# Patient Record
Sex: Female | Born: 1970
Health system: Southern US, Community
[De-identification: ages and names within clinical notes are randomized; demographics above are authoritative.]

## PROBLEM LIST (undated history)

## (undated) DIAGNOSIS — C50919 Malignant neoplasm of unspecified site of unspecified female breast: Secondary | ICD-10-CM

## (undated) DIAGNOSIS — Z9889 Other specified postprocedural states: Secondary | ICD-10-CM

## (undated) DIAGNOSIS — R112 Nausea with vomiting, unspecified: Secondary | ICD-10-CM

## (undated) DIAGNOSIS — T8859XA Other complications of anesthesia, initial encounter: Secondary | ICD-10-CM

## (undated) DIAGNOSIS — I10 Essential (primary) hypertension: Secondary | ICD-10-CM

## (undated) HISTORY — PX: MASTECTOMY: SHX3

## (undated) HISTORY — PX: CHOLECYSTECTOMY: SHX55

## (undated) HISTORY — DX: Malignant neoplasm of unspecified site of unspecified female breast: C50.919

---

## 1998-04-17 ENCOUNTER — Ambulatory Visit (HOSPITAL_COMMUNITY): Admission: RE | Admit: 1998-04-17 | Discharge: 1998-04-17 | Payer: Self-pay | Admitting: Obstetrics

## 1998-05-10 ENCOUNTER — Ambulatory Visit (HOSPITAL_COMMUNITY): Admission: RE | Admit: 1998-05-10 | Discharge: 1998-05-10 | Payer: Self-pay | Admitting: Obstetrics

## 1998-05-29 ENCOUNTER — Inpatient Hospital Stay (HOSPITAL_COMMUNITY): Admission: AD | Admit: 1998-05-29 | Discharge: 1998-05-29 | Payer: Self-pay | Admitting: Obstetrics

## 1998-07-24 ENCOUNTER — Inpatient Hospital Stay (HOSPITAL_COMMUNITY): Admission: AD | Admit: 1998-07-24 | Discharge: 1998-07-24 | Payer: Self-pay | Admitting: Obstetrics

## 1998-07-27 ENCOUNTER — Inpatient Hospital Stay (HOSPITAL_COMMUNITY): Admission: AD | Admit: 1998-07-27 | Discharge: 1998-07-29 | Payer: Self-pay | Admitting: Obstetrics

## 1998-08-01 ENCOUNTER — Inpatient Hospital Stay (HOSPITAL_COMMUNITY): Admission: AD | Admit: 1998-08-01 | Discharge: 1998-08-04 | Payer: Self-pay | Admitting: Obstetrics & Gynecology

## 1998-10-17 ENCOUNTER — Inpatient Hospital Stay (HOSPITAL_COMMUNITY): Admission: AD | Admit: 1998-10-17 | Discharge: 1998-10-17 | Payer: Self-pay | Admitting: Obstetrics & Gynecology

## 1998-11-05 ENCOUNTER — Encounter: Admission: RE | Admit: 1998-11-05 | Discharge: 1998-11-05 | Payer: Self-pay | Admitting: Obstetrics & Gynecology

## 1999-06-18 ENCOUNTER — Emergency Department (HOSPITAL_COMMUNITY): Admission: EM | Admit: 1999-06-18 | Discharge: 1999-06-18 | Payer: Self-pay | Admitting: Emergency Medicine

## 2001-09-24 ENCOUNTER — Emergency Department (HOSPITAL_COMMUNITY): Admission: EM | Admit: 2001-09-24 | Discharge: 2001-09-24 | Payer: Self-pay | Admitting: *Deleted

## 2001-10-03 ENCOUNTER — Emergency Department (HOSPITAL_COMMUNITY): Admission: EM | Admit: 2001-10-03 | Discharge: 2001-10-03 | Payer: Self-pay | Admitting: Emergency Medicine

## 2001-10-03 ENCOUNTER — Encounter: Payer: Self-pay | Admitting: Emergency Medicine

## 2002-03-09 ENCOUNTER — Inpatient Hospital Stay (HOSPITAL_COMMUNITY): Admission: AD | Admit: 2002-03-09 | Discharge: 2002-03-09 | Payer: Self-pay | Admitting: Obstetrics

## 2002-04-01 ENCOUNTER — Inpatient Hospital Stay (HOSPITAL_COMMUNITY): Admission: AD | Admit: 2002-04-01 | Discharge: 2002-04-03 | Payer: Self-pay | Admitting: *Deleted

## 2002-04-01 ENCOUNTER — Encounter: Payer: Self-pay | Admitting: *Deleted

## 2005-07-12 ENCOUNTER — Emergency Department (HOSPITAL_COMMUNITY): Admission: EM | Admit: 2005-07-12 | Discharge: 2005-07-12 | Payer: Self-pay | Admitting: Family Medicine

## 2005-10-21 ENCOUNTER — Emergency Department (HOSPITAL_COMMUNITY): Admission: EM | Admit: 2005-10-21 | Discharge: 2005-10-21 | Payer: Self-pay | Admitting: Emergency Medicine

## 2006-04-03 ENCOUNTER — Emergency Department (HOSPITAL_COMMUNITY): Admission: EM | Admit: 2006-04-03 | Discharge: 2006-04-03 | Payer: Self-pay | Admitting: Emergency Medicine

## 2006-10-11 ENCOUNTER — Inpatient Hospital Stay (HOSPITAL_COMMUNITY): Admission: AD | Admit: 2006-10-11 | Discharge: 2006-10-11 | Payer: Self-pay | Admitting: Obstetrics and Gynecology

## 2006-10-27 ENCOUNTER — Inpatient Hospital Stay (HOSPITAL_COMMUNITY): Admission: AD | Admit: 2006-10-27 | Discharge: 2006-10-27 | Payer: Self-pay | Admitting: Gynecology

## 2006-12-04 ENCOUNTER — Inpatient Hospital Stay (HOSPITAL_COMMUNITY): Admission: AD | Admit: 2006-12-04 | Discharge: 2006-12-04 | Payer: Self-pay | Admitting: Obstetrics

## 2007-03-07 ENCOUNTER — Inpatient Hospital Stay (HOSPITAL_COMMUNITY): Admission: AD | Admit: 2007-03-07 | Discharge: 2007-03-07 | Payer: Self-pay | Admitting: Obstetrics

## 2007-04-25 ENCOUNTER — Inpatient Hospital Stay (HOSPITAL_COMMUNITY): Admission: AD | Admit: 2007-04-25 | Discharge: 2007-04-26 | Payer: Self-pay | Admitting: Obstetrics

## 2007-05-21 ENCOUNTER — Inpatient Hospital Stay (HOSPITAL_COMMUNITY): Admission: AD | Admit: 2007-05-21 | Discharge: 2007-05-23 | Payer: Self-pay | Admitting: Obstetrics

## 2007-12-09 ENCOUNTER — Emergency Department (HOSPITAL_COMMUNITY): Admission: EM | Admit: 2007-12-09 | Discharge: 2007-12-09 | Payer: Self-pay | Admitting: Emergency Medicine

## 2008-01-05 ENCOUNTER — Ambulatory Visit: Payer: Self-pay | Admitting: Internal Medicine

## 2008-01-05 DIAGNOSIS — R079 Chest pain, unspecified: Secondary | ICD-10-CM | POA: Insufficient documentation

## 2008-01-05 LAB — CONVERTED CEMR LAB
Bilirubin Urine: NEGATIVE
Glucose, Urine, Semiquant: NEGATIVE
Ketones, urine, test strip: NEGATIVE
Nitrite: NEGATIVE
Protein, U semiquant: 30
Specific Gravity, Urine: >=1.03
Urobilinogen, UA: NEGATIVE
WBC Urine, dipstick: NEGATIVE
pH: 6

## 2008-01-08 LAB — CONVERTED CEMR LAB
BUN: 12 mg/dL (ref 6–23)
CO2: 23 meq/L (ref 19–32)
Calcium: 9.8 mg/dL (ref 8.4–10.5)
Chloride: 102 meq/L (ref 96–112)
Creatinine, Ser: 0.54 mg/dL (ref 0.40–1.20)
Glucose, Bld: 89 mg/dL (ref 70–99)
Potassium: 4.2 meq/L (ref 3.5–5.3)
Sodium: 139 meq/L (ref 135–145)
TSH: 1.147 microintl units/mL (ref 0.350–5.50)

## 2008-01-09 ENCOUNTER — Encounter (INDEPENDENT_AMBULATORY_CARE_PROVIDER_SITE_OTHER): Payer: Self-pay | Admitting: *Deleted

## 2008-01-19 ENCOUNTER — Ambulatory Visit: Payer: Self-pay | Admitting: Internal Medicine

## 2008-01-19 ENCOUNTER — Ambulatory Visit: Payer: Self-pay | Admitting: *Deleted

## 2008-02-02 ENCOUNTER — Ambulatory Visit: Payer: Self-pay | Admitting: Internal Medicine

## 2008-02-16 ENCOUNTER — Ambulatory Visit: Payer: Self-pay | Admitting: Internal Medicine

## 2008-03-19 ENCOUNTER — Ambulatory Visit: Payer: Self-pay | Admitting: Internal Medicine

## 2008-03-19 DIAGNOSIS — I10 Essential (primary) hypertension: Secondary | ICD-10-CM | POA: Insufficient documentation

## 2008-04-02 ENCOUNTER — Telehealth (INDEPENDENT_AMBULATORY_CARE_PROVIDER_SITE_OTHER): Payer: Self-pay | Admitting: Internal Medicine

## 2008-06-12 ENCOUNTER — Emergency Department (HOSPITAL_COMMUNITY): Admission: EM | Admit: 2008-06-12 | Discharge: 2008-06-12 | Payer: Self-pay | Admitting: Emergency Medicine

## 2008-06-13 ENCOUNTER — Telehealth (INDEPENDENT_AMBULATORY_CARE_PROVIDER_SITE_OTHER): Payer: Self-pay | Admitting: *Deleted

## 2008-06-14 ENCOUNTER — Ambulatory Visit: Payer: Self-pay | Admitting: Internal Medicine

## 2008-07-24 ENCOUNTER — Ambulatory Visit: Payer: Self-pay | Admitting: Internal Medicine

## 2008-07-24 DIAGNOSIS — R1013 Epigastric pain: Secondary | ICD-10-CM | POA: Insufficient documentation

## 2008-08-31 ENCOUNTER — Encounter (INDEPENDENT_AMBULATORY_CARE_PROVIDER_SITE_OTHER): Payer: Self-pay | Admitting: *Deleted

## 2008-12-11 ENCOUNTER — Telehealth (INDEPENDENT_AMBULATORY_CARE_PROVIDER_SITE_OTHER): Payer: Self-pay | Admitting: *Deleted

## 2008-12-24 ENCOUNTER — Ambulatory Visit: Payer: Self-pay | Admitting: Nurse Practitioner

## 2008-12-24 DIAGNOSIS — R519 Headache, unspecified: Secondary | ICD-10-CM | POA: Insufficient documentation

## 2008-12-24 DIAGNOSIS — R51 Headache: Secondary | ICD-10-CM

## 2009-01-17 ENCOUNTER — Ambulatory Visit: Payer: Self-pay | Admitting: Internal Medicine

## 2009-01-17 DIAGNOSIS — F341 Dysthymic disorder: Secondary | ICD-10-CM

## 2009-01-17 DIAGNOSIS — J019 Acute sinusitis, unspecified: Secondary | ICD-10-CM

## 2009-04-06 ENCOUNTER — Emergency Department (HOSPITAL_COMMUNITY): Admission: EM | Admit: 2009-04-06 | Discharge: 2009-04-06 | Payer: Self-pay | Admitting: Family Medicine

## 2009-04-15 ENCOUNTER — Emergency Department (HOSPITAL_COMMUNITY): Admission: EM | Admit: 2009-04-15 | Discharge: 2009-04-15 | Payer: Self-pay | Admitting: Emergency Medicine

## 2009-06-25 ENCOUNTER — Ambulatory Visit: Payer: Self-pay | Admitting: Nurse Practitioner

## 2009-06-25 DIAGNOSIS — L708 Other acne: Secondary | ICD-10-CM

## 2009-06-26 ENCOUNTER — Encounter (INDEPENDENT_AMBULATORY_CARE_PROVIDER_SITE_OTHER): Payer: Self-pay | Admitting: Nurse Practitioner

## 2009-06-26 DIAGNOSIS — R809 Proteinuria, unspecified: Secondary | ICD-10-CM

## 2009-06-26 LAB — CONVERTED CEMR LAB
ALT: 12 units/L (ref 0–35)
AST: 17 units/L (ref 0–37)
Albumin: 4.4 g/dL (ref 3.5–5.2)
Alkaline Phosphatase: 74 units/L (ref 39–117)
BUN: 10 mg/dL (ref 6–23)
Basophils Absolute: 0 10*3/uL (ref 0.0–0.1)
Basophils Relative: 1 % (ref 0–1)
CO2: 23 meq/L (ref 19–32)
Calcium: 9.5 mg/dL (ref 8.4–10.5)
Chloride: 105 meq/L (ref 96–112)
Cholesterol: 199 mg/dL (ref 0–200)
Creatinine, Ser: 0.66 mg/dL (ref 0.40–1.20)
Creatinine, Urine: 279.2 mg/dL
Eosinophils Absolute: 0 10*3/uL (ref 0.0–0.7)
Eosinophils Relative: 1 % (ref 0–5)
Glucose, Bld: 92 mg/dL (ref 70–99)
HCT: 42.9 % (ref 36.0–46.0)
HDL: 40 mg/dL (ref >39)
Hemoglobin: 13.8 g/dL (ref 12.0–15.0)
LDL Cholesterol: 128 mg/dL — ABNORMAL HIGH (ref 0–99)
Lymphocytes Relative: 40 % (ref 12–46)
Lymphs Abs: 2.5 10*3/uL (ref 0.7–4.0)
MCHC: 32.2 g/dL (ref 30.0–36.0)
MCV: 87.7 fL (ref 78.0–100.0)
Microalb Creat Ratio: 19.1 mg/g (ref 0.0–30.0)
Microalb, Ur: 5.34 mg/dL — ABNORMAL HIGH (ref 0.00–1.89)
Monocytes Absolute: 0.5 10*3/uL (ref 0.1–1.0)
Monocytes Relative: 7 % (ref 3–12)
Neutro Abs: 3.1 10*3/uL (ref 1.7–7.7)
Neutrophils Relative %: 51 % (ref 43–77)
Platelets: 331 10*3/uL (ref 150–400)
Potassium: 4.1 meq/L (ref 3.5–5.3)
RBC: 4.89 M/uL (ref 3.87–5.11)
RDW: 13.5 % (ref 11.5–15.5)
Sodium: 141 meq/L (ref 135–145)
TSH: 1.526 microintl units/mL (ref 0.350–4.500)
Total Bilirubin: 0.5 mg/dL (ref 0.3–1.2)
Total CHOL/HDL Ratio: 5
Total Protein: 7.4 g/dL (ref 6.0–8.3)
Triglycerides: 154 mg/dL — ABNORMAL HIGH (ref <150)
VLDL: 31 mg/dL (ref 0–40)
WBC: 6.1 10*3/uL (ref 4.0–10.5)

## 2009-07-10 ENCOUNTER — Ambulatory Visit: Payer: Self-pay | Admitting: Nurse Practitioner

## 2009-09-09 ENCOUNTER — Telehealth (INDEPENDENT_AMBULATORY_CARE_PROVIDER_SITE_OTHER): Payer: Self-pay | Admitting: Internal Medicine

## 2009-10-04 ENCOUNTER — Ambulatory Visit: Payer: Self-pay | Admitting: Internal Medicine

## 2009-10-04 DIAGNOSIS — L659 Nonscarring hair loss, unspecified: Secondary | ICD-10-CM | POA: Insufficient documentation

## 2009-10-04 DIAGNOSIS — R42 Dizziness and giddiness: Secondary | ICD-10-CM

## 2009-10-18 ENCOUNTER — Telehealth (INDEPENDENT_AMBULATORY_CARE_PROVIDER_SITE_OTHER): Payer: Self-pay | Admitting: Internal Medicine

## 2010-01-29 ENCOUNTER — Encounter (INDEPENDENT_AMBULATORY_CARE_PROVIDER_SITE_OTHER): Payer: Self-pay | Admitting: Internal Medicine

## 2010-11-30 ENCOUNTER — Inpatient Hospital Stay (HOSPITAL_COMMUNITY): Admission: EM | Admit: 2010-11-30 | Discharge: 2010-12-03 | Payer: Self-pay | Source: Home / Self Care

## 2010-12-01 LAB — COMPREHENSIVE METABOLIC PANEL
ALT: 822 U/L — ABNORMAL HIGH (ref 0–35)
AST: 1148 U/L — ABNORMAL HIGH (ref 0–37)
Albumin: 3.5 g/dL (ref 3.5–5.2)
Alkaline Phosphatase: 137 U/L — ABNORMAL HIGH (ref 39–117)
BUN: 10 mg/dL (ref 6–23)
CO2: 27 mEq/L (ref 19–32)
Calcium: 9.1 mg/dL (ref 8.4–10.5)
Chloride: 104 mEq/L (ref 96–112)
Creatinine, Ser: 0.73 mg/dL (ref 0.4–1.2)
GFR calc Af Amer: >60 mL/min (ref >60)
GFR calc non Af Amer: >60 mL/min (ref >60)
Glucose, Bld: 114 mg/dL — ABNORMAL HIGH (ref 70–99)
Potassium: 3.4 mEq/L — ABNORMAL LOW (ref 3.5–5.1)
Sodium: 139 mEq/L (ref 135–145)
Total Bilirubin: 2.4 mg/dL — ABNORMAL HIGH (ref 0.3–1.2)
Total Protein: 6.9 g/dL (ref 6.0–8.3)

## 2010-12-01 LAB — CBC
HCT: 37.4 % (ref 36.0–46.0)
Hemoglobin: 12.5 g/dL (ref 12.0–15.0)
MCH: 28.6 pg (ref 26.0–34.0)
MCHC: 33.4 g/dL (ref 30.0–36.0)
MCV: 85.6 fL (ref 78.0–100.0)
Platelets: 68 10*3/uL — ABNORMAL LOW (ref 150–400)
RBC: 4.37 MIL/uL (ref 3.87–5.11)
RDW: 12.6 % (ref 11.5–15.5)
WBC: 3.5 10*3/uL — ABNORMAL LOW (ref 4.0–10.5)

## 2010-12-01 LAB — DIFFERENTIAL
Basophils Absolute: 0 10*3/uL (ref 0.0–0.1)
Basophils Relative: 1 % (ref 0–1)
Eosinophils Absolute: 0.1 10*3/uL (ref 0.0–0.7)
Eosinophils Relative: 2 % (ref 0–5)
Lymphocytes Relative: 46 % (ref 12–46)
Lymphs Abs: 1.6 10*3/uL (ref 0.7–4.0)
Monocytes Absolute: 0.2 10*3/uL (ref 0.1–1.0)
Monocytes Relative: 5 % (ref 3–12)
Neutro Abs: 1.6 10*3/uL — ABNORMAL LOW (ref 1.7–7.7)
Neutrophils Relative %: 47 % (ref 43–77)

## 2010-12-01 LAB — LACTIC ACID, PLASMA: Lactic Acid, Venous: 1.1 mmol/L (ref 0.5–2.2)

## 2010-12-01 LAB — POCT PREGNANCY, URINE: Preg Test, Ur: NEGATIVE

## 2010-12-01 LAB — LIPASE, BLOOD: Lipase: 30 U/L (ref 11–59)

## 2010-12-02 ENCOUNTER — Encounter (INDEPENDENT_AMBULATORY_CARE_PROVIDER_SITE_OTHER): Payer: Self-pay

## 2010-12-03 LAB — URINALYSIS, ROUTINE W REFLEX MICROSCOPIC
Ketones, ur: NEGATIVE mg/dL
Leukocytes, UA: NEGATIVE
Nitrite: NEGATIVE
Protein, ur: NEGATIVE mg/dL
Specific Gravity, Urine: 1.01 (ref 1.005–1.030)
Urine Glucose, Fasting: NEGATIVE mg/dL
Urobilinogen, UA: 1 mg/dL (ref 0.0–1.0)
pH: 6.5 (ref 5.0–8.0)

## 2010-12-03 LAB — URINE MICROSCOPIC-ADD ON

## 2010-12-03 LAB — CBC
HCT: 36.5 % (ref 36.0–46.0)
Hemoglobin: 12.3 g/dL (ref 12.0–15.0)
MCH: 28.7 pg (ref 26.0–34.0)
MCHC: 33.7 g/dL (ref 30.0–36.0)
MCV: 85.1 fL (ref 78.0–100.0)
Platelets: 63 10*3/uL — ABNORMAL LOW (ref 150–400)
RBC: 4.29 MIL/uL (ref 3.87–5.11)
RDW: 12.8 % (ref 11.5–15.5)
WBC: 4 10*3/uL (ref 4.0–10.5)

## 2010-12-03 LAB — HEPATIC FUNCTION PANEL
ALT: 726 U/L — ABNORMAL HIGH (ref 0–35)
AST: 658 U/L — ABNORMAL HIGH (ref 0–37)
Albumin: 3.2 g/dL — ABNORMAL LOW (ref 3.5–5.2)
Alkaline Phosphatase: 145 U/L — ABNORMAL HIGH (ref 39–117)
Bilirubin, Direct: 0.6 mg/dL — ABNORMAL HIGH (ref 0.0–0.3)
Indirect Bilirubin: 0.9 mg/dL (ref 0.3–0.9)
Total Bilirubin: 1.5 mg/dL — ABNORMAL HIGH (ref 0.3–1.2)
Total Protein: 6.4 g/dL (ref 6.0–8.3)

## 2010-12-06 NOTE — H&P (Signed)
  NAMEHAVAH, AMMON NO.:  [id number]  MEDICAL RECORD NO.:  [id number]          PATIENT TYPE:  EMS  LOCATION:  MAJO                         FACILITY:  [facility]  PHYSICIAN:  Juanetta Gosling, MDDATE OF BIRTH:  November 22, 1970  DATE OF ADMISSION:  11/30/2010 DATE OF DISCHARGE:                             HISTORY & PHYSICAL   CHIEF COMPLAINT:  Epigastric pain.  HISTORY OF PRESENT ILLNESS:  This is a 39 year old female with about 24 hours of worsening epigastric pain associated with nausea, vomiting, no fevers.  This has not really relieved by anything.  She has not been eating so much over this time-frame.  It is aggravated by movement and palpation.  She has a history of occasional episodes over the past 3 years, but this one has been the most tense and the most long lasting. She has no history of any symptoms of jaundice.  PAST MEDICAL HISTORY:  Hypertension.  PAST SURGICAL HISTORY:  Negative. FAMILY HISTORY:  Noncontributory.  SOCIAL HISTORY:  She does not drink or smoke.  ALLERGIES:  She has no known drug allergies.  MEDICATIONS:  Losartan/hydrochlorothiazide.  REVIEW OF SYSTEMS:  Otherwise negative.  PHYSICAL EXAMINATION:  VITAL SIGNS:  Temperature 98.1, pulse initially was 113, now 77, respirations 16, and blood pressure 133/79. GENERAL:  She is a well-appearing female in no distress. HEENT:  No scleral icterus. NECK:  Supple without any adenopathy. HEART:  Regular rate and rhythm. LUNGS:  Clear bilaterally. ABDOMEN: Soft.  She has a very minimal epigastric tenderness.  No Murphy sign and no other abdominal tenderness and is nondistended.  LABORATORY DATA:  Laboratory evaluation shows an HCG that is negative. Lipase of 30.  CMP showing a BUN of 10, a creatinine of 0.73, a glucose of 114, and potassium 3.4.  Alkaline phosphatase 137, AST 1148, ALT of 822, and total bilirubin of 2.4.  Venous lactate is 1.1.  CBC shows a white blood cell count of  3.5, hematocrit 37.4, and platelets of 68.  RADIOLOGIC DATA:  Initially, she had a plain film that showed some mildly dilated loops of small bowel.  CT scan showed a small amount of pericholecystic fluid with no other real significant abnormalities except the left ovarian cyst.  Ultrasound of her right upper quadrant showed gallstones, gallbladder wall thickening of 5.4 mm, no sonographic Murphy sign, and a normal caliber common bile duct.  ASSESSMENT:  Choledocholithiasis.  PLAN:  Admission IV fluids n.p.o.  I am going to recheck her LFTs in the morning.  I have discussed with her if her LFTs are the same or it is increasing does make it appear that the stone has not passed and we will obtain a Gastroenterology consult for possible ERCP.  If her liver function tests are decreased, it appears that the stone is passed.  I discussed the laparoscopic cholecystectomy with her.     Juanetta Gosling, MD     MCW/MEDQ  D:  12/01/2010  T:  12/01/2010  Job:  [id number]  Electronically Signed by Emelia Loron MD on 12/06/2010 01:57:52 PM

## 2010-12-08 LAB — CBC
HCT: 33.1 % — ABNORMAL LOW (ref 36.0–46.0)
Hemoglobin: 11.1 g/dL — ABNORMAL LOW (ref 12.0–15.0)
MCH: 28.5 pg (ref 26.0–34.0)
MCHC: 33.5 g/dL (ref 30.0–36.0)
MCV: 85.1 fL (ref 78.0–100.0)
Platelets: 63 10*3/uL — ABNORMAL LOW (ref 150–400)
RBC: 3.89 MIL/uL (ref 3.87–5.11)
RDW: 12.7 % (ref 11.5–15.5)
WBC: 7.7 10*3/uL (ref 4.0–10.5)

## 2010-12-12 NOTE — Op Note (Signed)
Yvette Franklin, Yvette Franklin                 ACCOUNT NO.:  [id number]  MEDICAL RECORD NO.:  [id number]          PATIENT TYPE:  INP  LOCATION:  5118                         FACILITY:  [facility]  PHYSICIAN:  Cherylynn Ridges, M.D.    DATE OF BIRTH:  February 23, 1971  DATE OF PROCEDURE:  12/02/2010 DATE OF DISCHARGE:                              OPERATIVE REPORT   PREOPERATIVE DIAGNOSES:  Cholelithiasis, possible acute cholecystitis, and possible choledocholithiasis.  POSTOPERATIVE DIAGNOSES:  Acute cholecystitis and cholelithiasis.  PROCEDURE:  Laparoscopic cholecystectomy with cholangiogram.  SURGEON:  Cherylynn Ridges, MD  ASSISTANT:  Letha Cape, PA  ANESTHESIA:  General endotracheal.  ESTIMATED BLOOD LOSS:  50 mL.  COMPLICATIONS:  None.  CONDITION:  Stable.  FINDINGS:  Evidence of acute cholecystitis with dense omental adhesions to the gallbladder body and infundibulum.  INDICATIONS FOR OPERATION:  The patient is a 40 year old with abdominal pain, markedly elevated LFTs which had subsided over last 2 days who now comes in for a laparoscopic cholecystectomy.  OPERATION:  The patient was taken to the operating room, placed on table in supine position.  After an adequate general endotracheal anesthetic was administered, she was prepped and draped in usual sterile manner exposing the entire abdomen.  A supraumbilical midline incision was made using #15 blade and taken down to the midline fascia.  We grabbed the fascia with Kocher clamps and then made an incision between the clamps using a 15 blade into the preperitoneal space.  We then bluntly dissected through the preperitoneal space into the peritoneal cavity with a Kelly clamp, then passed a pursestring suture of 0 Vicryl around the fascial opening into the peritoneal cavity.  We had secured in a Hasson cannula which subsequently passed into the peritoneal cavity through which carbon dioxide gas was insufflated up to a maximal  intra-abdominal pressure of 15 mmHg.  Once this was done, 2 right costal margin 5-mm cannulas and the subxiphoid 5-mm cannula passed under direct vision.  The patient was placed in reverse Trendelenburg and left side was tilted down.  We grabbed the gallbladder initially on the dome, but there were some dense omental adhesions which had to be dissected away.  Most of the bleeding perioperatively was from these omental adhesions.  We used electrocautery with a [place] dissector to dissect away these adhesions.  However, there still was some bleeding at the end which subsided.  We were able to free up the infundibulum and the body of the gallbladder enough in order to dissect out the peritoneum overlying the triangle of Fallot and hepatic duodenal triangle dissecting out the cystic duct and the cystic artery.  Once we had the duct identified, we placed a clip along the gallbladder side, made a cholecystodochotomy using laparoscopic scissors, then performed cholangiogram using Cook catheter which had been passed through the anterior abdominal wall.  The cholangiogram showed good flow into the duodenum, no intraductal filling defects, no ductal dilatation, and good proximal flow.  Once the cholangiogram was completed, the clip securing the catheter in place was removed and we distally clipped the cystic duct x3 and transected it.  The  cystic artery was identified and clipped proximally and distally and also cauterized.  We dissected the gallbladder out of its bed with minimal difficulty removing it from the supraumbilical fascial site using an EndoCatch bag.  We reinserted the Hasson cannula and inspected the bed for bleeding. There was no bleeding from the bed but there was bleeding in the gallbladder fossa from the adhesions that had been taken down.  There was no pinpoint sign of bleeding.  We irrigated with about 3 liters of saline solution until the fluid was clear and again there  was no active bleeding noted.  Once this was completed and we had adequately irrigated and found there to be no bleeding, removed all cannulas, aspirated all fluid and gas from above the liver and a removed all cannulas.  The supraumbilical fascial site was closed using a pursestring suture which was in place.  A 0.25% Marcaine with epi was injected at all sites.  The three 5-mm cannula sites were closed using Dermabond, Steri- Strips, and Tegaderm.  The supraumbilical skin was closed using running subcuticular stitch of 4-0 Monocryl followed by Dermabond, Steri-Strips, and Tegaderm.  All needle count, sponge counts, and instrument counts were correct.     Cherylynn Ridges, M.D.     JOW/MEDQ  D:  12/02/2010  T:  12/03/2010  Job:  [id number]  Electronically Signed by Jimmye Norman M.D. on 12/10/2010 08:11:50 AM

## 2010-12-12 NOTE — Discharge Summary (Signed)
Yvette Franklin, Yvette Franklin                 ACCOUNT NO.:  [id number]  MEDICAL RECORD NO.:  [id number]           PATIENT TYPE:  LOCATION:                                 FACILITY:  PHYSICIAN:  Cherylynn Ridges, M.D.    DATE OF BIRTH:  [birth year]  DATE OF ADMISSION:  12/01/2010 DATE OF DISCHARGE:  12/03/2010                        DISCHARGE SUMMARY - REFERRING   ADMISSION DIAGNOSIS:  Choledocholithiasis with elevated LFTs.  DISCHARGE DIAGNOSES: 1. Acute cholecystitis with cholelithiasis. 2. Hypertension.  PROCEDURES: 1. CT of abdomen and pelvis December 01, 2010 showing pericholecystic     fluid and left ovarian cyst. 2. Gallbladder ultrasound showing gallstones and gallbladder wall     thickening up to 5.4 mm with positive Murphy sign. 3. Status post laparoscopic cholecystectomy and intraoperative     cholangiogram December 02, 2010.  BRIEF HISTORY:  The patient is a 40 year old lady from [place] with worsening epigastric pain and nausea and vomiting, fever.  Nothing has really relieved her symptoms.  She has not been eating much, is aggravated by movement and palpitation.  She has had episodes on and off over the last 3 years.  This one has been the most severe.  She has no history of jaundice.  PAST MEDICAL HISTORY:  Hypertension.  FAMILY HISTORY:  None.  SOCIAL HISTORY:  She does not drink or smoke.  ALLERGIES:  None known.  MEDICATIONS:  Losartan hydrochlorothiazide one daily.  For further history and physical, please see the admission note.  LABS ON ADMISSION:  Shows white count was normal 3.5, hemoglobin 12.5, hematocrit 37, platelets were 68,000.  Lactic acid was 1.1.  CMP showed electrolytes were normal except for potassium slightly diminished at 3.4, total bilirubin was 2.4, alk phos slightly elevated at 137.  SGOT was 1148, SGPT was 822, albumin and protein were normal.  Lipase was normal at 30.  Urinalysis was normal. Hospital Course:  The patient was placed  on antibiotics and LFTs the following day showed alk-phos at 145.  SGOT was down to 658, SGPT was 726.  She was taken to the operating room in the afternoon of December 02, 2010, underwent laparoscopic cholecystectomy without problems.  She had been maintained on antibiotics postop, was making good progress.  If she continues to improve, we anticipate discharge later today after lunch.  Her wounds were closed and healing nicely.  She is ambulating without difficulty. Discharge instructions:  She will go home on her preadmission medicines losartan/hydrochlorothiazide 100/12.5 one daily, plain Tylenol 650 mg q.6 hours p.r.n. for pain, Vicodin 5/325 1-2 tablets q.4 h p.r.n. for pain.  Patient instructed not to take more than 6 tablets per day. CBC was checked in PM at Dr. Lavella Lemons direction and was stable.  She was DC'ed in PM 12/03/10.  She will return to the clinic on December 16, 2010, at 2:45 p.m.  CONDITION ON DISCHARGE:  Improved.     Eber Hong, P.A.   ______________________________ Cherylynn Ridges, M.D.    WDJ/MEDQ  D:  12/03/2010  T:  12/03/2010  Job:  [id number]  cc:   Melvern Banker  Electronically Signed by  Sherrie George P.A. on 12/05/2010 03:06:17 PM Electronically Signed by Jimmye Norman M.D. on 12/10/2010 08:11:59 AM

## 2010-12-19 NOTE — Letter (Signed)
Summary: Internal Correspondence  Internal Correspondence   Imported By: Arta Bruce 03/05/2010 12:05:49  _____________________________________________________________________  External Attachment:    Type:   Image     Comment:   External Document

## 2011-01-12 ENCOUNTER — Encounter (INDEPENDENT_AMBULATORY_CARE_PROVIDER_SITE_OTHER): Payer: Self-pay | Admitting: Internal Medicine

## 2011-01-22 NOTE — Miscellaneous (Signed)
Summary: Cholecystectomy documentation.  Clinical Lists Changes  Observations: Added new observation of PAST SURG HX: 1.  12/02/2010:  laparoscopic cholecystectomy, Dr. Lindie Spruce. (01/12/2011 11:29)        Past History:  Past Surgical History: 1.  12/02/2010:  laparoscopic cholecystectomy, Dr. Lindie Spruce.

## 2011-06-21 ENCOUNTER — Emergency Department (HOSPITAL_COMMUNITY)
Admission: EM | Admit: 2011-06-21 | Discharge: 2011-06-21 | Disposition: A | Discharge status: Home / Self Care | Payer: Self-pay | Attending: Emergency Medicine | Admitting: Emergency Medicine

## 2011-06-21 DIAGNOSIS — K2970 Gastritis, unspecified, without bleeding: Secondary | ICD-10-CM | POA: Insufficient documentation

## 2011-06-21 DIAGNOSIS — R112 Nausea with vomiting, unspecified: Secondary | ICD-10-CM | POA: Insufficient documentation

## 2011-06-21 DIAGNOSIS — R10816 Epigastric abdominal tenderness: Secondary | ICD-10-CM | POA: Insufficient documentation

## 2011-06-21 DIAGNOSIS — R197 Diarrhea, unspecified: Secondary | ICD-10-CM | POA: Insufficient documentation

## 2011-06-21 DIAGNOSIS — R109 Unspecified abdominal pain: Secondary | ICD-10-CM | POA: Insufficient documentation

## 2011-06-21 DIAGNOSIS — I10 Essential (primary) hypertension: Secondary | ICD-10-CM | POA: Insufficient documentation

## 2011-06-21 DIAGNOSIS — Z79899 Other long term (current) drug therapy: Secondary | ICD-10-CM | POA: Insufficient documentation

## 2011-06-21 LAB — URINALYSIS, ROUTINE W REFLEX MICROSCOPIC
Bilirubin Urine: NEGATIVE
Glucose, UA: NEGATIVE mg/dL
Ketones, ur: NEGATIVE mg/dL
Nitrite: NEGATIVE
Protein, ur: NEGATIVE mg/dL
Specific Gravity, Urine: 1.034 — ABNORMAL HIGH (ref 1.005–1.030)
Urobilinogen, UA: 0.2 mg/dL (ref 0.0–1.0)
pH: 5.5 (ref 5.0–8.0)

## 2011-06-21 LAB — DIFFERENTIAL
Basophils Absolute: 0.1 10*3/uL (ref 0.0–0.1)
Basophils Relative: 1 % (ref 0–1)
Eosinophils Absolute: 0.5 10*3/uL (ref 0.0–0.7)
Eosinophils Relative: 7 % — ABNORMAL HIGH (ref 0–5)

## 2011-06-21 LAB — COMPREHENSIVE METABOLIC PANEL
AST: 68 U/L — ABNORMAL HIGH (ref 0–37)
Albumin: 3.9 g/dL (ref 3.5–5.2)
Chloride: 102 mEq/L (ref 96–112)
Creatinine, Ser: 0.7 mg/dL (ref 0.50–1.10)
Potassium: 4 mEq/L (ref 3.5–5.1)
Sodium: 139 mEq/L (ref 135–145)
Total Bilirubin: 0.2 mg/dL — ABNORMAL LOW (ref 0.3–1.2)

## 2011-06-21 LAB — CBC
MCV: 85.7 fL (ref 78.0–100.0)
Platelets: 163 10*3/uL (ref 150–400)
RDW: 12.5 % (ref 11.5–15.5)
WBC: 7.7 10*3/uL (ref 4.0–10.5)

## 2011-06-21 LAB — URINE MICROSCOPIC-ADD ON

## 2011-06-21 LAB — POCT PREGNANCY, URINE: Preg Test, Ur: NEGATIVE

## 2011-06-24 ENCOUNTER — Ambulatory Visit: Payer: Self-pay | Attending: Family Medicine | Admitting: Rehabilitation

## 2011-06-24 DIAGNOSIS — IMO0001 Reserved for inherently not codable concepts without codable children: Secondary | ICD-10-CM | POA: Insufficient documentation

## 2011-06-24 DIAGNOSIS — M542 Cervicalgia: Secondary | ICD-10-CM | POA: Insufficient documentation

## 2011-06-24 DIAGNOSIS — R293 Abnormal posture: Secondary | ICD-10-CM | POA: Insufficient documentation

## 2011-07-01 ENCOUNTER — Ambulatory Visit: Payer: Self-pay | Admitting: Physical Therapy

## 2011-07-07 ENCOUNTER — Ambulatory Visit: Payer: Self-pay | Admitting: Physical Therapy

## 2011-07-09 ENCOUNTER — Ambulatory Visit: Payer: Self-pay | Admitting: Physical Therapy

## 2011-07-14 ENCOUNTER — Encounter: Payer: Self-pay | Admitting: Physical Therapy

## 2011-07-14 ENCOUNTER — Ambulatory Visit: Payer: Self-pay | Admitting: Physical Therapy

## 2011-07-16 ENCOUNTER — Encounter: Payer: Self-pay | Admitting: Physical Therapy

## 2011-07-16 ENCOUNTER — Ambulatory Visit: Payer: Self-pay | Admitting: Physical Therapy

## 2011-07-21 ENCOUNTER — Ambulatory Visit: Payer: Self-pay | Attending: Family Medicine | Admitting: Physical Therapy

## 2011-07-21 ENCOUNTER — Encounter: Payer: Self-pay | Admitting: Physical Therapy

## 2011-07-21 DIAGNOSIS — R293 Abnormal posture: Secondary | ICD-10-CM | POA: Insufficient documentation

## 2011-07-21 DIAGNOSIS — IMO0001 Reserved for inherently not codable concepts without codable children: Secondary | ICD-10-CM | POA: Insufficient documentation

## 2011-07-21 DIAGNOSIS — M542 Cervicalgia: Secondary | ICD-10-CM | POA: Insufficient documentation

## 2011-07-23 ENCOUNTER — Encounter: Payer: Self-pay | Admitting: Physical Therapy

## 2011-07-28 ENCOUNTER — Encounter: Payer: Self-pay | Admitting: Physical Therapy

## 2011-07-30 ENCOUNTER — Encounter: Payer: Self-pay | Admitting: Physical Therapy

## 2011-08-05 ENCOUNTER — Emergency Department (HOSPITAL_COMMUNITY)
Admission: EM | Admit: 2011-08-05 | Discharge: 2011-08-05 | Disposition: A | Discharge status: Home / Self Care | Payer: Self-pay | Attending: Emergency Medicine | Admitting: Emergency Medicine

## 2011-08-05 ENCOUNTER — Emergency Department (HOSPITAL_COMMUNITY): Payer: Self-pay

## 2011-08-05 DIAGNOSIS — S91309A Unspecified open wound, unspecified foot, initial encounter: Secondary | ICD-10-CM | POA: Insufficient documentation

## 2011-08-05 DIAGNOSIS — W268XXA Contact with other sharp object(s), not elsewhere classified, initial encounter: Secondary | ICD-10-CM | POA: Insufficient documentation

## 2011-08-06 LAB — CBC
HCT: 41
MCV: 85.8
Platelets: 307
WBC: 8.3

## 2011-08-06 LAB — POCT PREGNANCY, URINE
Operator id: 239701
Preg Test, Ur: NEGATIVE

## 2011-08-06 LAB — DIFFERENTIAL
Eosinophils Absolute: 0.1
Eosinophils Relative: 1
Lymphs Abs: 3.1
Monocytes Relative: 6

## 2011-08-06 LAB — HEPATIC FUNCTION PANEL
Alkaline Phosphatase: 101
Bilirubin, Direct: <0.1
Total Bilirubin: 0.5
Total Protein: 7.6

## 2011-08-06 LAB — URINE CULTURE

## 2011-08-06 LAB — POCT URINALYSIS DIP (DEVICE)
Nitrite: NEGATIVE
Protein, ur: 30 — AB
pH: 6

## 2011-08-06 LAB — I-STAT 8, (EC8 V) (CONVERTED LAB)
Acid-Base Excess: 3 — ABNORMAL HIGH
TCO2: 31
pCO2, Ven: 52.6 — ABNORMAL HIGH
pH, Ven: 7.358 — ABNORMAL HIGH

## 2011-08-06 LAB — URINALYSIS, MICROSCOPIC ONLY
Bilirubin Urine: NEGATIVE
Glucose, UA: NEGATIVE
Ketones, ur: NEGATIVE
Protein, ur: NEGATIVE

## 2011-08-06 LAB — LIPASE, BLOOD: Lipase: 24

## 2011-08-06 LAB — POCT I-STAT CREATININE: Operator id: 239701

## 2011-09-01 LAB — CBC
HCT: 32.3 — ABNORMAL LOW
HCT: 32.4 — ABNORMAL LOW
Platelets: 223
Platelets: 227
RDW: 15.5 — ABNORMAL HIGH
WBC: 10.5
WBC: 7

## 2011-09-01 LAB — RPR TITER: RPR Titer: 1:1 {titer}

## 2011-09-01 LAB — RPR: RPR Ser Ql: REACTIVE — AB

## 2011-09-01 LAB — RH IMMUNE GLOB WKUP(>/=20WKS)(NOT WOMEN'S HOSP): Fetal Screen: NEGATIVE

## 2011-09-01 LAB — RAPID HIV SCREEN (WH-MAU): Rapid HIV Screen: NONREACTIVE

## 2013-05-21 ENCOUNTER — Emergency Department (HOSPITAL_BASED_OUTPATIENT_CLINIC_OR_DEPARTMENT_OTHER)
Admission: EM | Admit: 2013-05-21 | Discharge: 2013-05-21 | Disposition: A | Discharge status: Home / Self Care | Payer: Self-pay | Attending: Emergency Medicine | Admitting: Emergency Medicine

## 2013-05-21 ENCOUNTER — Emergency Department (HOSPITAL_BASED_OUTPATIENT_CLINIC_OR_DEPARTMENT_OTHER): Payer: Self-pay

## 2013-05-21 ENCOUNTER — Encounter (HOSPITAL_BASED_OUTPATIENT_CLINIC_OR_DEPARTMENT_OTHER): Payer: Self-pay | Admitting: *Deleted

## 2013-05-21 DIAGNOSIS — R0989 Other specified symptoms and signs involving the circulatory and respiratory systems: Secondary | ICD-10-CM | POA: Insufficient documentation

## 2013-05-21 DIAGNOSIS — J159 Unspecified bacterial pneumonia: Secondary | ICD-10-CM | POA: Insufficient documentation

## 2013-05-21 DIAGNOSIS — I10 Essential (primary) hypertension: Secondary | ICD-10-CM | POA: Insufficient documentation

## 2013-05-21 DIAGNOSIS — J189 Pneumonia, unspecified organism: Secondary | ICD-10-CM

## 2013-05-21 DIAGNOSIS — R0789 Other chest pain: Secondary | ICD-10-CM | POA: Insufficient documentation

## 2013-05-21 HISTORY — DX: Essential (primary) hypertension: I10

## 2013-05-21 LAB — BASIC METABOLIC PANEL
Calcium: 9.7 mg/dL (ref 8.4–10.5)
Creatinine, Ser: 0.7 mg/dL (ref 0.50–1.10)
GFR calc non Af Amer: >90 mL/min (ref >90)
Glucose, Bld: 115 mg/dL — ABNORMAL HIGH (ref 70–99)
Sodium: 139 mEq/L (ref 135–145)

## 2013-05-21 LAB — CBC
MCH: 30.2 pg (ref 26.0–34.0)
MCHC: 34.9 g/dL (ref 30.0–36.0)
Platelets: 109 10*3/uL — ABNORMAL LOW (ref 150–400)

## 2013-05-21 LAB — TROPONIN I: Troponin I: <0.3 ng/mL (ref <0.30)

## 2013-05-21 MED ORDER — HYDROCOD POLST-CHLORPHEN POLST 10-8 MG/5ML PO LQCR: 5.0000 mL | Freq: Two times a day (BID) | ORAL | Status: DC | PRN

## 2013-05-21 MED ORDER — AZITHROMYCIN 250 MG PO TABS: 250.0000 mg | ORAL_TABLET | Freq: Every day | ORAL | Status: DC

## 2013-05-21 NOTE — ED Notes (Signed)
Pt reports productive cough x3 days w/ light green sputum, today pt began having chest heaviness and dizziness as well. Denies any known fever however states she feels hot. Pt appears mildly diaphoretic on assessment, resp even and unlabored, lung sounds clear and equal.

## 2013-05-21 NOTE — ED Provider Notes (Signed)
History    This chart was scribed for Geoffery Lyons, MD by Quintella Reichert, ED scribe.  This patient was seen in room MH12/MH12 and the patient's care was started at 8:19 PM.  CSN: [id number]  Arrival date & time 05/21/13  1911    Chief Complaint  Patient presents with  . Chest Pain  . Cough    The history is provided by the patient. No language interpreter was used.     HPI Comments: Yvette Franklin is a 42 y.o. female who presents to the Emergency Department complaining of 3-4 days of moderate intermittent productive cough, with accompanying chest heaviness that began today.  Cough is productive of greenish sputum.  She denies fever, abdominal pain, ankle swelling, or any other associated symptoms.  Pt denies h/o similar symptoms.  She denies recent sick contact with similar symptoms.  She admits to h/o HTN but denies any other chronic medical conditions.  She does not smoke.  There are no aggravating or alleviating factors.     No past medical history on file.   No past surgical history on file.  No family history on file.  History  Substance Use Topics  . Smoking status: Not on file  . Smokeless tobacco: Not on file  . Alcohol Use: Not on file   OB History   No data available       Review of Systems  All other systems reviewed and are negative.      Allergies  Review of patient's allergies indicates not on file.  Home Medications  No current outpatient prescriptions on file.  BP 140/83  Pulse 114  Temp(Src) 99.1 F (37.3 C) (Oral)  Resp 19  SpO2 95%  LMP 05/06/2013  Physical Exam  Nursing note and vitals reviewed. Constitutional: She is oriented to person, place, and time. She appears well-developed and well-nourished. No distress.  HENT:  Head: Normocephalic and atraumatic.  Eyes: EOM are normal.  Neck: Neck supple. No tracheal deviation present.  Cardiovascular: Normal rate, regular rhythm and normal heart sounds.   No murmur  heard. Pulmonary/Chest: Effort normal. No respiratory distress. She has no wheezes. She has rales.  Rales in the bases bilaterally  Abdominal: Soft. Bowel sounds are normal. There is no tenderness.  Musculoskeletal: Normal range of motion.  Neurological: She is alert and oriented to person, place, and time.  Skin: Skin is warm and dry.  Psychiatric: She has a normal mood and affect. Her behavior is normal.    ED Course  Procedures (including critical care time)  DIAGNOSTIC STUDIES: Oxygen Saturation is 95% on room air, adequate by my interpretation.    COORDINATION OF CARE: 8:22 PM- Informed pt that imaging revealed pneumonia.  Discussed treatment plan which includes antibiotics with pt at bedside and pt agreed to plan.     Labs Reviewed  CBC - Abnormal; Notable for the following:    Platelets 109 (*)    All other components within normal limits  BASIC METABOLIC PANEL - Abnormal; Notable for the following:    Potassium 3.2 (*)    Glucose, Bld 115 (*)    All other components within normal limits  TROPONIN I    Dg Chest 2 View  05/21/2013   *RADIOLOGY REPORT*  Clinical Data: Chest pain, cough.  CHEST - 2 VIEW  Comparison: None.  Findings: Areas of consolidation in the lower lobes bilaterally, left greater than right concerning for pneumonia.  No pleural effusions.  Heart is normal size.  No  acute bony abnormality.  IMPRESSION: Bilateral lower lobe consolidation, left greater than right concerning for multifocal pneumonia.   Original Report Authenticated By: Charlett Nose, M.D.    No diagnosis found.   Date: 05/21/2013  Rate: 107  Rhythm: sinus tachycardia  QRS Axis: normal  Intervals: normal  ST/T Wave abnormalities: normal  Conduction Disutrbances:none  Narrative Interpretation:   Old EKG Reviewed: none available    MDM  The xrays show what appear to be mutilobar pneumonia.  She is not hypoxic and there is no fever or elevated wbc.  She is in no respiratory distress and  appears to be stable for discharge to home.  Will treat with zithromax, prn follow up.  Ekg unremarkable and troponin negative.  Doubt cardiac etiology.    I personally performed the services described in this documentation, which was scribed in my presence. The recorded information has been reviewed and is accurate.      Geoffery Lyons, MD 05/21/13 [phone]

## 2013-05-21 NOTE — ED Notes (Signed)
Pt ambulating independently w/ steady gait on d/c in no acute distress, A&Ox4. D/c instructions reviewed w/ pt and family - pt and family deny any further questions or concerns at present. Rx given x2, Pt instructed to not use alcohol, drive, or operate heavy machinery while take the prescription pain medications as they could make him drowsy - pt verbalized understanding.

## 2013-06-14 ENCOUNTER — Encounter (HOSPITAL_BASED_OUTPATIENT_CLINIC_OR_DEPARTMENT_OTHER): Payer: Self-pay | Admitting: *Deleted

## 2013-06-14 ENCOUNTER — Emergency Department (HOSPITAL_BASED_OUTPATIENT_CLINIC_OR_DEPARTMENT_OTHER)
Admission: EM | Admit: 2013-06-14 | Discharge: 2013-06-14 | Disposition: A | Discharge status: Home / Self Care | Payer: Self-pay | Attending: Emergency Medicine | Admitting: Emergency Medicine

## 2013-06-14 ENCOUNTER — Emergency Department (HOSPITAL_BASED_OUTPATIENT_CLINIC_OR_DEPARTMENT_OTHER): Payer: Self-pay

## 2013-06-14 DIAGNOSIS — J209 Acute bronchitis, unspecified: Secondary | ICD-10-CM | POA: Insufficient documentation

## 2013-06-14 DIAGNOSIS — G479 Sleep disorder, unspecified: Secondary | ICD-10-CM | POA: Insufficient documentation

## 2013-06-14 DIAGNOSIS — R638 Other symptoms and signs concerning food and fluid intake: Secondary | ICD-10-CM | POA: Insufficient documentation

## 2013-06-14 DIAGNOSIS — R110 Nausea: Secondary | ICD-10-CM | POA: Insufficient documentation

## 2013-06-14 DIAGNOSIS — Z79899 Other long term (current) drug therapy: Secondary | ICD-10-CM | POA: Insufficient documentation

## 2013-06-14 DIAGNOSIS — R61 Generalized hyperhidrosis: Secondary | ICD-10-CM | POA: Insufficient documentation

## 2013-06-14 DIAGNOSIS — J3489 Other specified disorders of nose and nasal sinuses: Secondary | ICD-10-CM | POA: Insufficient documentation

## 2013-06-14 DIAGNOSIS — I10 Essential (primary) hypertension: Secondary | ICD-10-CM | POA: Insufficient documentation

## 2013-06-14 LAB — BASIC METABOLIC PANEL
BUN: 12 mg/dL (ref 6–23)
Calcium: 10.5 mg/dL (ref 8.4–10.5)
Chloride: 103 mEq/L (ref 96–112)
Creatinine, Ser: 0.7 mg/dL (ref 0.50–1.10)
GFR calc Af Amer: >90 mL/min (ref >90)
GFR calc non Af Amer: >90 mL/min (ref >90)

## 2013-06-14 LAB — CBC WITH DIFFERENTIAL/PLATELET
Basophils Absolute: 0 10*3/uL (ref 0.0–0.1)
Eosinophils Absolute: 0 10*3/uL (ref 0.0–0.7)
Lymphs Abs: 1.8 10*3/uL (ref 0.7–4.0)
MCHC: 34.7 g/dL (ref 30.0–36.0)
MCV: 86.7 fL (ref 78.0–100.0)
Monocytes Relative: 10 % (ref 3–12)
RDW: 13.2 % (ref 11.5–15.5)
WBC: 5.1 10*3/uL (ref 4.0–10.5)

## 2013-06-14 MED ORDER — HYDROCOD POLST-CHLORPHEN POLST 10-8 MG/5ML PO LQCR: 5.0000 mL | Freq: Two times a day (BID) | ORAL | Status: DC | PRN

## 2013-06-14 MED ORDER — SODIUM CHLORIDE 0.9 % IV BOLUS (SEPSIS)
500.0000 mL | Freq: Once | INTRAVENOUS | Status: AC
  Administered 2013-06-14: 500 mL via INTRAVENOUS

## 2013-06-14 MED ORDER — ONDANSETRON HCL 4 MG/2ML IJ SOLN
4.0000 mg | Freq: Once | INTRAMUSCULAR | Status: AC
  Administered 2013-06-14: 4 mg via INTRAVENOUS
  Filled 2013-06-14: qty 2

## 2013-06-14 MED ORDER — ONDANSETRON HCL 4 MG PO TABS: 4.0000 mg | ORAL_TABLET | Freq: Three times a day (TID) | ORAL | Status: DC | PRN

## 2013-06-14 MED ORDER — MOXIFLOXACIN HCL 400 MG PO TABS: 400.0000 mg | ORAL_TABLET | Freq: Every day | ORAL | Status: DC

## 2013-06-14 NOTE — ED Notes (Signed)
Seen here 7/6 treated states still doesn't feel like got completely well and is having more cough yellow sputum nausea and coughing a lot at night

## 2013-06-14 NOTE — ED Provider Notes (Signed)
I saw and evaluated the patient, reviewed the resident's note and I agree with the findings and plan.   .Face to face Exam:  General:  Awake HEENT:  Atraumatic Resp:  Normal effort Abd:  Nondistended Neuro:No focal weakness  Nelia Shi, MD 06/14/13 1127

## 2013-06-14 NOTE — ED Provider Notes (Signed)
CSN: [id number]     Arrival date & time 06/14/13  0930 History     First MD Initiated Contact with Patient 06/14/13 0940     Chief Complaint  Patient presents with  . Cough   (Consider location/radiation/quality/duration/timing/severity/associated sxs/prior Treatment) HPI Comments: Pt is a 42yo female who presents with persistent cough productive of greenish sputum for about 2 and a half weeks, worse for 4 days, along with new nausea and sharp chest wall pain. Pt also endorses sinus and chest congestion but no fever/chills or frank shortness of breath. Pt was seen here at [facility] for similar complaints on 7/6 and treated with 5 days of azithromycin and was given a cough syrup. Pt states she felt better for a few days after that but slowly has gotten worse, especially over the last week. Pain is central/parasternal, sharp, and worse with cough and deep breathing. Cough is worse at night and has been interfering with her sleep. Pt has had poor appetite secondary to her nausea. Otherwise has few complaints.  Pt currently is seen at [facility] clinic (free volunteer clinic) in [place]. She takes medication for hypertension and has completed abx and finished cough syrup, as above. Denies recent travel in or out of the county. Denies sick contacts or known exposure to tuberculosis. Pt and husband state that they think pt had a TB skin test when she was pregnant about 6 years ago, and maybe another about 3 years ago when changing clinics.  The history is provided by the patient and the spouse. No language interpreter was used.    Past Medical History  Diagnosis Date  . Hypertension    Past Surgical History  Procedure Laterality Date  . Cholecystectomy     History reviewed. No pertinent family history. History  Substance Use Topics  . Smoking status: Never Smoker   . Smokeless tobacco: Not on file  . Alcohol Use: No   OB History   Grav Para Term Preterm Abortions TAB SAB Ect Mult  Living                 Review of Systems  Constitutional: Positive for diaphoresis and appetite change. Negative for fever, chills, activity change and fatigue.  HENT: Positive for congestion. Negative for ear pain, nosebleeds, sore throat, rhinorrhea, sneezing, neck pain, neck stiffness and sinus pressure.   Eyes: Negative for pain, redness and itching.  Respiratory: Positive for cough. Negative for chest tightness, shortness of breath and wheezing.   Cardiovascular: Positive for chest pain. Negative for palpitations and leg swelling.  Gastrointestinal: Positive for nausea. Negative for vomiting, abdominal pain, diarrhea and constipation.  Endocrine: Negative for polydipsia and polyuria.  Genitourinary: Negative for dysuria, urgency, frequency and hematuria.  Skin: Negative for rash and wound.  Allergic/Immunologic: Negative for environmental allergies.  Neurological: Negative for dizziness, seizures, weakness, numbness and headaches.  Psychiatric/Behavioral: Positive for sleep disturbance. Negative for confusion and agitation. The patient is not nervous/anxious.     Allergies  Review of patient's allergies indicates no known allergies.  Home Medications   Current Outpatient Rx  Name  Route  Sig  Dispense  Refill  . chlorpheniramine-HYDROcodone (TUSSIONEX PENNKINETIC ER) 10-8 MG/5ML LQCR   Oral   Take 5 mLs by mouth every 12 (twelve) hours as needed.   140 mL   1   . lisinopril (PRINIVIL,ZESTRIL) 10 MG tablet   Oral   Take 10 mg by mouth daily.         Marland Kitchen moxifloxacin (AVELOX)  400 MG tablet   Oral   Take 1 tablet (400 mg total) by mouth daily.   7 tablet   0   . ondansetron (ZOFRAN) 4 MG tablet   Oral   Take 1 tablet (4 mg total) by mouth every 8 (eight) hours as needed for nausea.   20 tablet   0    BP 146/88  Pulse 100  Temp(Src) 98.5 F (36.9 C) (Oral)  Resp 18  SpO2 98%  LMP 04/05/2013 Physical Exam  Nursing note and vitals reviewed. Constitutional: She  is oriented to person, place, and time. She appears well-developed and well-nourished. No distress.  HENT:  Head: Normocephalic and atraumatic.  Right Ear: External ear normal.  Left Ear: External ear normal.  Mouth/Throat: Oropharynx is clear and moist. No oropharyngeal exudate.  Eyes: Conjunctivae and EOM are normal. Pupils are equal, round, and reactive to light. No scleral icterus.  Neck: Normal range of motion. Neck supple.  Cardiovascular: Normal rate, regular rhythm, normal heart sounds and intact distal pulses.   No murmur heard. Pulmonary/Chest: Effort normal. Not tachypneic. No respiratory distress. She has no wheezes. She has no rales. Chest wall is not dull to percussion. She exhibits tenderness. She exhibits no laceration.  Audible congestion anteriorly to auscultation and slightly coarse breath sounds bilaterally posteriorly, left mid-lung fields slightly worse than right. No respiratory distress and normal WOB, speaking in full sentences, not hypoxic. Sharp parasternal pain with deep breathing and cough, exacerbated by exam. Mild parasternal tenderness to palpation.  Abdominal: Soft. Bowel sounds are normal. She exhibits no distension. There is no tenderness. There is no rebound.  Musculoskeletal: Normal range of motion. She exhibits no edema and no tenderness.  Neurological: She is alert and oriented to person, place, and time. She exhibits normal muscle tone.  Skin: Skin is warm. No rash noted. She is diaphoretic. No erythema.  Psychiatric: She has a normal mood and affect. Her behavior is normal.    ED Course   Procedures (including critical care time)  1020: Hx and exam as above. DDx includes bronchitis/CAP incompletely treated or recurrent, post-tussive chest pain/nausea. Very unlikely cardiac pain. Will recheck CBC, BMP, and CXR. Will bolus NS 500 mL and give Zofran IV. Will reassess.  1100: CXR reviewed by me, shows improved aeration of bilateral lung fields, with mild  residual bronchitic changes. No increased WBC, no anemia.   Labs Reviewed  BASIC METABOLIC PANEL - Abnormal; Notable for the following:    Potassium 3.2 (*)    Glucose, Bld 113 (*)    All other components within normal limits  CBC WITH DIFFERENTIAL   Dg Chest 2 View  06/14/2013   *RADIOLOGY REPORT*  Clinical Data: Productive cough, recent pneumonia  CHEST - 2 VIEW  Comparison: 05/21/2013  Findings: Cardiomediastinal silhouette is stable. edema. Improvement in aeration bilateral lung bases without residual infiltrate.  Mild bilateral infrahilar increased bronchial markings suspicious for residual mild bronchitic changes.  No new infiltrate or pulmonary edema.  IMPRESSION:  Improvement in aeration bilateral lung bases without residual infiltrate.  Mild bilateral infrahilar increased bronchial markings suspicious for residual mild bronchitic changes.  No new infiltrate or pulmonary edema.   Original Report Authenticated By: Natasha Mead, M.D.   1. Subacute bronchitis     MDM  41yo female with productive cough, chest wall pain, and post-tussive nausea. No obvious pneumonia, but will treat with repeat abx for possible persistent bronchitis with Avelox x 7 days. Labs and CXR as above, notable only  for mild hypokalemia. Advised PCP f/u. Could consider TB skin testing as an outpt. Return precautions discussed.  The above was discussed in its entirety with attending ED physician Dr. Radford Pax.   Bobbye Morton, MD  PGY-2, [facility] 06/14/2013, 11:17 AM  Bobbye Morton, MD 06/14/13 [phone]

## 2014-11-29 ENCOUNTER — Other Ambulatory Visit (HOSPITAL_BASED_OUTPATIENT_CLINIC_OR_DEPARTMENT_OTHER): Payer: Self-pay | Admitting: Internal Medicine

## 2014-11-29 DIAGNOSIS — Z1231 Encounter for screening mammogram for malignant neoplasm of breast: Secondary | ICD-10-CM

## 2014-12-10 ENCOUNTER — Ambulatory Visit (HOSPITAL_BASED_OUTPATIENT_CLINIC_OR_DEPARTMENT_OTHER): Payer: Self-pay

## 2015-11-22 MED FILL — LOSARTAN-HCTZ 100-12.5 MG T: 100-12.5 | 30 days supply | Qty: 30 | Fill #1

## 2015-12-24 MED FILL — LOSARTAN-HCTZ 100-12.5 MG T: 100-12.5 | 30 days supply | Qty: 30 | Fill #0

## 2016-01-24 MED FILL — LOSARTAN-HCTZ 100-12.5 MG T: 100-12.5 | 30 days supply | Qty: 30 | Fill #1

## 2016-02-28 MED FILL — LOSARTAN-HCTZ 100-12.5 MG T: 100-12.5 | 30 days supply | Qty: 30 | Fill #2

## 2016-04-06 MED FILL — LOSARTAN-HCTZ 100-12.5 MG T: 100-12.5 | 30 days supply | Qty: 30 | Fill #3

## 2016-05-13 MED FILL — LOSARTAN-HCTZ 100-12.5 MG T: 100-12.5 | 30 days supply | Qty: 30 | Fill #4

## 2016-06-09 MED FILL — LOSARTAN-HCTZ 100-12.5 MG T: 100-12.5 | 30 days supply | Qty: 30 | Fill #0

## 2016-07-16 MED FILL — LOSARTAN-HCTZ 100-12.5 MG T: 100-12.5 | 30 days supply | Qty: 30 | Fill #1

## 2016-08-19 MED FILL — LOSARTAN-HCTZ 100-12.5 MG T: 100-12.5 | 30 days supply | Qty: 30 | Fill #2

## 2016-09-28 MED FILL — LOSARTAN-HCTZ 100-12.5 MG T: 100-12.5 | 30 days supply | Qty: 30 | Fill #3

## 2016-11-06 MED FILL — LOSARTAN-HCTZ 100-12.5 MG T: 100-12.5 | 30 days supply | Qty: 30 | Fill #4

## 2016-12-08 MED FILL — LOSARTAN-HCTZ 100-12.5 MG T: 100-12.5 | 30 days supply | Qty: 30 | Fill #0

## 2017-01-14 MED FILL — LOSARTAN-HCTZ 100-12.5 MG T: 100-12.5 | 30 days supply | Qty: 30 | Fill #0

## 2017-02-24 MED FILL — LOSARTAN-HCTZ 100-12.5 MG T: 100-12.5 | 30 days supply | Qty: 30 | Fill #1

## 2017-03-03 MED FILL — BETAMETHASONE DP 0.05% LOT: 0.05 | 30 days supply | Qty: 60 | Fill #0

## 2017-03-03 MED FILL — CLOBETASOL 0.05% SHAMPOO: 0.05 | 30 days supply | Qty: 118 | Fill #0

## 2017-05-10 MED FILL — LOSARTAN-HCTZ 100-12.5 MG T: 100-12.5 | 30 days supply | Qty: 30 | Fill #2

## 2017-06-18 MED FILL — LOSARTAN-HCTZ 100-12.5 MG T: 100-12.5 | 30 days supply | Qty: 30 | Fill #3

## 2017-07-23 MED FILL — LOSARTAN-HCTZ 100-12.5 MG T: 100-12.5 | 30 days supply | Qty: 30 | Fill #4

## 2017-08-24 MED FILL — LOSARTAN-HCTZ 100-12.5 MG T: 100-12.5 | 30 days supply | Qty: 30 | Fill #5

## 2017-09-24 ENCOUNTER — Telehealth: Payer: Self-pay

## 2017-09-24 ENCOUNTER — Ambulatory Visit (INDEPENDENT_AMBULATORY_CARE_PROVIDER_SITE_OTHER): Payer: Self-pay | Admitting: Family Medicine

## 2017-09-24 ENCOUNTER — Encounter: Payer: Self-pay | Admitting: Family Medicine

## 2017-09-24 VITALS — BP 124/83 | HR 83 | Temp 98.6°F | Resp 14 | Ht 64.0 in | Wt 185.0 lb

## 2017-09-24 DIAGNOSIS — Z131 Encounter for screening for diabetes mellitus: Secondary | ICD-10-CM

## 2017-09-24 DIAGNOSIS — I10 Essential (primary) hypertension: Secondary | ICD-10-CM

## 2017-09-24 LAB — BASIC METABOLIC PANEL WITH GFR
BUN: 10 mg/dL (ref 7–25)
CO2: 31 mmol/L (ref 20–32)
CREATININE: 0.78 mg/dL (ref 0.50–1.10)
Calcium: 9.8 mg/dL (ref 8.6–10.2)
Chloride: 102 mmol/L (ref 98–110)
GFR, EST AFRICAN AMERICAN: 105 mL/min/{1.73_m2} (ref >=60)
GFR, EST NON AFRICAN AMERICAN: 91 mL/min/{1.73_m2} (ref >=60)
Glucose, Bld: 86 mg/dL (ref 65–99)
Potassium: 3.5 mmol/L (ref 3.5–5.3)
Sodium: 140 mmol/L (ref 135–146)

## 2017-09-24 LAB — POCT URINALYSIS DIP (DEVICE)
Bilirubin Urine: NEGATIVE
Glucose, UA: NEGATIVE mg/dL
Hgb urine dipstick: NEGATIVE
KETONES UR: NEGATIVE mg/dL
Leukocytes, UA: NEGATIVE
Nitrite: NEGATIVE
PH: 5.5 (ref 5.0–8.0)
PROTEIN: NEGATIVE mg/dL
SPECIFIC GRAVITY, URINE: 1.015 (ref 1.005–1.030)
Urobilinogen, UA: 0.2 mg/dL (ref 0.0–1.0)

## 2017-09-24 LAB — POCT GLYCOSYLATED HEMOGLOBIN (HGB A1C): Hemoglobin A1C: 5.5

## 2017-09-24 MED ORDER — LOSARTAN POTASSIUM-HCTZ 100-12.5 MG PO TABS: 1.0000 | ORAL_TABLET | Freq: Every day | ORAL | 11 refills | Status: DC

## 2017-09-24 MED FILL — LOSARTAN-HCTZ 100-12.5 MG T: 100-12.5 | 90 days supply | Qty: 90 | Fill #0

## 2017-09-24 NOTE — Telephone Encounter (Signed)
Called patient. Advised that she does not have diabetes. Thanks!

## 2017-09-24 NOTE — Patient Instructions (Signed)
Thank you for establishing care.  We will follow-up by phone with any abnormal laboratory results. Your blood pressure is at goal on today no medication changes are warranted.  I sent Hyzaar to your pharmacy. - Continue medication, monitor blood pressure at home. Continue DASH diet. Reminder to go to the ER if any CP, SOB, nausea, dizziness, severe HA, changes vision/speech, left arm numbness and tingling and jaw pain.   Recommend a lowfat, low carbohydrate diet divided over 5-6 small meals, increase water intake to 6-8 glasses, and 150 minutes per week of cardiovascular exercise.

## 2017-09-24 NOTE — Progress Notes (Signed)
Subjective:    Patient ID: Yvette Franklin, female    DOB: 1970-11-18, 46 y.o.   MRN: [id number]  HPI Yvette Franklin, a 46 year old female with a history of hypertension presents accompanied by her husband to establish care.  Patient recently relocated to the area from [place], [place].  She says that she was a patient that [facility].  She has been taking all prescribed antihypertensive medications consistently.  She follows a low-fat, low-sodium diet and exercises routinely.  She states that she recently lost a significant amount of weight by following a very restricted.  Body mass index is 31.76 kg/m.  She currently denies dizziness, chest pains, nausea, vomiting, or diarrhea. She is up-to-date with Pap smear and vaccinations.  She has never had a screening mammogram. Past Medical History:  Diagnosis Date  . Hypertension    Social History   Socioeconomic History  . Marital status: Married    Spouse name: Not on file  . Number of children: Not on file  . Years of education: Not on file  . Highest education level: Not on file  Social Needs  . Financial resource strain: Not on file  . Food insecurity - worry: Not on file  . Food insecurity - inability: Not on file  . Transportation needs - medical: Not on file  . Transportation needs - non-medical: Not on file  Occupational History  . Not on file  Tobacco Use  . Smoking status: Never Smoker  . Smokeless tobacco: Never Used  Substance and Sexual Activity  . Alcohol use: No  . Drug use: No  . Sexual activity: Not on file  Other Topics Concern  . Not on file  Social History Narrative  . Not on file   Immunization History  Administered Date(s) Administered  . Td 01/05/2008   Review of Systems  Constitutional: Negative.   HENT: Negative.   Eyes: Negative.   Respiratory: Negative.   Cardiovascular: Negative.  Negative for chest pain, palpitations and leg swelling.  Gastrointestinal: Negative.    Endocrine: Negative.   Genitourinary: Positive for menstrual problem (perimenopausal). Negative for dysuria, enuresis and flank pain.  Musculoskeletal: Negative.   Skin: Negative.   Allergic/Immunologic: Negative.   Neurological: Negative.   Hematological: Negative.   Psychiatric/Behavioral: Negative.        Objective:   Physical Exam  Constitutional: She appears well-developed and well-nourished.  HENT:  Head: Normocephalic and atraumatic.  Right Ear: External ear normal.  Left Ear: External ear normal.  Nose: Nose normal.  Mouth/Throat: Oropharynx is clear and moist.  Eyes: Pupils are equal, round, and reactive to light.  Neck: Normal range of motion. Neck supple.  Cardiovascular: Normal rate, regular rhythm, normal heart sounds and intact distal pulses.  Pulmonary/Chest: Effort normal and breath sounds normal.  Abdominal: Soft.  Neurological: She is alert. She has normal reflexes.  Skin: Skin is warm and dry.  Psychiatric: She has a normal mood and affect. Her behavior is normal. Judgment and thought content normal.      BP 124/83 (BP Location: Left Arm, Patient Position: Sitting, Cuff Size: Normal)   Pulse 83   Temp 98.6 F (37 C) (Oral)   Resp 14   Ht 5\' 4"  (1.626 m)   Wt 185 lb (83.9 kg)   SpO2 100%   BMI 31.76 kg/m  Assessment & Plan:  1. Essential hypertension Blood pressure is at goal no medication changes warranted.  Reviewed urinalysis, no proteinuria present.  Will review  renal functioning as results become available. Patient will return in 1 month for a fasting lipid panel. - losartan-hydrochlorothiazide (HYZAAR) 100-12.5 MG tablet; Take 1 tablet daily by mouth.  Dispense: 30 tablet; Refill: 11 - Lipid Panel; Future - BASIC METABOLIC PANEL WITH GFR - HgB A1c  2. Diabetes mellitus screening  - HgB A1c    Healthcare maintenance: Recommend yearly eye exam Dental evaluation twice yearly Pap smear every 3 years Annual mammogram Vaccinations as  warranted     RTC: 1 month for lipid panel.  3 months for hypertension    Donia Pounds  MSN, FNP-C [facility] [address] [place], [place] [address] [phone]

## 2017-09-26 ENCOUNTER — Other Ambulatory Visit: Payer: Self-pay | Admitting: Family Medicine

## 2017-09-26 DIAGNOSIS — E876 Hypokalemia: Secondary | ICD-10-CM

## 2017-09-27 MED ORDER — POTASSIUM CHLORIDE ER 20 MEQ PO TBCR: 20.0000 meq | EXTENDED_RELEASE_TABLET | Freq: Every day | ORAL | 0 refills | Status: DC

## 2017-09-27 MED FILL — POTASSIUM CL ER 20 MEQ TAB: 20 | 30 days supply | Qty: 30 | Fill #0

## 2017-09-27 NOTE — Progress Notes (Signed)
Meds ordered this encounter  Medications  . potassium chloride 20 MEQ TBCR    Sig: Take 20 mEq daily by mouth.    Dispense:  30 tablet    Refill:  0    Donia Pounds  MSN, FNP-C [facility] [address] [place], [place] [address] [phone]

## 2017-10-29 ENCOUNTER — Ambulatory Visit: Payer: Self-pay | Admitting: Family Medicine

## 2017-11-05 ENCOUNTER — Ambulatory Visit: Payer: Self-pay | Admitting: Family Medicine

## 2017-12-13 ENCOUNTER — Ambulatory Visit: Payer: Self-pay | Admitting: Family Medicine

## 2018-01-31 MED FILL — LOSARTAN-HCTZ 100-12.5 MG T: 100-12.5 | 30 days supply | Qty: 30 | Fill #1

## 2018-02-16 MED FILL — ACETAMINOPHEN/COD #3 TABLET: 300-30 | 4 days supply | Qty: 16 | Fill #0

## 2018-02-16 MED FILL — PENICILLIN VK 500 MG TABLET: 500 | 7 days supply | Qty: 28 | Fill #0

## 2018-03-08 ENCOUNTER — Other Ambulatory Visit: Payer: Self-pay | Admitting: Family Medicine

## 2018-03-08 DIAGNOSIS — I10 Essential (primary) hypertension: Secondary | ICD-10-CM

## 2018-03-08 MED FILL — LOSARTAN-HCTZ 100-12.5 MG T: 100-12.5 | 30 days supply | Qty: 30 | Fill #0

## 2018-04-20 ENCOUNTER — Other Ambulatory Visit: Payer: Self-pay

## 2018-04-20 ENCOUNTER — Other Ambulatory Visit: Payer: Self-pay | Admitting: Family Medicine

## 2018-04-20 DIAGNOSIS — I10 Essential (primary) hypertension: Secondary | ICD-10-CM

## 2018-04-20 MED ORDER — LOSARTAN POTASSIUM-HCTZ 100-12.5 MG PO TABS: 1.0000 | ORAL_TABLET | Freq: Every day | ORAL | 11 refills | Status: DC

## 2018-04-20 MED FILL — LOSARTAN-HCTZ 100-12.5 MG T: 100-12.5 | 30 days supply | Qty: 30 | Fill #1

## 2018-05-25 MED FILL — LOSARTAN-HCTZ 100-12.5 MG T: 100-12.5 | 30 days supply | Qty: 30 | Fill #2

## 2018-07-01 MED FILL — LOSARTAN-HCTZ 100-12.5 MG T: 100-12.5 | 30 days supply | Qty: 30 | Fill #3

## 2018-08-10 MED FILL — LOSARTAN-HCTZ 100-12.5 MG T: 100-12.5 | 30 days supply | Qty: 30 | Fill #4

## 2018-09-20 MED FILL — LOSARTAN-HCTZ 100-12.5 MG T: 100-12.5 | 30 days supply | Qty: 30 | Fill #5

## 2018-10-21 ENCOUNTER — Ambulatory Visit: Payer: Self-pay | Admitting: Family Medicine

## 2018-11-01 MED FILL — LOSARTAN-HCTZ 100-12.5 MG T: 100-12.5 | 30 days supply | Qty: 30 | Fill #6

## 2018-12-06 MED FILL — LOSARTAN POTASSIUM 100 MG T: 100 | 30 days supply | Qty: 30 | Fill #0

## 2018-12-06 MED FILL — HYDROCHLOROTHIAZIDE 12.5 MG: 12.5 | 30 days supply | Qty: 30 | Fill #0

## 2019-01-20 DIAGNOSIS — R030 Elevated blood-pressure reading, without diagnosis of hypertension: Secondary | ICD-10-CM | POA: Diagnosis not present

## 2019-01-20 DIAGNOSIS — L719 Rosacea, unspecified: Secondary | ICD-10-CM | POA: Diagnosis not present

## 2019-01-20 MED FILL — metroNIDAZOLE 0.75 % CREA: 0.75 | 20 days supply | Qty: 45 | Fill #0

## 2019-02-09 ENCOUNTER — Ambulatory Visit: Payer: Self-pay | Admitting: Family Medicine

## 2019-02-09 ENCOUNTER — Telehealth: Payer: Self-pay | Admitting: Family Medicine

## 2019-02-09 NOTE — Telephone Encounter (Signed)
Please reschedule this patient's appointment.

## 2019-02-16 MED FILL — HYDROCHLOROTHIAZIDE 12.5 MG: 12.5 | 30 days supply | Qty: 30 | Fill #1

## 2019-02-28 DIAGNOSIS — L719 Rosacea, unspecified: Secondary | ICD-10-CM | POA: Diagnosis not present

## 2019-02-28 DIAGNOSIS — L658 Other specified nonscarring hair loss: Secondary | ICD-10-CM | POA: Diagnosis not present

## 2019-02-28 MED FILL — metroNIDAZOLE 0.75 % CREA: 0.75 | 30 days supply | Qty: 45 | Fill #0

## 2019-02-28 MED FILL — FINASTERIDE 5 MG TABLET: 5 | 30 days supply | Qty: 15 | Fill #0

## 2019-03-23 MED FILL — LOSARTAN-HCTZ 100-12.5 MG T: 100-12.5 | 30 days supply | Qty: 30 | Fill #0

## 2019-04-27 ENCOUNTER — Other Ambulatory Visit: Payer: Self-pay | Admitting: Family Medicine

## 2019-04-27 DIAGNOSIS — I10 Essential (primary) hypertension: Secondary | ICD-10-CM

## 2019-04-28 ENCOUNTER — Other Ambulatory Visit: Payer: Self-pay

## 2019-04-28 ENCOUNTER — Ambulatory Visit (INDEPENDENT_AMBULATORY_CARE_PROVIDER_SITE_OTHER): Payer: Self-pay | Admitting: Family Medicine

## 2019-04-28 ENCOUNTER — Encounter: Payer: Self-pay | Admitting: Family Medicine

## 2019-04-28 VITALS — BP 118/75 | HR 87 | Temp 98.5°F | Resp 16 | Ht 64.0 in | Wt 190.0 lb

## 2019-04-28 DIAGNOSIS — I10 Essential (primary) hypertension: Secondary | ICD-10-CM

## 2019-04-28 LAB — POCT URINALYSIS DIPSTICK
Bilirubin, UA: NEGATIVE
Blood, UA: NEGATIVE
Glucose, UA: NEGATIVE
Ketones, UA: NEGATIVE
Leukocytes, UA: NEGATIVE
Nitrite, UA: NEGATIVE
Protein, UA: NEGATIVE
Spec Grav, UA: 1.02 (ref 1.010–1.025)
Urobilinogen, UA: 0.2 E.U./dL
pH, UA: 6.5 (ref 5.0–8.0)

## 2019-04-28 MED ORDER — LOSARTAN POTASSIUM-HCTZ 100-12.5 MG PO TABS: 1.0000 | ORAL_TABLET | Freq: Every day | ORAL | 3 refills | Status: DC

## 2019-04-28 MED FILL — LOSARTAN-HCTZ 100-12.5 MG T: 100-12.5 | 30 days supply | Qty: 30 | Fill #0

## 2019-04-28 NOTE — Progress Notes (Signed)
[facility]  New Patient Encounter Provider: Lanae Boast, [place]    FXT:[id number]  GDJ:[id number]  DOB - 1971/08/01  SUBJECTIVE:   Yvette Franklin, is a 48 y.o. female who presents to re-establish care with this clinic. Patient states that she is doing well. Has been compliant with her medications since her last visit in 09/2017. She states denies side effects from the medications. She is doing well and without complaint today. She declines health maintenance items: pap smear and TDAP.   No Known Allergies Past Medical History:  Diagnosis Date  . Hypertension    No current outpatient medications on file prior to visit.   No current facility-administered medications on file prior to visit.    History reviewed. No pertinent family history. Social History   Socioeconomic History  . Marital status: Married    Spouse name: Not on file  . Number of children: Not on file  . Years of education: Not on file  . Highest education level: Not on file  Occupational History  . Not on file  Social Needs  . Financial resource strain: Not on file  . Food insecurity    Worry: Not on file    Inability: Not on file  . Transportation needs    Medical: Not on file    Non-medical: Not on file  Tobacco Use  . Smoking status: Never Smoker  . Smokeless tobacco: Never Used  Substance and Sexual Activity  . Alcohol use: No  . Drug use: No  . Sexual activity: Not on file  Lifestyle  . Physical activity    Days per week: Not on file    Minutes per session: Not on file  . Stress: Not on file  Relationships  . Social Herbalist on phone: Not on file    Gets together: Not on file    Attends religious service: Not on file    Active member of club or organization: Not on file    Attends meetings of clubs or organizations: Not on file    Relationship status: Not on file  . Intimate partner violence    Fear of current or ex partner: Not  on file    Emotionally abused: Not on file    Physically abused: Not on file    Forced sexual activity: Not on file  Other Topics Concern  . Not on file  Social History Narrative  . Not on file    Review of Systems  Constitutional: Negative.   HENT: Negative.   Eyes: Negative.   Respiratory: Negative.   Cardiovascular: Negative.   Gastrointestinal: Negative.   Genitourinary: Negative.   Musculoskeletal: Negative.   Skin: Negative.   Neurological: Negative.   Psychiatric/Behavioral: Negative.      OBJECTIVE:    BP 118/75 (BP Location: Left Arm, Patient Position: Sitting, Cuff Size: Normal)   Pulse 87   Temp 98.5 F (36.9 C) (Oral)   Resp 16   Ht 5\' 4"  (1.626 m)   Wt 190 lb (86.2 kg)   SpO2 100%   BMI 32.61 kg/m   Physical Exam  Constitutional: She is oriented to person, place, and time and well-developed, well-nourished, and in no distress. No distress.  HENT:  Head: Normocephalic and atraumatic.  Eyes: Pupils are equal, round, and reactive to light. Conjunctivae and EOM are normal.  Neck: Normal range of motion. Neck supple.  Cardiovascular: Normal rate, regular rhythm and intact distal pulses. Exam reveals  no gallop and no friction rub.  No murmur heard. Pulmonary/Chest: Effort normal and breath sounds normal. No respiratory distress. She has no wheezes.  Abdominal: Soft. Bowel sounds are normal. There is no abdominal tenderness.  Musculoskeletal: Normal range of motion.        General: No tenderness or edema.  Lymphadenopathy:    She has no cervical adenopathy.  Neurological: She is alert and oriented to person, place, and time. Gait normal.  Skin: Skin is warm and dry.  Psychiatric: Mood, memory, affect and judgment normal.  Nursing note and vitals reviewed.    ASSESSMENT/PLAN:  1. Essential hypertension No medication changes warranted at the present time.  Discussed DASH diet and exercise.  - Urinalysis Dipstick - losartan-hydrochlorothiazide  (HYZAAR) 100-12.5 MG tablet; Take 1 tablet by mouth daily.  Dispense: 90 tablet; Refill: 3   Return in about 6 months (around 10/28/2019).  The patient was given clear instructions to go to ER or return to medical center if symptoms don't improve, worsen or new problems develop. The patient verbalized understanding. The patient was told to call to get lab results if they haven't heard anything in the next week.     This note has been created with Surveyor, quantity. Any transcriptional errors are unintentional.   Ms. Andr L. Nathaneil Canary, FNP-BC [facility] [address] [place], [place] [address] [phone]

## 2019-04-28 NOTE — Patient Instructions (Signed)
It was a pleasure meeting you. I sent your refills to the pharmacy. Please remember to keep your follow up appointment.   DASH Eating Plan DASH stands for "Dietary Approaches to Stop Hypertension." The DASH eating plan is a healthy eating plan that has been shown to reduce high blood pressure (hypertension). It may also reduce your risk for type 2 diabetes, heart disease, and stroke. The DASH eating plan may also help with weight loss. What are tips for following this plan?  General guidelines  Avoid eating more than 2,300 mg (milligrams) of salt (sodium) a day. If you have hypertension, you may need to reduce your sodium intake to 1,500 mg a day.  Limit alcohol intake to no more than 1 drink a day for nonpregnant women and 2 drinks a day for men. One drink equals 12 oz of beer, 5 oz of wine, or 1 oz of hard liquor.  Work with your health care provider to maintain a healthy body weight or to lose weight. Ask what an ideal weight is for you.  Get at least 30 minutes of exercise that causes your heart to beat faster (aerobic exercise) most days of the week. Activities may include walking, swimming, or biking.  Work with your health care provider or diet and nutrition specialist (dietitian) to adjust your eating plan to your individual calorie needs. Reading food labels   Check food labels for the amount of sodium per serving. Choose foods with less than 5 percent of the Daily Value of sodium. Generally, foods with less than 300 mg of sodium per serving fit into this eating plan.  To find whole grains, look for the word "whole" as the first word in the ingredient list. Shopping  Buy products labeled as "low-sodium" or "no salt added."  Buy fresh foods. Avoid canned foods and premade or frozen meals. Cooking  Avoid adding salt when cooking. Use salt-free seasonings or herbs instead of table salt or sea salt. Check with your health care provider or pharmacist before using salt substitutes.   Do not fry foods. Cook foods using healthy methods such as baking, boiling, grilling, and broiling instead.  Cook with heart-healthy oils, such as olive, canola, soybean, or sunflower oil. Meal planning  Eat a balanced diet that includes: ? 5 or more servings of fruits and vegetables each day. At each meal, try to fill half of your plate with fruits and vegetables. ? Up to 6-8 servings of whole grains each day. ? Less than 6 oz of lean meat, poultry, or fish each day. A 3-oz serving of meat is about the same size as a deck of cards. One egg equals 1 oz. ? 2 servings of low-fat dairy each day. ? A serving of nuts, seeds, or beans 5 times each week. ? Heart-healthy fats. Healthy fats called Omega-3 fatty acids are found in foods such as flaxseeds and coldwater fish, like sardines, salmon, and mackerel.  Limit how much you eat of the following: ? Canned or prepackaged foods. ? Food that is high in trans fat, such as fried foods. ? Food that is high in saturated fat, such as fatty meat. ? Sweets, desserts, sugary drinks, and other foods with added sugar. ? Full-fat dairy products.  Do not salt foods before eating.  Try to eat at least 2 vegetarian meals each week.  Eat more home-cooked food and less restaurant, buffet, and fast food.  When eating at a restaurant, ask that your food be prepared with less salt  or no salt, if possible. What foods are recommended? The items listed may not be a complete list. Talk with your dietitian about what dietary choices are best for you. Grains Whole-grain or whole-wheat bread. Whole-grain or whole-wheat pasta. Brown rice. [place]. Bulgur. Whole-grain and low-sodium cereals. Pita bread. Low-fat, low-sodium crackers. Whole-wheat flour tortillas. Vegetables Fresh or frozen vegetables (raw, steamed, roasted, or grilled). Low-sodium or reduced-sodium tomato and vegetable juice. Low-sodium or reduced-sodium tomato sauce and tomato paste.  Low-sodium or reduced-sodium canned vegetables. Fruits All fresh, dried, or frozen fruit. Canned fruit in natural juice (without added sugar). Meat and other protein foods Skinless chicken or [place]. Ground chicken or [place]. Pork with fat trimmed off. Fish and seafood. Egg whites. Dried beans, peas, or lentils. Unsalted nuts, nut butters, and seeds. Unsalted canned beans. Lean cuts of beef with fat trimmed off. Low-sodium, lean deli meat. Dairy Low-fat (1%) or fat-free (skim) milk. Fat-free, low-fat, or reduced-fat cheeses. Nonfat, low-sodium ricotta or cottage cheese. Low-fat or nonfat yogurt. Low-fat, low-sodium cheese. Fats and oils Soft margarine without trans fats. Vegetable oil. Low-fat, reduced-fat, or light mayonnaise and salad dressings (reduced-sodium). Canola, safflower, olive, soybean, and sunflower oils. Avocado. Seasoning and other foods Herbs. Spices. Seasoning mixes without salt. Unsalted popcorn and pretzels. Fat-free sweets. What foods are not recommended? The items listed may not be a complete list. Talk with your dietitian about what dietary choices are best for you. Grains Baked goods made with fat, such as croissants, muffins, or some breads. Dry pasta or rice meal packs. Vegetables Creamed or fried vegetables. Vegetables in a cheese sauce. Regular canned vegetables (not low-sodium or reduced-sodium). Regular canned tomato sauce and paste (not low-sodium or reduced-sodium). Regular tomato and vegetable juice (not low-sodium or reduced-sodium). Angie Fava. Olives. Fruits Canned fruit in a light or heavy syrup. Fried fruit. Fruit in cream or butter sauce. Meat and other protein foods Fatty cuts of meat. Ribs. Fried meat. Berniece Salines. Sausage. Bologna and other processed lunch meats. Salami. Fatback. Hotdogs. Bratwurst. Salted nuts and seeds. Canned beans with added salt. Canned or smoked fish. Whole eggs or egg yolks. Chicken or [place] with skin. Dairy Whole or 2% milk, cream, and  half-and-half. Whole or full-fat cream cheese. Whole-fat or sweetened yogurt. Full-fat cheese. Nondairy creamers. Whipped toppings. Processed cheese and cheese spreads. Fats and oils Butter. Stick margarine. Lard. Shortening. Ghee. Bacon fat. Tropical oils, such as coconut, palm kernel, or palm oil. Seasoning and other foods Salted popcorn and pretzels. Onion salt, garlic salt, seasoned salt, table salt, and sea salt. Worcestershire sauce. Tartar sauce. Barbecue sauce. Teriyaki sauce. Soy sauce, including reduced-sodium. Steak sauce. Canned and packaged gravies. Fish sauce. Oyster sauce. Cocktail sauce. Horseradish that you find on the shelf. Ketchup. Mustard. Meat flavorings and tenderizers. Bouillon cubes. Hot sauce and Tabasco sauce. Premade or packaged marinades. Premade or packaged taco seasonings. Relishes. Regular salad dressings. Where to find more information:  National Heart, Lung, and [place]: https://wilson-eaton.com/  American Heart Association: www.heart.org Summary  The DASH eating plan is a healthy eating plan that has been shown to reduce high blood pressure (hypertension). It may also reduce your risk for type 2 diabetes, heart disease, and stroke.  With the DASH eating plan, you should limit salt (sodium) intake to 2,300 mg a day. If you have hypertension, you may need to reduce your sodium intake to 1,500 mg a day.  When on the DASH eating plan, aim to eat more fresh fruits and vegetables, whole grains, lean proteins, low-fat dairy,  and heart-healthy fats.  Work with your health care provider or diet and nutrition specialist (dietitian) to adjust your eating plan to your individual calorie needs. This information is not intended to replace advice given to you by your health care provider. Make sure you discuss any questions you have with your health care provider. Document Released: 10/22/2011 Document Revised: 10/26/2016 Document Reviewed: 10/26/2016 Elsevier Interactive  Patient Education  2019 [employer]. Hypertension Hypertension is another name for high blood pressure. High blood pressure forces your heart to work harder to pump blood. This can cause problems over time. There are two numbers in a blood pressure reading. There is a top number (systolic) over a bottom number (diastolic). It is best to have a blood pressure below 120/80. Healthy choices can help lower your blood pressure. You may need medicine to help lower your blood pressure if:  Your blood pressure cannot be lowered with healthy choices.  Your blood pressure is higher than 130/80. Follow these instructions at home: Eating and drinking   If directed, follow the DASH eating plan. This diet includes: ? Filling half of your plate at each meal with fruits and vegetables. ? Filling one quarter of your plate at each meal with whole grains. Whole grains include whole wheat pasta, brown rice, and whole grain bread. ? Eating or drinking low-fat dairy products, such as skim milk or low-fat yogurt. ? Filling one quarter of your plate at each meal with low-fat (lean) proteins. Low-fat proteins include fish, skinless chicken, eggs, beans, and tofu. ? Avoiding fatty meat, cured and processed meat, or chicken with skin. ? Avoiding premade or processed food.  Eat less than 1,500 mg of salt (sodium) a day.  Limit alcohol use to no more than 1 drink a day for nonpregnant women and 2 drinks a day for men. One drink equals 12 oz of beer, 5 oz of wine, or 1 oz of hard liquor. Lifestyle  Work with your doctor to stay at a healthy weight or to lose weight. Ask your doctor what the best weight is for you.  Get at least 30 minutes of exercise that causes your heart to beat faster (aerobic exercise) most days of the week. This may include walking, swimming, or biking.  Get at least 30 minutes of exercise that strengthens your muscles (resistance exercise) at least 3 days a week. This may include lifting  weights or pilates.  Do not use any products that contain nicotine or tobacco. This includes cigarettes and e-cigarettes. If you need help quitting, ask your doctor.  Check your blood pressure at home as told by your doctor.  Keep all follow-up visits as told by your doctor. This is important. Medicines  Take over-the-counter and prescription medicines only as told by your doctor. Follow directions carefully.  Do not skip doses of blood pressure medicine. The medicine does not work as well if you skip doses. Skipping doses also puts you at risk for problems.  Ask your doctor about side effects or reactions to medicines that you should watch for. Contact a doctor if:  You think you are having a reaction to the medicine you are taking.  You have headaches that keep coming back (recurring).  You feel dizzy.  You have swelling in your ankles.  You have trouble with your vision. Get help right away if:  You get a very bad headache.  You start to feel confused.  You feel weak or numb.  You feel faint.  You get  very bad pain in your: ? Chest. ? Belly (abdomen).  You throw up (vomit) more than once.  You have trouble breathing. Summary  Hypertension is another name for high blood pressure.  Making healthy choices can help lower blood pressure. If your blood pressure cannot be controlled with healthy choices, you may need to take medicine. This information is not intended to replace advice given to you by your health care provider. Make sure you discuss any questions you have with your health care provider. Document Released: 04/20/2008 Document Revised: 09/30/2016 Document Reviewed: 09/30/2016 Elsevier Interactive Patient Education  2019 [place] Maintenance, Female Adopting a healthy lifestyle and getting preventive care can go a long way to promote health and wellness. Talk with your health care provider about what schedule of regular examinations is right  for you. This is a good chance for you to check in with your provider about disease prevention and staying healthy. In between checkups, there are plenty of things you can do on your own. Experts have done a lot of research about which lifestyle changes and preventive measures are most likely to keep you healthy. Ask your health care provider for more information. Weight and diet Eat a healthy diet  Be sure to include plenty of vegetables, fruits, low-fat dairy products, and lean protein.  Do not eat a lot of foods high in solid fats, added sugars, or salt.  Get regular exercise. This is one of the most important things you can do for your health. ? Most adults should exercise for at least 150 minutes each week. The exercise should increase your heart rate and make you sweat (moderate-intensity exercise). ? Most adults should also do strengthening exercises at least twice a week. This is in addition to the moderate-intensity exercise. Maintain a healthy weight  Body mass index (BMI) is a measurement that can be used to identify possible weight problems. It estimates body fat based on height and weight. Your health care provider can help determine your BMI and help you achieve or maintain a healthy weight.  For females 85 years of age and older: ? A BMI below 18.5 is considered underweight. ? A BMI of 18.5 to 24.9 is normal. ? A BMI of 25 to 29.9 is considered overweight. ? A BMI of 30 and above is considered obese. Watch levels of cholesterol and blood lipids  You should start having your blood tested for lipids and cholesterol at 49 years of age, then have this test every 5 years.  You may need to have your cholesterol levels checked more often if: ? Your lipid or cholesterol levels are high. ? You are older than 50 years of age. ? You are at high risk for heart disease. Cancer screening Lung Cancer  Lung cancer screening is recommended for adults 84-65 years old who are at high risk  for lung cancer because of a history of smoking.  A yearly low-dose CT scan of the lungs is recommended for people who: ? Currently smoke. ? Have quit within the past 15 years. ? Have at least a 30-pack-year history of smoking. A pack year is smoking an average of one pack of cigarettes a day for 1 year.  Yearly screening should continue until it has been 15 years since you quit.  Yearly screening should stop if you develop a health problem that would prevent you from having lung cancer treatment. Breast Cancer  Practice breast self-awareness. This means understanding how your breasts normally appear and  feel.  It also means doing regular breast self-exams. Let your health care provider know about any changes, no matter how small.  If you are in your 20s or 30s, you should have a clinical breast exam (CBE) by a health care provider every 1-3 years as part of a regular health exam.  If you are 51 or older, have a CBE every year. Also consider having a breast X-ray (mammogram) every year.  If you have a family history of breast cancer, talk to your health care provider about genetic screening.  If you are at high risk for breast cancer, talk to your health care provider about having an MRI and a mammogram every year.  Breast cancer gene (BRCA) assessment is recommended for women who have family members with BRCA-related cancers. BRCA-related cancers include: ? Breast. ? Ovarian. ? Tubal. ? Peritoneal cancers.  Results of the assessment will determine the need for genetic counseling and BRCA1 and BRCA2 testing. Cervical Cancer Your health care provider may recommend that you be screened regularly for cancer of the pelvic organs (ovaries, uterus, and vagina). This screening involves a pelvic examination, including checking for microscopic changes to the surface of your cervix (Pap test). You may be encouraged to have this screening done every 3 years, beginning at age 31.  For women  ages 45-65, health care providers may recommend pelvic exams and Pap testing every 3 years, or they may recommend the Pap and pelvic exam, combined with testing for human papilloma virus (HPV), every 5 years. Some types of HPV increase your risk of cervical cancer. Testing for HPV may also be done on women of any age with unclear Pap test results.  Other health care providers may not recommend any screening for nonpregnant women who are considered low risk for pelvic cancer and who do not have symptoms. Ask your health care provider if a screening pelvic exam is right for you.  If you have had past treatment for cervical cancer or a condition that could lead to cancer, you need Pap tests and screening for cancer for at least 20 years after your treatment. If Pap tests have been discontinued, your risk factors (such as having a new sexual partner) need to be reassessed to determine if screening should resume. Some women have medical problems that increase the chance of getting cervical cancer. In these cases, your health care provider may recommend more frequent screening and Pap tests. Colorectal Cancer  This type of cancer can be detected and often prevented.  Routine colorectal cancer screening usually begins at 49 years of age and continues through 49 years of age.  Your health care provider may recommend screening at an earlier age if you have risk factors for colon cancer.  Your health care provider may also recommend using home test kits to check for hidden blood in the stool.  A small camera at the end of a tube can be used to examine your colon directly (sigmoidoscopy or colonoscopy). This is done to check for the earliest forms of colorectal cancer.  Routine screening usually begins at age 75.  Direct examination of the colon should be repeated every 5-10 years through 49 years of age. However, you may need to be screened more often if early forms of precancerous polyps or small growths  are found. Skin Cancer  Check your skin from head to toe regularly.  Tell your health care provider about any new moles or changes in moles, especially if there is a change  in a mole's shape or color.  Also tell your health care provider if you have a mole that is larger than the size of a pencil eraser.  Always use sunscreen. Apply sunscreen liberally and repeatedly throughout the day.  Protect yourself by wearing long sleeves, pants, a wide-brimmed hat, and sunglasses whenever you are outside. Heart disease, diabetes, and high blood pressure  High blood pressure causes heart disease and increases the risk of stroke. High blood pressure is more likely to develop in: ? People who have blood pressure in the high end of the normal range (130-139/85-89 mm Hg). ? People who are overweight or obese. ? People who are African American.  If you are 66-57 years of age, have your blood pressure checked every 3-5 years. If you are 27 years of age or older, have your blood pressure checked every year. You should have your blood pressure measured twice-once when you are at a hospital or clinic, and once when you are not at a hospital or clinic. Record the average of the two measurements. To check your blood pressure when you are not at a hospital or clinic, you can use: ? An automated blood pressure machine at a pharmacy. ? A home blood pressure monitor.  If you are between 75 years and 3 years old, ask your health care provider if you should take aspirin to prevent strokes.  Have regular diabetes screenings. This involves taking a blood sample to check your fasting blood sugar level. ? If you are at a normal weight and have a low risk for diabetes, have this test once every three years after 49 years of age. ? If you are overweight and have a high risk for diabetes, consider being tested at a younger age or more often. Preventing infection Hepatitis B  If you have a higher risk for hepatitis B,  you should be screened for this virus. You are considered at high risk for hepatitis B if: ? You were born in a country where hepatitis B is common. Ask your health care provider which countries are considered high risk. ? Your parents were born in a high-risk country, and you have not been immunized against hepatitis B (hepatitis B vaccine). ? You have HIV or AIDS. ? You use needles to inject street drugs. ? You live with someone who has hepatitis B. ? You have had sex with someone who has hepatitis B. ? You get hemodialysis treatment. ? You take certain medicines for conditions, including cancer, organ transplantation, and autoimmune conditions. Hepatitis C  Blood testing is recommended for: ? Everyone born from [birth year] through 1966. ? Anyone with known risk factors for hepatitis C. Sexually transmitted infections (STIs)  You should be screened for sexually transmitted infections (STIs) including gonorrhea and chlamydia if: ? You are sexually active and are younger than 49 years of age. ? You are older than 49 years of age and your health care provider tells you that you are at risk for this type of infection. ? Your sexual activity has changed since you were last screened and you are at an increased risk for chlamydia or gonorrhea. Ask your health care provider if you are at risk.  If you do not have HIV, but are at risk, it may be recommended that you take a prescription medicine daily to prevent HIV infection. This is called pre-exposure prophylaxis (PrEP). You are considered at risk if: ? You are sexually active and do not regularly use condoms or know the  HIV status of your partner(s). ? You take drugs by injection. ? You are sexually active with a partner who has HIV. Talk with your health care provider about whether you are at high risk of being infected with HIV. If you choose to begin PrEP, you should first be tested for HIV. You should then be tested every 3 months for as long as  you are taking PrEP. Pregnancy  If you are premenopausal and you may become pregnant, ask your health care provider about preconception counseling.  If you may become pregnant, take 400 to 800 micrograms (mcg) of folic acid every day.  If you want to prevent pregnancy, talk to your health care provider about birth control (contraception). Osteoporosis and menopause  Osteoporosis is a disease in which the bones lose minerals and strength with aging. This can result in serious bone fractures. Your risk for osteoporosis can be identified using a bone density scan.  If you are 21 years of age or older, or if you are at risk for osteoporosis and fractures, ask your health care provider if you should be screened.  Ask your health care provider whether you should take a calcium or vitamin D supplement to lower your risk for osteoporosis.  Menopause may have certain physical symptoms and risks.  Hormone replacement therapy may reduce some of these symptoms and risks. Talk to your health care provider about whether hormone replacement therapy is right for you. Follow these instructions at home:  Schedule regular health, dental, and eye exams.  Stay current with your immunizations.  Do not use any tobacco products including cigarettes, chewing tobacco, or electronic cigarettes.  If you are pregnant, do not drink alcohol.  If you are breastfeeding, limit how much and how often you drink alcohol.  Limit alcohol intake to no more than 1 drink per day for nonpregnant women. One drink equals 12 ounces of beer, 5 ounces of wine, or 1 ounces of hard liquor.  Do not use street drugs.  Do not share needles.  Ask your health care provider for help if you need support or information about quitting drugs.  Tell your health care provider if you often feel depressed.  Tell your health care provider if you have ever been abused or do not feel safe at home. This information is not intended to  replace advice given to you by your health care provider. Make sure you discuss any questions you have with your health care provider. Document Released: 05/18/2011 Document Revised: 04/09/2016 Document Reviewed: 08/06/2015 Elsevier Interactive Patient Education  2019 [employer].

## 2019-04-29 LAB — CBC WITH DIFFERENTIAL/PLATELET
Basophils Absolute: 0.1 10*3/uL (ref 0.0–0.2)
Basos: 1 %
EOS (ABSOLUTE): 0 10*3/uL (ref 0.0–0.4)
Eos: 0 %
Hematocrit: 43.3 % (ref 34.0–46.6)
Hemoglobin: 14.1 g/dL (ref 11.1–15.9)
Immature Grans (Abs): 0 10*3/uL (ref 0.0–0.1)
Immature Granulocytes: 0 %
Lymphocytes Absolute: 1.7 10*3/uL (ref 0.7–3.1)
Lymphs: 32 %
MCH: 28.8 pg (ref 26.6–33.0)
MCHC: 32.6 g/dL (ref 31.5–35.7)
MCV: 89 fL (ref 79–97)
Monocytes Absolute: 0.4 10*3/uL (ref 0.1–0.9)
Monocytes: 7 %
Neutrophils Absolute: 3.1 10*3/uL (ref 1.4–7.0)
Neutrophils: 60 %
Platelets: 126 10*3/uL — ABNORMAL LOW (ref 150–450)
RBC: 4.89 x10E6/uL (ref 3.77–5.28)
RDW: 12.9 % (ref 11.7–15.4)
WBC: 5.3 10*3/uL (ref 3.4–10.8)

## 2019-04-29 LAB — COMPREHENSIVE METABOLIC PANEL
ALT: 11 IU/L (ref 0–32)
AST: 19 IU/L (ref 0–40)
Albumin/Globulin Ratio: 1.4 (ref 1.2–2.2)
Albumin: 4.4 g/dL (ref 3.8–4.8)
Alkaline Phosphatase: 81 IU/L (ref 39–117)
BUN/Creatinine Ratio: 13 (ref 9–23)
BUN: 10 mg/dL (ref 6–24)
Bilirubin Total: 0.4 mg/dL (ref 0.0–1.2)
CO2: 25 mmol/L (ref 20–29)
Calcium: 9.7 mg/dL (ref 8.7–10.2)
Chloride: 102 mmol/L (ref 96–106)
Creatinine, Ser: 0.79 mg/dL (ref 0.57–1.00)
GFR calc Af Amer: 102 mL/min/{1.73_m2} (ref >59)
GFR calc non Af Amer: 89 mL/min/{1.73_m2} (ref >59)
Globulin, Total: 3.1 g/dL (ref 1.5–4.5)
Glucose: 91 mg/dL (ref 65–99)
Potassium: 3.9 mmol/L (ref 3.5–5.2)
Sodium: 140 mmol/L (ref 134–144)
Total Protein: 7.5 g/dL (ref 6.0–8.5)

## 2019-04-29 LAB — LIPID PANEL
Chol/HDL Ratio: 4.4 ratio (ref 0.0–4.4)
Cholesterol, Total: 240 mg/dL — ABNORMAL HIGH (ref 100–199)
HDL: 55 mg/dL (ref >39)
LDL Calculated: 165 mg/dL — ABNORMAL HIGH (ref 0–99)
Triglycerides: 102 mg/dL (ref 0–149)
VLDL Cholesterol Cal: 20 mg/dL (ref 5–40)

## 2019-05-11 ENCOUNTER — Telehealth: Payer: Self-pay

## 2019-05-11 MED ORDER — ATORVASTATIN CALCIUM 20 MG PO TABS: 20.0000 mg | ORAL_TABLET | Freq: Every day | ORAL | 11 refills | Status: DC

## 2019-05-11 MED FILL — ATORVASTATIN 20 MG TABLET: 20 | 30 days supply | Qty: 30 | Fill #0

## 2019-05-11 NOTE — Addendum Note (Signed)
Addended by: Genelle Bal on: 05/11/2019 11:54 AM   Modules accepted: Orders

## 2019-05-11 NOTE — Telephone Encounter (Signed)
-----   Message from Lanae Boast, [place] sent at 05/11/2019 11:54 AM EDT ----- Your cholesterol is not at goal. Consuming a heart healthy diet that includes whole foods such as fruits and vegetables, lean meats, and whole grains can help. Walking 30 minutes every day can also help keep your heart healthy. I am going to start you on a low dose of a statin to help with this.(lipitor 20 mg) Please take this medication prior to bed every day. We will repeat your labs in 6 months.

## 2019-05-11 NOTE — Progress Notes (Signed)
Your cholesterol is not at goal. Consuming a heart healthy diet that includes whole foods such as fruits and vegetables, lean meats, and whole grains can help. Walking 30 minutes every day can also help keep your heart healthy. I am going to start you on a low dose of a statin to help with this.(lipitor 20 mg) Please take this medication prior to bed every day. We will repeat your labs in 6 months.

## 2019-05-11 NOTE — Telephone Encounter (Signed)
Called, no answer. Left a message for patient to call back. Thanks!  

## 2019-06-01 MED FILL — LOSARTAN-HCTZ 100-12.5 MG T: 100-12.5 | 30 days supply | Qty: 30 | Fill #1

## 2019-06-29 MED FILL — LOSARTAN-HCTZ 100-12.5 MG T: 100-12.5 | 30 days supply | Qty: 30 | Fill #2

## 2019-06-29 MED FILL — ATORVASTATIN 20 MG TABLET: 20 | 30 days supply | Qty: 30 | Fill #1

## 2019-07-26 ENCOUNTER — Encounter (HOSPITAL_COMMUNITY): Payer: Self-pay

## 2019-07-26 ENCOUNTER — Encounter (HOSPITAL_COMMUNITY): Payer: Self-pay | Admitting: *Deleted

## 2019-08-07 MED FILL — LOSARTAN-HCTZ 100-12.5 MG T: 100-12.5 | 30 days supply | Qty: 30 | Fill #3

## 2019-08-07 MED FILL — ATORVASTATIN 20 MG TABLET: 20 | 30 days supply | Qty: 30 | Fill #2

## 2019-09-13 MED FILL — LOSARTAN-HCTZ 100-12.5 MG T: 100-12.5 | 30 days supply | Qty: 30 | Fill #4

## 2019-09-15 MED FILL — ATORVASTATIN 20 MG TABLET: 20 | 30 days supply | Qty: 30 | Fill #3

## 2019-10-20 MED FILL — LOSARTAN-HCTZ 100-12.5 MG T: 100-12.5 | 30 days supply | Qty: 30 | Fill #5

## 2019-10-20 MED FILL — ATORVASTATIN 20 MG TABLET: 20 | 30 days supply | Qty: 30 | Fill #4

## 2019-10-30 ENCOUNTER — Ambulatory Visit: Payer: Self-pay | Admitting: Family Medicine

## 2019-10-30 ENCOUNTER — Other Ambulatory Visit: Payer: Self-pay

## 2019-10-30 DIAGNOSIS — I10 Essential (primary) hypertension: Secondary | ICD-10-CM

## 2019-10-30 MED ORDER — ATORVASTATIN CALCIUM 20 MG PO TABS: 20.0000 mg | ORAL_TABLET | Freq: Every day | ORAL | 1 refills | Status: DC

## 2019-10-30 MED ORDER — LOSARTAN POTASSIUM-HCTZ 100-12.5 MG PO TABS: 1.0000 | ORAL_TABLET | Freq: Every day | ORAL | 1 refills | Status: DC

## 2019-12-29 ENCOUNTER — Ambulatory Visit (HOSPITAL_COMMUNITY)
Admission: RE | Admit: 2019-12-29 | Discharge: 2019-12-29 | Disposition: A | Discharge status: Home / Self Care | Payer: BLUE CROSS/BLUE SHIELD | Source: Ambulatory Visit | Attending: Nurse Practitioner | Admitting: Nurse Practitioner

## 2019-12-29 ENCOUNTER — Other Ambulatory Visit: Payer: Self-pay

## 2019-12-29 ENCOUNTER — Ambulatory Visit (INDEPENDENT_AMBULATORY_CARE_PROVIDER_SITE_OTHER): Payer: BLUE CROSS/BLUE SHIELD | Admitting: Nurse Practitioner

## 2019-12-29 ENCOUNTER — Encounter: Payer: Self-pay | Admitting: Nurse Practitioner

## 2019-12-29 VITALS — BP 129/76 | HR 93 | Temp 98.7°F | Resp 16 | Ht 64.0 in | Wt 172.0 lb

## 2019-12-29 DIAGNOSIS — I10 Essential (primary) hypertension: Secondary | ICD-10-CM

## 2019-12-29 DIAGNOSIS — K5900 Constipation, unspecified: Secondary | ICD-10-CM | POA: Insufficient documentation

## 2019-12-29 DIAGNOSIS — F341 Dysthymic disorder: Secondary | ICD-10-CM

## 2019-12-29 DIAGNOSIS — R140 Abdominal distension (gaseous): Secondary | ICD-10-CM | POA: Diagnosis not present

## 2019-12-29 DIAGNOSIS — R109 Unspecified abdominal pain: Secondary | ICD-10-CM | POA: Diagnosis not present

## 2019-12-29 NOTE — Progress Notes (Signed)
Acute Office Visit  Subjective:    Patient ID: Yvette Franklin, female    DOB: Mar 05, 1971, 49 y.o.   MRN: [id number]  Chief Complaint  Patient presents with  . Constipation    constipation x 3 weeks     HPI Patient is in today for constipation.  She has a history of hypertension and is currently on losartan/hydrochlorothiazide 100/12.5.  She also has hyperlipidemia currently only thinking of this 20 mg nightly.  She admits that she has been having constipation for 3 weeks.  She has used Citroma in divided doses which was not completely effective.  She has had some nausea no vomiting.  She does notice increased gas.  She is also having some lefted back pain side pain.  She did not complain about a lot of bloating. She denies any dietary changes.  She admits that she do worked for day.  She does drink about 4-6 glasses of water.  She has been monitoring her diet and exercising.  She has lost weight gradually.  She denies any fever, chills, shortness of breath or chest pain.  Past Medical History:  Diagnosis Date  . Hypertension     Past Surgical History:  Procedure Laterality Date  . CHOLECYSTECTOMY      History reviewed. No pertinent family history.  Social History   Socioeconomic History  . Marital status: Married    Spouse name: Not on file  . Number of children: Not on file  . Years of education: Not on file  . Highest education level: Not on file  Occupational History  . Not on file  Tobacco Use  . Smoking status: Never Smoker  . Smokeless tobacco: Never Used  Substance and Sexual Activity  . Alcohol use: No  . Drug use: No  . Sexual activity: Not on file  Other Topics Concern  . Not on file  Social History Narrative  . Not on file   Social Determinants of Health   Financial Resource Strain:   . Difficulty of Paying Living Expenses: Not on file  Food Insecurity:   . Worried About Charity fundraiser in the Last Year: Not on file  . Ran Out of Food in the  Last Year: Not on file  Transportation Needs:   . Lack of Transportation (Medical): Not on file  . Lack of Transportation (Non-Medical): Not on file  Physical Activity:   . Days of Exercise per Week: Not on file  . Minutes of Exercise per Session: Not on file  Stress:   . Feeling of Stress : Not on file  Social Connections:   . Frequency of Communication with Friends and Family: Not on file  . Frequency of Social Gatherings with Friends and Family: Not on file  . Attends Religious Services: Not on file  . Active Member of Clubs or Organizations: Not on file  . Attends Archivist Meetings: Not on file  . Marital Status: Not on file  Intimate Partner Violence:   . Fear of Current or Ex-Partner: Not on file  . Emotionally Abused: Not on file  . Physically Abused: Not on file  . Sexually Abused: Not on file    Outpatient Medications Prior to Visit  Medication Sig Dispense Refill  . atorvastatin (LIPITOR) 20 MG tablet Take 1 tablet (20 mg total) by mouth daily. 90 tablet 1  . losartan-hydrochlorothiazide (HYZAAR) 100-12.5 MG tablet Take 1 tablet by mouth daily. 90 tablet 1  . Multiple Vitamin (  MULTIVITAMIN) tablet Take 1 tablet by mouth daily.     No facility-administered medications prior to visit.    No Known Allergies  Review of Systems  Constitutional: Negative.   HENT: Negative.   Eyes: Negative.   Respiratory: Negative.   Cardiovascular: Negative.   Gastrointestinal:       Per HPI  Endocrine:       She has noticed some changes in her hair  Genitourinary: Positive for flank pain and urgency.  Skin: Negative.   Allergic/Immunologic: Negative.   Neurological: Negative.   Hematological: Negative.   Psychiatric/Behavioral: Negative.        Objective:    Physical Exam Vitals reviewed.  Constitutional:      Appearance: Normal appearance.  HENT:     Head: Normocephalic.     Nose: Nose normal.     Mouth/Throat:     Mouth: Mucous membranes are moist.      Pharynx: Oropharynx is clear.  Cardiovascular:     Rate and Rhythm: Normal rate and regular rhythm.     Pulses: Normal pulses.     Heart sounds: Normal heart sounds.  Pulmonary:     Effort: Pulmonary effort is normal.     Breath sounds: Normal breath sounds.  Abdominal:     Palpations: Abdomen is soft.     Comments: Hypoactive bowel sounds with tenderness to upper left and right quadrant  previous lap chole  Musculoskeletal:        General: Normal range of motion.     Cervical back: Normal range of motion and neck supple.  Skin:    General: Skin is warm and dry.  Neurological:     Mental Status: She is alert and oriented to person, place, and time.  Psychiatric:        Mood and Affect: Mood normal.        Behavior: Behavior normal.        Thought Content: Thought content normal.        Judgment: Judgment normal.     BP 129/76 (BP Location: Right Arm, Patient Position: Sitting, Cuff Size: Normal)   Pulse 93   Temp 98.7 F (37.1 C) (Oral)   Resp 16   Ht 5\' 4"  (1.626 m)   Wt 172 lb (78 kg)   SpO2 100%   BMI 29.52 kg/m  Wt Readings from Last 3 Encounters:  12/29/19 172 lb (78 kg)  04/28/19 190 lb (86.2 kg)  09/24/17 185 lb (83.9 kg)    Health Maintenance Due  Topic Date Due  . PAP SMEAR-Modifier  09/02/1991    There are no preventive care reminders to display for this patient.   Lab Results  Component Value Date   TSH 1.526 06/25/2009   Lab Results  Component Value Date   WBC 5.3 04/28/2019   HGB 14.1 04/28/2019   HCT 43.3 04/28/2019   MCV 89 04/28/2019   PLT 126 (L) 04/28/2019   Lab Results  Component Value Date   NA 140 04/28/2019   K 3.9 04/28/2019   CO2 25 04/28/2019   GLUCOSE 91 04/28/2019   BUN 10 04/28/2019   CREATININE 0.79 04/28/2019   BILITOT 0.4 04/28/2019   ALKPHOS 81 04/28/2019   AST 19 04/28/2019   ALT 11 04/28/2019   PROT 7.5 04/28/2019   ALBUMIN 4.4 04/28/2019   CALCIUM 9.7 04/28/2019   Lab Results  Component Value Date    CHOL 240 (H) 04/28/2019   Lab Results  Component Value Date  HDL 55 04/28/2019   Lab Results  Component Value Date   LDLCALC 165 (H) 04/28/2019   Lab Results  Component Value Date   TRIG 102 04/28/2019   Lab Results  Component Value Date   CHOLHDL 4.4 04/28/2019   Lab Results  Component Value Date   HGBA1C 5.5 09/24/2017       Assessment & Plan:   Problem List Items Addressed This Visit      Unprioritized   Dysthymic disorder   Relevant Orders   Lipid Panel   TSH    Other Visit Diagnoses    Constipation, unspecified constipation type    -  Primary   Abdominal x-ray pending Labs pending Encourage increasing water intake We will decide treatment based on labs and x-ray   Relevant Orders   TSH   DG Abd 2 Views   Essential hypertension       Labs pending Continue with current regimen   Relevant Orders   Comp. Metabolic Panel (12)   CBC with Differential/Platelet       No orders of the defined types were placed in this encounter.    Vevelyn Francois, NP

## 2019-12-29 NOTE — Patient Instructions (Addendum)

## 2019-12-30 LAB — COMP. METABOLIC PANEL (12)
AST: 22 IU/L (ref 0–40)
Albumin/Globulin Ratio: 1.6 (ref 1.2–2.2)
Albumin: 4.6 g/dL (ref 3.8–4.8)
Alkaline Phosphatase: 82 IU/L (ref 39–117)
BUN/Creatinine Ratio: 18 (ref 9–23)
BUN: 15 mg/dL (ref 6–24)
Bilirubin Total: 0.4 mg/dL (ref 0.0–1.2)
Calcium: 9.9 mg/dL (ref 8.7–10.2)
Chloride: 105 mmol/L (ref 96–106)
Creatinine, Ser: 0.85 mg/dL (ref 0.57–1.00)
GFR calc Af Amer: 93 mL/min/{1.73_m2} (ref >59)
GFR calc non Af Amer: 81 mL/min/{1.73_m2} (ref >59)
Globulin, Total: 2.9 g/dL (ref 1.5–4.5)
Glucose: 91 mg/dL (ref 65–99)
Potassium: 4 mmol/L (ref 3.5–5.2)
Sodium: 143 mmol/L (ref 134–144)
Total Protein: 7.5 g/dL (ref 6.0–8.5)

## 2019-12-30 LAB — CBC WITH DIFFERENTIAL/PLATELET
Basophils Absolute: 0 10*3/uL (ref 0.0–0.2)
Basos: 1 %
EOS (ABSOLUTE): 0 10*3/uL (ref 0.0–0.4)
Eos: 0 %
Hematocrit: 43.2 % (ref 34.0–46.6)
Hemoglobin: 14.5 g/dL (ref 11.1–15.9)
Immature Grans (Abs): 0 10*3/uL (ref 0.0–0.1)
Immature Granulocytes: 0 %
Lymphocytes Absolute: 2.1 10*3/uL (ref 0.7–3.1)
Lymphs: 43 %
MCH: 29.7 pg (ref 26.6–33.0)
MCHC: 33.6 g/dL (ref 31.5–35.7)
MCV: 89 fL (ref 79–97)
Monocytes Absolute: 0.3 10*3/uL (ref 0.1–0.9)
Monocytes: 6 %
Neutrophils Absolute: 2.4 10*3/uL (ref 1.4–7.0)
Neutrophils: 50 %
Platelets: 138 10*3/uL — ABNORMAL LOW (ref 150–450)
RBC: 4.88 x10E6/uL (ref 3.77–5.28)
RDW: 12.3 % (ref 11.7–15.4)
WBC: 4.9 10*3/uL (ref 3.4–10.8)

## 2019-12-30 LAB — LIPID PANEL
Chol/HDL Ratio: 3.2 ratio (ref 0.0–4.4)
Cholesterol, Total: 180 mg/dL (ref 100–199)
HDL: 57 mg/dL (ref >39)
LDL Chol Calc (NIH): 107 mg/dL — ABNORMAL HIGH (ref 0–99)
Triglycerides: 87 mg/dL (ref 0–149)
VLDL Cholesterol Cal: 16 mg/dL (ref 5–40)

## 2019-12-30 LAB — TSH: TSH: 1.47 u[IU]/mL (ref 0.450–4.500)

## 2020-01-01 ENCOUNTER — Telehealth: Payer: Self-pay

## 2020-01-01 NOTE — Telephone Encounter (Signed)
-----   Message from Vevelyn Francois, NP sent at 01/01/2020 11:48 AM EST ----- Please make Yvette Franklin aware that her labs were all within a normal range except for her platelet count which is 138 however this is gradually improving.  Would like to repeat platelet count in 3 months or sooner if she shows any sign of bleeding.  Her abdominal x-ray was normal.  If she would like for me to send in the GI referral to start the colonoscopy process for further evaluation of her constipation issues.  I will be glad to do this.  I did discuss this with the patient brieflyThanks

## 2020-01-01 NOTE — Telephone Encounter (Signed)
Called, no answer and voicemail was full.

## 2020-01-01 NOTE — Telephone Encounter (Signed)
Pt's husband Mohommad called back. He was asking for her results, verified he is on her hippa. I made aware that her labs were normal except for her platelet count which was low but gradually improving. Advised we would need to recheck in 3 months or sooner if she has any signs of bleeding. Appointment was scheduled for 3 months. Advised that xray was normal. Asked if she would like referral to GI to start Colonoscopy process we could make referral for her. Her husband says she does not need the referral at this time and has since had good bowel movements. Thanks!

## 2020-02-13 MED FILL — AMOXICILLIN 875 MG TABS: 875 | 10 days supply | Qty: 20 | Fill #0

## 2020-03-06 ENCOUNTER — Other Ambulatory Visit: Payer: Self-pay | Admitting: Internal Medicine

## 2020-03-06 DIAGNOSIS — I10 Essential (primary) hypertension: Secondary | ICD-10-CM

## 2020-04-01 ENCOUNTER — Other Ambulatory Visit: Payer: BLUE CROSS/BLUE SHIELD

## 2020-05-24 ENCOUNTER — Encounter: Payer: Self-pay | Admitting: Nurse Practitioner

## 2020-05-24 ENCOUNTER — Other Ambulatory Visit: Payer: Self-pay

## 2020-05-24 ENCOUNTER — Ambulatory Visit (INDEPENDENT_AMBULATORY_CARE_PROVIDER_SITE_OTHER): Payer: BC Managed Care – PPO | Admitting: Nurse Practitioner

## 2020-05-24 VITALS — BP 127/75 | HR 85 | Temp 98.2°F | Ht 64.0 in | Wt 183.0 lb

## 2020-05-24 DIAGNOSIS — Z0000 Encounter for general adult medical examination without abnormal findings: Secondary | ICD-10-CM | POA: Diagnosis not present

## 2020-05-24 DIAGNOSIS — I10 Essential (primary) hypertension: Secondary | ICD-10-CM

## 2020-05-24 DIAGNOSIS — E782 Mixed hyperlipidemia: Secondary | ICD-10-CM

## 2020-05-24 DIAGNOSIS — L659 Nonscarring hair loss, unspecified: Secondary | ICD-10-CM | POA: Diagnosis not present

## 2020-05-24 MED ORDER — LOSARTAN POTASSIUM-HCTZ 100-12.5 MG PO TABS: 1.0000 | ORAL_TABLET | Freq: Every day | ORAL | 1 refills | Status: DC

## 2020-05-24 MED FILL — LOSARTAN-HCTZ 100-12.5 MG T: 100-12.5 | 30 days supply | Qty: 30 | Fill #0

## 2020-05-24 NOTE — Progress Notes (Signed)
[place], [place]  [address] Phone:  [phone]   Fax:  [phone]   Established Patient Office Visit  Subjective:  Patient ID: Yvette Franklin, female    DOB: 08-10-1971  Age: 49 y.o. MRN: [id number]  CC:  Chief Complaint  Patient presents with  . hair falling out    Feels like it is gettin dryer and dryer, tried to touch as least as possible because of how much fall out.   . Medication Refill    Losartan    HPI Yvette Franklin presents for hair loss. She  has a past medical history of Hypertension.     Alopecia Patient complains of hair loss.  The hair loss is patchy in distribution, with onset approximately several years ago. Patient describes symptoms of hair shedding and hair thinning. Patient denies scalp itch, scalp pain, scalp rash/ lesions, scalp redness and scalp scaling. Patient does not have family history of hair loss.  Patient does not have dietary restrictions. Patient does not wear a high tension hair style. She does wear hair covering a part of her culture. Patient does not have serious medical illnesses or major weight loss during time of hair loss.    Past Medical History:  Diagnosis Date  . Hypertension     Past Surgical History:  Procedure Laterality Date  . CHOLECYSTECTOMY      No family history on file.  Social History   Socioeconomic History  . Marital status: Married    Spouse name: Not on file  . Number of children: Not on file  . Years of education: Not on file  . Highest education level: Not on file  Occupational History  . Not on file  Tobacco Use  . Smoking status: Never Smoker  . Smokeless tobacco: Never Used  Vaping Use  . Vaping Use: Never used  Substance and Sexual Activity  . Alcohol use: No  . Drug use: No  . Sexual activity: Not on file  Other Topics Concern  . Not on file  Social History Narrative  . Not on file   Social Determinants of Health   Financial Resource  Strain:   . Difficulty of Paying Living Expenses:   Food Insecurity:   . Worried About Charity fundraiser in the Last Year:   . Arboriculturist in the Last Year:   Transportation Needs:   . Film/video editor (Medical):   Marland Kitchen Lack of Transportation (Non-Medical):   Physical Activity:   . Days of Exercise per Week:   . Minutes of Exercise per Session:   Stress:   . Feeling of Stress :   Social Connections:   . Frequency of Communication with Friends and Family:   . Frequency of Social Gatherings with Friends and Family:   . Attends Religious Services:   . Active Member of Clubs or Organizations:   . Attends Archivist Meetings:   Marland Kitchen Marital Status:   Intimate Partner Violence:   . Fear of Current or Ex-Partner:   . Emotionally Abused:   Marland Kitchen Physically Abused:   . Sexually Abused:     Outpatient Medications Prior to Visit  Medication Sig Dispense Refill  . Multiple Vitamin (MULTIVITAMIN) tablet Take 1 tablet by mouth daily.    Marland Kitchen losartan-hydrochlorothiazide (HYZAAR) 100-12.5 MG tablet Take 1 tablet by mouth daily. 90 tablet 1  . atorvastatin (LIPITOR) 20 MG tablet Take 1 tablet (20 mg total)  by mouth daily. (Patient not taking: Reported on 05/24/2020) 90 tablet 1   No facility-administered medications prior to visit.    No Known Allergies  ROS Review of Systems    Objective:    Physical Exam Vitals reviewed.  Constitutional:      General: She is not in acute distress.    Appearance: Normal appearance.  HENT:     Head: Normocephalic and atraumatic.   Cardiovascular:     Rate and Rhythm: Regular rhythm.     Pulses: Normal pulses.     Heart sounds: Normal heart sounds.  Pulmonary:     Effort: Pulmonary effort is normal.     Breath sounds: Normal breath sounds.  Abdominal:     General: Bowel sounds are normal.     Palpations: Abdomen is soft.  Musculoskeletal:        General: Normal range of motion.     Cervical back: Normal range of motion.  Skin:     General: Skin is warm and dry.     Capillary Refill: Capillary refill takes less than 2 seconds.  Neurological:     Mental Status: She is alert and oriented to person, place, and time.  Psychiatric:        Mood and Affect: Mood normal.        Behavior: Behavior normal.        Thought Content: Thought content normal.        Judgment: Judgment normal.     BP 127/75   Pulse 85   Temp 98.2 F (36.8 C)   Ht 5\' 4"  (1.626 m)   Wt 183 lb 0.3 oz (83 kg)   SpO2 99%   BMI 31.42 kg/m  Wt Readings from Last 3 Encounters:  05/24/20 183 lb 0.3 oz (83 kg)  12/29/19 172 lb (78 kg)  04/28/19 190 lb (86.2 kg)     Health Maintenance Due  Topic Date Due  . Hepatitis C Screening  Never done  . COVID-19 Vaccine (1) Never done  . PAP SMEAR-Modifier  Never done  . TETANUS/TDAP  01/04/2018    There are no preventive care reminders to display for this patient.  Lab Results  Component Value Date   TSH 1.590 05/24/2020   Lab Results  Component Value Date   WBC 6.6 05/24/2020   HGB 14.5 05/24/2020   HCT 41.4 05/24/2020   MCV 87 05/24/2020   PLT 154 05/24/2020   Lab Results  Component Value Date   NA 140 05/24/2020   K 3.7 05/24/2020   CO2 25 04/28/2019   GLUCOSE 84 05/24/2020   BUN 14 05/24/2020   CREATININE 0.80 05/24/2020   BILITOT 0.3 05/24/2020   ALKPHOS 74 05/24/2020   AST 22 05/24/2020   ALT 11 04/28/2019   PROT 7.5 05/24/2020   ALBUMIN 4.6 05/24/2020   CALCIUM 10.0 05/24/2020   Lab Results  Component Value Date   CHOL 215 (H) 05/24/2020   Lab Results  Component Value Date   HDL 55 05/24/2020   Lab Results  Component Value Date   LDLCALC 134 (H) 05/24/2020   Lab Results  Component Value Date   TRIG 145 05/24/2020   Lab Results  Component Value Date   CHOLHDL 3.9 05/24/2020   Lab Results  Component Value Date   HGBA1C 5.5 09/24/2017      Assessment & Plan:   Problem List Items Addressed This Visit    None    Visit Diagnoses    Essential  hypertension    -  Primary   Relevant Medications   losartan-hydrochlorothiazide (HYZAAR) 100-12.5 MG tablet   Other Relevant Orders   CBC with Differential/Platelet (Completed)   Comp. Metabolic Panel (12) (Completed)   Hair loss       Relevant Orders   TSH (Completed)   Mixed hyperlipidemia       Relevant Medications   losartan-hydrochlorothiazide (HYZAAR) 100-12.5 MG tablet   Other Relevant Orders   Lipid panel (Completed)   Healthcare maintenance       Relevant Orders   Vitamin B12 (Completed)   Magnesium (Completed)      Meds ordered this encounter  Medications  . losartan-hydrochlorothiazide (HYZAAR) 100-12.5 MG tablet    Sig: Take 1 tablet by mouth daily.    Dispense:  90 tablet    Refill:  1    Order Specific Question:   Supervising Provider    Answer:   Tresa Garter [id number]    Follow-up: Return in about 6 months (around 11/24/2020).    Vevelyn Francois, NP

## 2020-05-24 NOTE — Patient Instructions (Signed)
Alopecia Areata, Adult  Alopecia areata is a condition that causes you to lose hair. You may lose hair on your scalp in patches. In some cases, you may lose all the hair on your scalp (alopecia totalis) or all the hair from your face and body (alopecia universalis). Alopecia areata is an autoimmune disease. This means that your body's defense system (immune system) mistakes normal parts of the body for germs or other things that can make you sick. When you have alopecia areata, the immune system attacks the hair follicles. Alopecia areata usually develops in childhood, but it can develop at any age. For some people, their hair grows back on its own and hair loss does not happen again. For others, their hair may fall out and grow back in cycles. The hair loss may last many years. Having this condition can be emotionally difficult, but it is not dangerous. What are the causes? The cause of this condition is not known. What increases the risk? This condition is more likely to develop in people who have:  A family history of alopecia.  A family history of another autoimmune disease, including type 1 diabetes and rheumatoid arthritis.  Asthma and allergies.  Down syndrome. What are the signs or symptoms? Round spots of patchy hair loss on the scalp is the main symptom of this condition. The spots may be mildly itchy. Other symptoms include:  Short dark hairs in the bald patches that are wider at the top (exclamation point hairs).  Dents, white spots, or lines in the fingernails or toenails.  Balding and body hair loss. This is rare. How is this diagnosed? This condition is diagnosed based on your symptoms and family history. Your health care provider will also check your scalp skin, teeth, and nails. Your health care provider may refer you to a specialist in hair and skin disorders (dermatologist). You may also have tests, including:  A hair pull test.  Blood tests or other screening tests  to check for autoimmune diseases, such as thyroid disease or diabetes.  Skin biopsy to confirm the diagnosis.  A procedure to examine the skin with a lighted magnifying instrument (dermoscopy). How is this treated? There is no cure for alopecia areata. Treatment is aimed at promoting the regrowth of hair and preventing the immune system from overreacting. No single treatment is right for all people with alopecia areata. It depends on the type of hair loss you have and how severe it is. Work with your health care provider to find the best treatment for you. Treatment may include:  Having regular checkups to make sure the condition is not getting worse (watchful waiting).  Steroid creams or pills for 6-8 weeks to stop the immune reaction and help hair to regrow more quickly.  Other topical medicines to alter the immune system response and support the hair growth cycle.  Steroid injections.  Therapy and counseling with a support group or therapist if you are having trouble coping with hair loss. Follow these instructions at home:  Learn as much as you can about your condition.  Apply topical creams only as told by your health care provider.  Take over-the-counter and prescription medicines only as told by your health care provider.  Consider getting a wig or products to make hair look fuller or to cover bald spots, if you feel uncomfortable with your appearance.  Get therapy or counseling if you are having a hard time coping with hair loss. Ask your health care provider to recommend  a counselor or support group.  Keep all follow-up visits as told by your health care provider. This is important. Contact a health care provider if:  Your hair loss gets worse, even with treatment.  You have new symptoms.  You are struggling emotionally. Summary  Alopecia areata is an autoimmune condition that makes your body's defense system (immune system) attack the hair follicles. This causes you  to lose hair.  Treatments may include regular checkups to make sure that the condition is not getting worse (watchful waiting), medicines, and steroid injections. This information is not intended to replace advice given to you by your health care provider. Make sure you discuss any questions you have with your health care provider. Document Revised: 10/15/2017 Document Reviewed: 11/20/2016 Elsevier Patient Education  2020 Elsevier Inc.  

## 2020-05-25 LAB — COMP. METABOLIC PANEL (12)
AST: 22 IU/L (ref 0–40)
Albumin/Globulin Ratio: 1.6 (ref 1.2–2.2)
Albumin: 4.6 g/dL (ref 3.8–4.8)
Alkaline Phosphatase: 74 IU/L (ref 48–121)
BUN/Creatinine Ratio: 18 (ref 9–23)
BUN: 14 mg/dL (ref 6–24)
Bilirubin Total: 0.3 mg/dL (ref 0.0–1.2)
Calcium: 10 mg/dL (ref 8.7–10.2)
Chloride: 102 mmol/L (ref 96–106)
Creatinine, Ser: 0.8 mg/dL (ref 0.57–1.00)
GFR calc Af Amer: 100 mL/min/{1.73_m2} (ref >59)
GFR calc non Af Amer: 87 mL/min/{1.73_m2} (ref >59)
Globulin, Total: 2.9 g/dL (ref 1.5–4.5)
Glucose: 84 mg/dL (ref 65–99)
Potassium: 3.7 mmol/L (ref 3.5–5.2)
Sodium: 140 mmol/L (ref 134–144)
Total Protein: 7.5 g/dL (ref 6.0–8.5)

## 2020-05-25 LAB — CBC WITH DIFFERENTIAL/PLATELET
Basophils Absolute: 0 10*3/uL (ref 0.0–0.2)
Basos: 1 %
EOS (ABSOLUTE): 0 10*3/uL (ref 0.0–0.4)
Eos: 0 %
Hematocrit: 41.4 % (ref 34.0–46.6)
Hemoglobin: 14.5 g/dL (ref 11.1–15.9)
Immature Grans (Abs): 0 10*3/uL (ref 0.0–0.1)
Immature Granulocytes: 0 %
Lymphocytes Absolute: 2.6 10*3/uL (ref 0.7–3.1)
Lymphs: 39 %
MCH: 30.5 pg (ref 26.6–33.0)
MCHC: 35 g/dL (ref 31.5–35.7)
MCV: 87 fL (ref 79–97)
Monocytes Absolute: 0.4 10*3/uL (ref 0.1–0.9)
Monocytes: 7 %
Neutrophils Absolute: 3.6 10*3/uL (ref 1.4–7.0)
Neutrophils: 53 %
Platelets: 154 10*3/uL (ref 150–450)
RBC: 4.75 x10E6/uL (ref 3.77–5.28)
RDW: 12.3 % (ref 11.7–15.4)
WBC: 6.6 10*3/uL (ref 3.4–10.8)

## 2020-05-25 LAB — LIPID PANEL
Chol/HDL Ratio: 3.9 ratio (ref 0.0–4.4)
Cholesterol, Total: 215 mg/dL — ABNORMAL HIGH (ref 100–199)
HDL: 55 mg/dL (ref >39)
LDL Chol Calc (NIH): 134 mg/dL — ABNORMAL HIGH (ref 0–99)
Triglycerides: 145 mg/dL (ref 0–149)
VLDL Cholesterol Cal: 26 mg/dL (ref 5–40)

## 2020-05-25 LAB — TSH: TSH: 1.59 u[IU]/mL (ref 0.450–4.500)

## 2020-05-25 LAB — VITAMIN B12: Vitamin B-12: 429 pg/mL (ref 232–1245)

## 2020-05-25 LAB — MAGNESIUM: Magnesium: 2.1 mg/dL (ref 1.6–2.3)

## 2020-07-09 DIAGNOSIS — E669 Obesity, unspecified: Secondary | ICD-10-CM | POA: Diagnosis not present

## 2020-07-09 DIAGNOSIS — L659 Nonscarring hair loss, unspecified: Secondary | ICD-10-CM | POA: Diagnosis not present

## 2020-07-09 DIAGNOSIS — I10 Essential (primary) hypertension: Secondary | ICD-10-CM | POA: Diagnosis not present

## 2020-07-09 DIAGNOSIS — R5383 Other fatigue: Secondary | ICD-10-CM | POA: Diagnosis not present

## 2020-07-28 ENCOUNTER — Other Ambulatory Visit: Payer: Self-pay

## 2020-07-28 ENCOUNTER — Encounter (HOSPITAL_COMMUNITY): Payer: Self-pay

## 2020-07-28 ENCOUNTER — Ambulatory Visit (HOSPITAL_COMMUNITY)
Admission: EM | Admit: 2020-07-28 | Discharge: 2020-07-28 | Disposition: A | Discharge status: Home / Self Care | Payer: BC Managed Care – PPO | Attending: Urgent Care | Admitting: Urgent Care

## 2020-07-28 DIAGNOSIS — L249 Irritant contact dermatitis, unspecified cause: Secondary | ICD-10-CM

## 2020-07-28 DIAGNOSIS — R21 Rash and other nonspecific skin eruption: Secondary | ICD-10-CM

## 2020-07-28 MED ORDER — HYDROXYZINE HCL 25 MG PO TABS: 12.5000 mg | ORAL_TABLET | Freq: Three times a day (TID) | ORAL | 0 refills | Status: DC | PRN

## 2020-07-28 MED ORDER — PREDNISONE 20 MG PO TABS
ORAL_TABLET | ORAL | 0 refills | Status: DC
Start: 2020-07-28 — End: 2020-12-30

## 2020-07-28 NOTE — ED Triage Notes (Signed)
Pt presents with itchy rash all over body x 2 days. Reports trying otc medications that is not really helping.

## 2020-07-28 NOTE — ED Provider Notes (Signed)
[place]   MRN: [id number] DOB: 1971/10/23  Subjective:   Yvette Franklin is a 49 y.o. female presenting for 2-day history of severe pruritic rash with times all over her body worse over her arms, back and face.  Patient states that she recently moved into a new apartment complex and spends time outdoors.  States there is a lot of trees around the area.  She also states that there a lot of 30 spots in her car that she just noticed and has been trying to clean using new wipes.  Apart from that denies any new exposures.  Denies oral or facial swelling, chest tightness, vomiting, belly pain.  Has been using Claritin with minimal relief.  No current facility-administered medications for this encounter.  Current Outpatient Medications:  .  atorvastatin (LIPITOR) 20 MG tablet, Take 1 tablet (20 mg total) by mouth daily. (Patient not taking: Reported on 05/24/2020), Disp: 90 tablet, Rfl: 1 .  losartan-hydrochlorothiazide (HYZAAR) 100-12.5 MG tablet, Take 1 tablet by mouth daily., Disp: 90 tablet, Rfl: 1 .  Multiple Vitamin (MULTIVITAMIN) tablet, Take 1 tablet by mouth daily., Disp: , Rfl:    No Known Allergies  Past Medical History:  Diagnosis Date  . Hypertension      Past Surgical History:  Procedure Laterality Date  . CHOLECYSTECTOMY      Family History  Problem Relation Age of Onset  . Healthy Mother   . Healthy Father     Social History   Tobacco Use  . Smoking status: Never Smoker  . Smokeless tobacco: Never Used  Vaping Use  . Vaping Use: Never used  Substance Use Topics  . Alcohol use: No  . Drug use: No    ROS   Objective:   Vitals: BP 132/88   Pulse 87   Temp 98.2 F (36.8 C)   Resp 19   SpO2 100%   Physical Exam Constitutional:      General: She is not in acute distress.    Appearance: Normal appearance. She is well-developed. She is not ill-appearing, toxic-appearing or diaphoretic.  HENT:     Head: Normocephalic and atraumatic.      Nose: Nose normal.     Mouth/Throat:     Mouth: Mucous membranes are moist.     Pharynx: Oropharynx is clear.     Comments: Airway is patent. Eyes:     General: No scleral icterus.       Right eye: No discharge.        Left eye: No discharge.     Extraocular Movements: Extraocular movements intact.     Conjunctiva/sclera: Conjunctivae normal.     Pupils: Pupils are equal, round, and reactive to light.  Cardiovascular:     Rate and Rhythm: Normal rate and regular rhythm.     Pulses: Normal pulses.     Heart sounds: Normal heart sounds. No murmur heard.  No friction rub. No gallop.   Pulmonary:     Effort: Pulmonary effort is normal. No respiratory distress.     Breath sounds: Normal breath sounds. No stridor. No wheezing, rhonchi or rales.  Skin:    General: Skin is warm and dry.     Findings: Rash (Diffuse urticarial lesions over her torso, arms, face and to a lesser extent her lower legs; multiple excoriations throughout from her scratching) present.  Neurological:     Mental Status: She is alert and oriented to person, place, and time.  Psychiatric:  Mood and Affect: Mood normal.        Behavior: Behavior normal.        Thought Content: Thought content normal.        Judgment: Judgment normal.     Assessment and Plan :   PDMP not reviewed this encounter.  1. Irritant contact dermatitis, unspecified trigger   2. Rash and nonspecific skin eruption     Suspect contact dermatitis due to an unknown offending agent.  Reviewed what poison ivy looks like as I suspect this may be the source given her new home with surrounding plant life.  Patient is to start a 15-day course of prednisone, hydroxyzine for itching. Counseled patient on potential for adverse effects with medications prescribed/recommended today, ER and return-to-clinic precautions discussed, patient verbalized understanding.    Jaynee Eagles, PA-C 07/28/20 1432

## 2020-08-05 DIAGNOSIS — I10 Essential (primary) hypertension: Secondary | ICD-10-CM | POA: Diagnosis not present

## 2020-08-05 DIAGNOSIS — E669 Obesity, unspecified: Secondary | ICD-10-CM | POA: Diagnosis not present

## 2020-08-05 DIAGNOSIS — R5383 Other fatigue: Secondary | ICD-10-CM | POA: Diagnosis not present

## 2020-08-05 DIAGNOSIS — R1013 Epigastric pain: Secondary | ICD-10-CM | POA: Diagnosis not present

## 2020-08-15 DIAGNOSIS — L658 Other specified nonscarring hair loss: Secondary | ICD-10-CM | POA: Diagnosis not present

## 2020-09-09 ENCOUNTER — Other Ambulatory Visit: Payer: Self-pay | Admitting: Internal Medicine

## 2020-09-09 DIAGNOSIS — I10 Essential (primary) hypertension: Secondary | ICD-10-CM

## 2020-09-30 DIAGNOSIS — R5383 Other fatigue: Secondary | ICD-10-CM | POA: Diagnosis not present

## 2020-09-30 DIAGNOSIS — L659 Nonscarring hair loss, unspecified: Secondary | ICD-10-CM | POA: Diagnosis not present

## 2020-09-30 DIAGNOSIS — I10 Essential (primary) hypertension: Secondary | ICD-10-CM | POA: Diagnosis not present

## 2020-09-30 DIAGNOSIS — R1013 Epigastric pain: Secondary | ICD-10-CM | POA: Diagnosis not present

## 2020-10-14 DIAGNOSIS — L659 Nonscarring hair loss, unspecified: Secondary | ICD-10-CM | POA: Diagnosis not present

## 2020-11-25 ENCOUNTER — Ambulatory Visit: Payer: BC Managed Care – PPO | Admitting: Nurse Practitioner

## 2020-11-25 DIAGNOSIS — Z0182 Encounter for allergy testing: Secondary | ICD-10-CM | POA: Diagnosis not present

## 2020-11-25 DIAGNOSIS — L233 Allergic contact dermatitis due to drugs in contact with skin: Secondary | ICD-10-CM | POA: Diagnosis not present

## 2020-11-28 DIAGNOSIS — L233 Allergic contact dermatitis due to drugs in contact with skin: Secondary | ICD-10-CM | POA: Diagnosis not present

## 2020-12-27 ENCOUNTER — Ambulatory Visit: Payer: BC Managed Care – PPO | Admitting: Nurse Practitioner

## 2020-12-30 ENCOUNTER — Encounter: Payer: Self-pay | Admitting: Nurse Practitioner

## 2020-12-30 ENCOUNTER — Other Ambulatory Visit: Payer: Self-pay

## 2020-12-30 ENCOUNTER — Ambulatory Visit (INDEPENDENT_AMBULATORY_CARE_PROVIDER_SITE_OTHER): Payer: BC Managed Care – PPO | Admitting: Nurse Practitioner

## 2020-12-30 VITALS — BP 116/58 | HR 91 | Temp 97.0°F | Resp 16 | Wt 194.0 lb

## 2020-12-30 DIAGNOSIS — E782 Mixed hyperlipidemia: Secondary | ICD-10-CM

## 2020-12-30 DIAGNOSIS — I10 Essential (primary) hypertension: Secondary | ICD-10-CM

## 2020-12-30 MED ORDER — LOSARTAN POTASSIUM-HCTZ 100-12.5 MG PO TABS: 1.0000 | ORAL_TABLET | Freq: Every day | ORAL | 3 refills | Status: DC

## 2020-12-30 NOTE — Progress Notes (Signed)
Established Patient Office Visit  Subjective:  Patient ID: Yvette Franklin, female    DOB: Aug 23, 1971  Age: 50 y.o. MRN: [id number]  CC: No chief complaint on file.   HPI Yvette Franklin presents for follow up. She  has a past medical history of Hypertension.   Hypertension Patient is here for follow-up of elevated blood pressure. She is not exercising, weight is up slightly and is adherent to a low-salt diet. Blood pressure is not monitored at home. Cardiac symptoms: none. Patient denies chest pain, claudication, dyspnea, fatigue, irregular heart beat, lower extremity edema and syncope. Cardiovascular risk factors: dyslipidemia, hypertension, obesity (BMI >= 30 kg/m2) and sedentary lifestyle. Use of agents associated with hypertension: none. History of target organ damage: none.  She admits that she is not taking atrovastatin at this time.  Past Medical History:  Diagnosis Date  . Hypertension     Past Surgical History:  Procedure Laterality Date  . CHOLECYSTECTOMY      Family History  Problem Relation Age of Onset  . Healthy Mother   . Healthy Father     Social History   Socioeconomic History  . Marital status: Married    Spouse name: Not on file  . Number of children: Not on file  . Years of education: Not on file  . Highest education level: Not on file  Occupational History  . Not on file  Tobacco Use  . Smoking status: Never Smoker  . Smokeless tobacco: Never Used  Vaping Use  . Vaping Use: Never used  Substance and Sexual Activity  . Alcohol use: No  . Drug use: No  . Sexual activity: Not on file  Other Topics Concern  . Not on file  Social History Narrative  . Not on file   Social Determinants of Health   Financial Resource Strain: Not on file  Food Insecurity: Not on file  Transportation Needs: Not on file  Physical Activity: Not on file  Stress: Not on file  Social Connections: Not on file  Intimate Partner Violence: Not on file    Outpatient  Medications Prior to Visit  Medication Sig Dispense Refill  . Multiple Vitamin (MULTIVITAMIN) tablet Take 1 tablet by mouth daily.    Marland Kitchen losartan-hydrochlorothiazide (HYZAAR) 100-12.5 MG tablet TAKE 1 TABLET DAILY 90 tablet 3  . atorvastatin (LIPITOR) 20 MG tablet Take 1 tablet (20 mg total) by mouth daily. (Patient not taking: Reported on 05/24/2020) 90 tablet 1  . hydrOXYzine (ATARAX/VISTARIL) 25 MG tablet Take 0.5-1 tablets (12.5-25 mg total) by mouth every 8 (eight) hours as needed for itching. (Patient not taking: Reported on 12/30/2020) 30 tablet 0  . predniSONE (DELTASONE) 20 MG tablet Day 1-5: Take 3 tablets daily. Day 6-10: Take 2 tablets daily. Day 11-15: Take 1 tablet daily. Take tablets with breakfast. (Patient not taking: Reported on 12/30/2020) 30 tablet 0   No facility-administered medications prior to visit.    No Known Allergies  ROS Review of Systems  Constitutional: Negative.   HENT: Negative.   Eyes: Negative.   Respiratory: Negative for shortness of breath.   Cardiovascular: Negative for chest pain, palpitations and leg swelling.  Gastrointestinal: Negative.  Negative for constipation, diarrhea, nausea and vomiting.  Endocrine: Negative.   Genitourinary: Negative.   Musculoskeletal: Negative.   Skin: Negative.   Allergic/Immunologic: Negative.   Neurological: Negative for dizziness, light-headedness and headaches.  Hematological: Negative.   Psychiatric/Behavioral: Negative.       Objective:    Physical Exam  Constitutional:      General: She is not in acute distress.    Appearance: She is not ill-appearing, toxic-appearing or diaphoretic.  HENT:     Head: Normocephalic and atraumatic.     Nose: Nose normal.     Mouth/Throat:     Mouth: Mucous membranes are moist.  Cardiovascular:     Rate and Rhythm: Normal rate and regular rhythm.     Pulses: Normal pulses.     Heart sounds: Normal heart sounds.  Pulmonary:     Effort: Pulmonary effort is normal.      Breath sounds: Normal breath sounds.  Abdominal:     Palpations: Abdomen is soft.  Musculoskeletal:        General: Normal range of motion.     Cervical back: Normal range of motion.  Skin:    General: Skin is warm and dry.     Capillary Refill: Capillary refill takes less than 2 seconds.  Neurological:     General: No focal deficit present.     Mental Status: She is alert and oriented to person, place, and time.  Psychiatric:        Mood and Affect: Mood normal.        Behavior: Behavior normal.        Thought Content: Thought content normal.        Judgment: Judgment normal.     BP (!) 116/58   Pulse 91   Temp (!) 97 F (36.1 C)   Resp 16   Wt 194 lb (88 kg)   SpO2 99%   BMI 33.30 kg/m  Wt Readings from Last 3 Encounters:  12/30/20 194 lb (88 kg)  05/24/20 183 lb 0.3 oz (83 kg)  12/29/19 172 lb (78 kg)     Health Maintenance Due  Topic Date Due  . PAP SMEAR-Modifier  Never done  . COLONOSCOPY (Pts 45-62yrs Insurance coverage will need to be confirmed)  Never done  . TETANUS/TDAP  01/04/2018  . MAMMOGRAM  Never done    There are no preventive care reminders to display for this patient.  Lab Results  Component Value Date   TSH 1.590 05/24/2020   Lab Results  Component Value Date   WBC 6.6 05/24/2020   HGB 14.5 05/24/2020   HCT 41.4 05/24/2020   MCV 87 05/24/2020   PLT 154 05/24/2020   Lab Results  Component Value Date   NA 140 05/24/2020   K 3.7 05/24/2020   CO2 25 04/28/2019   GLUCOSE 84 05/24/2020   BUN 14 05/24/2020   CREATININE 0.80 05/24/2020   BILITOT 0.3 05/24/2020   ALKPHOS 74 05/24/2020   AST 22 05/24/2020   ALT 11 04/28/2019   PROT 7.5 05/24/2020   ALBUMIN 4.6 05/24/2020   CALCIUM 10.0 05/24/2020   Lab Results  Component Value Date   CHOL 215 (H) 05/24/2020   Lab Results  Component Value Date   HDL 55 05/24/2020   Lab Results  Component Value Date   LDLCALC 134 (H) 05/24/2020   Lab Results  Component Value Date   TRIG  145 05/24/2020   Lab Results  Component Value Date   CHOLHDL 3.9 05/24/2020   Lab Results  Component Value Date   HGBA1C 5.5 09/24/2017      Assessment & Plan:   Problem List Items Addressed This Visit   None   Visit Diagnoses    Mixed hyperlipidemia   Stable labs on hold per patient request Dietary modification recommended  since medication is on hold   Relevant Medications   losartan-hydrochlorothiazide (HYZAAR) 100-12.5 MG tablet   Essential hypertension     Stable  Encouraged on going compliance with current medication regimen Encouraged home monitoring and recording BP <130/80 Eating a heart-healthy diet with less salt Encouraged regular physical activity  Recommend Weight loss   Relevant Medications   losartan-hydrochlorothiazide (HYZAAR) 100-12.5 MG tablet      Meds ordered this encounter  Medications  . losartan-hydrochlorothiazide (HYZAAR) 100-12.5 MG tablet    Sig: Take 1 tablet by mouth daily.    Dispense:  90 tablet    Refill:  3    Order Specific Question:   Supervising Provider    Answer:   Tresa Garter [id number]    Follow-up: Return in about 6 months (around 06/29/2021) for Follow up HTN [id number].    Vevelyn Francois, NP

## 2020-12-30 NOTE — Patient Instructions (Signed)
Health Maintenance, Female Adopting a healthy lifestyle and getting preventive care are important in promoting health and wellness. Ask your health care provider about:  The right schedule for you to have regular tests and exams.  Things you can do on your own to prevent diseases and keep yourself healthy. What should I know about diet, weight, and exercise? Eat a healthy diet  Eat a diet that includes plenty of vegetables, fruits, low-fat dairy products, and lean protein.  Do not eat a lot of foods that are high in solid fats, added sugars, or sodium.   Maintain a healthy weight Body mass index (BMI) is used to identify weight problems. It estimates body fat based on height and weight. Your health care provider can help determine your BMI and help you achieve or maintain a healthy weight. Get regular exercise Get regular exercise. This is one of the most important things you can do for your health. Most adults should:  Exercise for at least 150 minutes each week. The exercise should increase your heart rate and make you sweat (moderate-intensity exercise).  Do strengthening exercises at least twice a week. This is in addition to the moderate-intensity exercise.  Spend less time sitting. Even light physical activity can be beneficial. Watch cholesterol and blood lipids Have your blood tested for lipids and cholesterol at 51 years of age, then have this test every 5 years. Have your cholesterol levels checked more often if:  Your lipid or cholesterol levels are high.  You are older than 51 years of age.  You are at high risk for heart disease. What should I know about cancer screening? Depending on your health history and family history, you may need to have cancer screening at various ages. This may include screening for:  Breast cancer.  Cervical cancer.  Colorectal cancer.  Skin cancer.  Lung cancer. What should I know about heart disease, diabetes, and high blood  pressure? Blood pressure and heart disease  High blood pressure causes heart disease and increases the risk of stroke. This is more likely to develop in people who have high blood pressure readings, are of African descent, or are overweight.  Have your blood pressure checked: ? Every 3-5 years if you are 18-39 years of age. ? Every year if you are 40 years old or older. Diabetes Have regular diabetes screenings. This checks your fasting blood sugar level. Have the screening done:  Once every three years after age 40 if you are at a normal weight and have a low risk for diabetes.  More often and at a younger age if you are overweight or have a high risk for diabetes. What should I know about preventing infection? Hepatitis B If you have a higher risk for hepatitis B, you should be screened for this virus. Talk with your health care provider to find out if you are at risk for hepatitis B infection. Hepatitis C Testing is recommended for:  Everyone born from 1945 through 1965.  Anyone with known risk factors for hepatitis C. Sexually transmitted infections (STIs)  Get screened for STIs, including gonorrhea and chlamydia, if: ? You are sexually active and are younger than 51 years of age. ? You are older than 51 years of age and your health care provider tells you that you are at risk for this type of infection. ? Your sexual activity has changed since you were last screened, and you are at increased risk for chlamydia or gonorrhea. Ask your health care provider   if you are at risk.  Ask your health care provider about whether you are at high risk for HIV. Your health care provider may recommend a prescription medicine to help prevent HIV infection. If you choose to take medicine to prevent HIV, you should first get tested for HIV. You should then be tested every 3 months for as long as you are taking the medicine. Pregnancy  If you are about to stop having your period (premenopausal) and  you may become pregnant, seek counseling before you get pregnant.  Take 400 to 800 micrograms (mcg) of folic acid every day if you become pregnant.  Ask for birth control (contraception) if you want to prevent pregnancy. Osteoporosis and menopause Osteoporosis is a disease in which the bones lose minerals and strength with aging. This can result in bone fractures. If you are 13 years old or older, or if you are at risk for osteoporosis and fractures, ask your health care provider if you should:  Be screened for bone loss.  Take a calcium or vitamin D supplement to lower your risk of fractures.  Be given hormone replacement therapy (HRT) to treat symptoms of menopause. Follow these instructions at home: Lifestyle  Do not use any products that contain nicotine or tobacco, such as cigarettes, e-cigarettes, and chewing tobacco. If you need help quitting, ask your health care provider.  Do not use street drugs.  Do not share needles.  Ask your health care provider for help if you need support or information about quitting drugs. Alcohol use  Do not drink alcohol if: ? Your health care provider tells you not to drink. ? You are pregnant, may be pregnant, or are planning to become pregnant.  If you drink alcohol: ? Limit how much you use to 0-1 drink a day. ? Limit intake if you are breastfeeding.  Be aware of how much alcohol is in your drink. In the [place] one drink equals one 12 oz bottle of beer (355 mL), one 5 oz glass of wine (148 mL), or one 1 oz glass of hard liquor (44 mL). General instructions  Schedule regular health, dental, and eye exams.  Stay current with your vaccines.  Tell your health care provider if: ? You often feel depressed. ? You have ever been abused or do not feel safe at home. Summary  Adopting a healthy lifestyle and getting preventive care are important in promoting health and wellness.  Follow your health care provider's instructions about healthy  diet, exercising, and getting tested or screened for diseases.  Follow your health care provider's instructions on monitoring your cholesterol and blood pressure. This information is not intended to replace advice given to you by your health care provider. Make sure you discuss any questions you have with your health care provider. Document Revised: 10/26/2018 Document Reviewed: 10/26/2018 Elsevier Patient Education  2021 [place]. Colonoscopy, Adult A colonoscopy is a procedure to look at the entire large intestine. This procedure is done using a long, thin, flexible tube that has a camera on the end. You may have a colonoscopy:  As a part of normal colorectal screening.  If you have certain symptoms, such as: ? A low number of red blood cells in your blood (anemia). ? Diarrhea that does not go away. ? Pain in your abdomen. ? Blood in your stool. A colonoscopy can help screen for and diagnose medical problems, including:  Tumors.  Extra tissue that grows where mucus forms (polyps).  Inflammation.  Areas of  bleeding. Tell your health care provider about:  Any allergies you have.  All medicines you are taking, including vitamins, herbs, eye drops, creams, and over-the-counter medicines.  Any problems you or family members have had with anesthetic medicines.  Any blood disorders you have.  Any surgeries you have had.  Any medical conditions you have.  Any problems you have had with having bowel movements.  Whether you are pregnant or may be pregnant. What are the risks? Generally, this is a safe procedure. However, problems may occur, including:  Bleeding.  Damage to your intestine.  Allergic reactions to medicines given during the procedure.  Infection. This is rare. What happens before the procedure? Eating and drinking restrictions Follow instructions from your health care provider about eating or drinking restrictions, which may include:  A few days  before the procedure: ? Follow a low-fiber diet. ? Avoid nuts, seeds, dried fruit, raw fruits, and vegetables.  1-3 days before the procedure: ? Eat only gelatin dessert or ice pops. ? Drink only clear liquids, such as water, clear juice, clear broth or bouillon, black coffee or tea, or clear soft drinks or sports drinks. ? Avoid liquids that contain red or purple dye.  The day of the procedure: ? Do not eat solid foods. You may continue to drink clear liquids until up to 2 hours before the procedure. ? Do not eat or drink anything starting 2 hours before the procedure, or within the time period that your health care provider recommends. Bowel prep If you were prescribed a bowel prep to take by mouth (orally) to clean out your colon:  Take it as told by your health care provider. Starting the day before your procedure, you will need to drink a large amount of liquid medicine. The liquid will cause you to have many bowel movements of loose stool until your stool becomes almost clear or light green.  If your skin or the opening between the buttocks (anus) gets irritated from diarrhea, you may relieve the irritation using: ? Wipes with medicine in them, such as adult wet wipes with aloe and vitamin E. ? A product to soothe skin, such as petroleum jelly.  If you vomit while drinking the bowel prep: ? Take a break for up to 60 minutes. ? Begin the bowel prep again. ? Call your health care provider if you keep vomiting or you cannot take the bowel prep without vomiting.  To clean out your colon, you may also be given: ? Laxative medicines. These help you have a bowel movement. ? Instructions for enema use. An enema is liquid medicine injected into your rectum. Medicines Ask your health care provider about:  Changing or stopping your regular medicines or supplements. This is especially important if you are taking iron supplements, diabetes medicines, or blood thinners.  Taking medicines  such as aspirin and ibuprofen. These medicines can thin your blood. Do not take these medicines unless your health care provider tells you to take them.  Taking over-the-counter medicines, vitamins, herbs, and supplements. General instructions  Ask your health care provider what steps will be taken to help prevent infection. These may include washing skin with a germ-killing soap.  Plan to have someone take you home from the hospital or clinic. What happens during the procedure?  An IV will be inserted into one of your veins.  You may be given one or more of the following: ? A medicine to help you relax (sedative). ? A medicine to numb the  area (local anesthetic). ? A medicine to make you fall asleep (general anesthetic). This is rarely needed.  You will lie on your side with your knees bent.  The tube will: ? Have oil or gel put on it (be lubricated). ? Be inserted into your anus. ? Be gently eased through all parts of your large intestine.  Air will be sent into your colon to keep it open. This may cause some pressure or cramping.  Images will be taken with the camera and will appear on a screen.  A small tissue sample may be removed to be looked at under a microscope (biopsy). The tissue may be sent to a lab for testing if any signs of problems are found.  If small polyps are found, they may be removed and checked for cancer cells.  When the procedure is finished, the tube will be removed. The procedure may vary among health care providers and hospitals.   What happens after the procedure?  Your blood pressure, heart rate, breathing rate, and blood oxygen level will be monitored until you leave the hospital or clinic.  You may have a small amount of blood in your stool.  You may pass gas and have mild cramping or bloating in your abdomen. This is caused by the air that was used to open your colon during the exam.  Do not drive for 24 hours after the procedure.  It is up  to you to get the results of your procedure. Ask your health care provider, or the department that is doing the procedure, when your results will be ready. Summary  A colonoscopy is a procedure to look at the entire large intestine.  Follow instructions from your health care provider about eating and drinking before the procedure.  If you were prescribed an oral bowel prep to clean out your colon, take it as told by your health care provider.  During the colonoscopy, a flexible tube with a camera on its end is inserted into the anus and then passed into the other parts of the large intestine. This information is not intended to replace advice given to you by your health care provider. Make sure you discuss any questions you have with your health care provider. Document Revised: 05/26/2019 Document Reviewed: 05/26/2019 Elsevier Patient Education  [place].

## 2020-12-30 NOTE — Progress Notes (Signed)
Follow up.

## 2021-06-30 ENCOUNTER — Ambulatory Visit (INDEPENDENT_AMBULATORY_CARE_PROVIDER_SITE_OTHER): Payer: BC Managed Care – PPO | Admitting: Nurse Practitioner

## 2021-06-30 ENCOUNTER — Encounter: Payer: Self-pay | Admitting: Nurse Practitioner

## 2021-06-30 ENCOUNTER — Other Ambulatory Visit: Payer: Self-pay

## 2021-06-30 ENCOUNTER — Other Ambulatory Visit (HOSPITAL_COMMUNITY): Payer: Self-pay

## 2021-06-30 VITALS — BP 134/88 | HR 81 | Temp 98.0°F | Ht 64.0 in | Wt 188.0 lb

## 2021-06-30 DIAGNOSIS — M542 Cervicalgia: Secondary | ICD-10-CM

## 2021-06-30 DIAGNOSIS — Z13220 Encounter for screening for lipoid disorders: Secondary | ICD-10-CM

## 2021-06-30 DIAGNOSIS — M549 Dorsalgia, unspecified: Secondary | ICD-10-CM | POA: Diagnosis not present

## 2021-06-30 DIAGNOSIS — G8929 Other chronic pain: Secondary | ICD-10-CM

## 2021-06-30 DIAGNOSIS — I10 Essential (primary) hypertension: Secondary | ICD-10-CM | POA: Diagnosis not present

## 2021-06-30 DIAGNOSIS — Z1321 Encounter for screening for nutritional disorder: Secondary | ICD-10-CM | POA: Diagnosis not present

## 2021-06-30 MED ORDER — METRONIDAZOLE 0.75 % EX CREA
TOPICAL_CREAM | Freq: Two times a day (BID) | CUTANEOUS | 0 refills | Status: DC
  Filled 2021-06-30: qty 45, fill #0

## 2021-06-30 MED ORDER — METRONIDAZOLE 0.75 % EX CREA
TOPICAL_CREAM | Freq: Two times a day (BID) | CUTANEOUS | 2 refills | Status: DC
  Filled 2021-06-30: qty 45, 30d supply, fill #0
  Filled 2021-08-31: qty 45, 30d supply, fill #1
  Filled 2021-10-29: qty 45, 30d supply, fill #2

## 2021-06-30 MED ORDER — LOSARTAN POTASSIUM-HCTZ 100-12.5 MG PO TABS
1.0000 | ORAL_TABLET | Freq: Every day | ORAL | 3 refills | Status: DC
  Filled 2021-06-30: qty 30, 30d supply, fill #0
  Filled 2021-08-31: qty 30, 30d supply, fill #1
  Filled 2021-09-25: qty 30, 30d supply, fill #2
  Filled 2021-10-29: qty 30, 30d supply, fill #3
  Filled 2021-12-01: qty 30, 30d supply, fill #4
  Filled 2022-01-16: qty 30, 30d supply, fill #5
  Filled 2022-03-02: qty 30, 30d supply, fill #6
  Filled 2022-03-30: qty 30, 30d supply, fill #7
  Filled 2022-05-06: qty 30, 30d supply, fill #8

## 2021-06-30 NOTE — Patient Instructions (Signed)
Arthritis Arthritis means joint pain. It can also mean joint disease. A joint is a placewhere bones come together. There are more than 100 types of arthritis. What are the causes? This condition may be caused by: Wear and tear of a joint. This is the most common cause. A lot of acid in the blood, which leads to pain in the joint (gout). Pain and swelling (inflammation) in a joint. Infection of a joint. Injuries in the joint. A reaction to medicines (allergy). In some cases, the cause may not be known. What are the signs or symptoms? Symptoms of this condition include: Redness at a joint. Swelling at a joint. Stiffness at a joint. Warmth coming from the joint. A fever. A feeling of being sick. How is this treated? This condition may be treated with: Treating the cause, if it is known. Rest. Raising (elevating) the joint. Putting cold or hot packs on the joint. Medicines to treat symptoms and reduce pain and swelling. Shots of medicines (cortisone) into the joint. You may also be told to make changes in your life, such as doing exercises andlosing weight. Follow these instructions at home: Medicines Take over-the-counter and prescription medicines only as told by your doctor. Do not take aspirin for pain if your doctor says that you may have gout. Activity Rest your joint if your doctor tells you to. Avoid activities that make the pain worse. Exercise your joint regularly as told by your doctor. Try doing exercises like: Swimming. Water aerobics. Biking. Walking. Managing pain, stiffness, and swelling     If told, put ice on the affected area. Put ice in a plastic bag. Place a towel between your skin and the bag. Leave the ice on for 20 minutes, 2-3 times per day. If your joint is swollen, raise (elevate) it above the level of your heart if told by your doctor. If your joint feels stiff in the morning, try taking a warm shower. If told, put heat on the affected area.  Do this as often as told by your doctor. Use the heat source that your doctor recommends, such as a moist heat pack or a heating pad. If you have diabetes, do not apply heat without asking your doctor. To apply heat: Place a towel between your skin and the heat source. Leave the heat on for 20-30 minutes. Remove the heat if your skin turns bright red. This is very important if you are unable to feel pain, heat, or cold. You may have a greater risk of getting burned. General instructions Do not use any products that contain nicotine or tobacco, such as cigarettes, e-cigarettes, and chewing tobacco. If you need help quitting, ask your doctor. Keep all follow-up visits as told by your doctor. This is important. Contact a doctor if: The pain gets worse. You have a fever. Get help right away if: You have very bad pain in your joint. You have swelling in your joint. Your joint is red. Many joints become painful and swollen. You have very bad back pain. Your leg is very weak. You cannot control your pee (urine) or poop (stool). Summary Arthritis means joint pain. It can also mean joint disease. A joint is a place where bones come together. The most common cause of this condition is wear and tear of a joint. Symptoms of this condition include redness, swelling, or stiffness of the joint. This condition is treated with rest, raising the joint, medicines, and putting cold or hot packs on the joint. Follow your  doctor's instructions about medicines, activity, exercises, and other home care treatments. This information is not intended to replace advice given to you by your health care provider. Make sure you discuss any questions you have with your healthcare provider. Document Revised: 10/10/2018 Document Reviewed: 10/10/2018 Elsevier Patient Education  2022 [employer].

## 2021-06-30 NOTE — Progress Notes (Signed)
Yvette Franklin, [place]  [address] Phone:  [phone]   Fax:  [phone]   Established Patient Office Visit  Subjective:  Patient ID: Yvette Franklin, female    DOB: [birth year]  Age: 50 y.o. MRN: [id number]  CC:  Chief Complaint  Patient presents with   Follow-up    6 month follow up, neck/back pain has taken ibuprofen, tylenol and pain relief cream with some relief.      HPI Yvette Franklin presents for follow up. She  has a past medical history of Hypertension.   She is having increased pain in her neck and upper back. She denies any numbness and tingling. She reports that she moved a few weeks ago.  She denies any injury.  She reports this has been going on for some time.  She has been taking OTC NSAIDs with not much relief.  She feels like the pain is what has her blood pressure slightly elevated.  She reports that she does take her medication as directed.  She does not monitor her blood pressure at home. Denies headache, dizziness, visual changes, shortness of breath, dyspnea on exertion, chest pain, nausea, vomiting or any edema.    Past Medical History:  Diagnosis Date   Hypertension     Past Surgical History:  Procedure Laterality Date   CHOLECYSTECTOMY      Family History  Problem Relation Age of Onset   Healthy Mother    Healthy Father     Social History   Socioeconomic History   Marital status: Married    Spouse name: Not on file   Number of children: Not on file   Years of education: Not on file   Highest education level: Not on file  Occupational History   Not on file  Tobacco Use   Smoking status: Never   Smokeless tobacco: Never  Vaping Use   Vaping Use: Never used  Substance and Sexual Activity   Alcohol use: No   Drug use: No   Sexual activity: Not on file  Other Topics Concern   Not on file  Social History Narrative   Not on file   Social Determinants of Health   Financial Resource Strain:  Not on file  Food Insecurity: Not on file  Transportation Needs: Not on file  Physical Activity: Not on file  Stress: Not on file  Social Connections: Not on file  Intimate Partner Violence: Not on file    Outpatient Medications Prior to Visit  Medication Sig Dispense Refill   BIOTIN-CHOLINE-SILICON PO Take by mouth.     Zinc 50 MG TABS Take by mouth.     losartan-hydrochlorothiazide (HYZAAR) 100-12.5 MG tablet Take 1 tablet by mouth daily. 90 tablet 3   Multiple Vitamin (MULTIVITAMIN) tablet Take 1 tablet by mouth daily.     No facility-administered medications prior to visit.    No Known Allergies  ROS Review of Systems  Constitutional:        Recurrent rash which was treated with metronidazole     Objective:    Physical Exam Constitutional:      General: She is not in acute distress.    Appearance: She is not ill-appearing, toxic-appearing or diaphoretic.  HENT:     Head: Normocephalic and atraumatic.     Nose: Nose normal.     Mouth/Throat:     Mouth: Mucous membranes are moist.  Cardiovascular:     Rate and Rhythm: Normal  rate and regular rhythm.     Pulses: Normal pulses.     Heart sounds: Normal heart sounds.  Pulmonary:     Effort: Pulmonary effort is normal.     Breath sounds: Normal breath sounds.  Abdominal:     Palpations: Abdomen is soft.  Musculoskeletal:        General: Normal range of motion.     Cervical back: Normal range of motion.  Skin:    General: Skin is warm and dry.     Capillary Refill: Capillary refill takes less than 2 seconds.  Neurological:     General: No focal deficit present.     Mental Status: She is alert and oriented to person, place, and time.  Psychiatric:        Mood and Affect: Mood normal.        Behavior: Behavior normal.        Thought Content: Thought content normal.        Judgment: Judgment normal.    BP 134/88 (BP Location: Right Arm, Patient Position: Standing)   Pulse 81   Temp 98 F (36.7 C)   Ht 5'  4" (1.626 m)   Wt 188 lb 0.6 oz (85.3 kg)   SpO2 100%   BMI 32.28 kg/m  Wt Readings from Last 3 Encounters:  06/30/21 188 lb 0.6 oz (85.3 kg)  12/30/20 194 lb (88 kg)  05/24/20 183 lb 0.3 oz (83 kg)     Health Maintenance Due  Topic Date Due   PAP SMEAR-Modifier  Never done   COLONOSCOPY (Pts 45-35yr Insurance coverage will need to be confirmed)  Never done   TETANUS/TDAP  01/04/2018   MAMMOGRAM  Never done   Zoster Vaccines- Shingrix (1 of 2) Never done   COVID-19 Vaccine (3 - Booster for Pfizer series) 03/15/2021    There are no preventive care reminders to display for this patient.  Lab Results  Component Value Date   TSH 1.590 05/24/2020   Lab Results  Component Value Date   WBC 6.6 05/24/2020   HGB 14.5 05/24/2020   HCT 41.4 05/24/2020   MCV 87 05/24/2020   PLT 154 05/24/2020   Lab Results  Component Value Date   NA 140 05/24/2020   K 3.7 05/24/2020   CO2 25 04/28/2019   GLUCOSE 84 05/24/2020   BUN 14 05/24/2020   CREATININE 0.80 05/24/2020   BILITOT 0.3 05/24/2020   ALKPHOS 74 05/24/2020   AST 22 05/24/2020   ALT 11 04/28/2019   PROT 7.5 05/24/2020   ALBUMIN 4.6 05/24/2020   CALCIUM 10.0 05/24/2020   Lab Results  Component Value Date   CHOL 215 (H) 05/24/2020   Lab Results  Component Value Date   HDL 55 05/24/2020   Lab Results  Component Value Date   LDLCALC 134 (H) 05/24/2020   Lab Results  Component Value Date   TRIG 145 05/24/2020   Lab Results  Component Value Date   CHOLHDL 3.9 05/24/2020   Lab Results  Component Value Date   HGBA1C 5.5 09/24/2017      Assessment & Plan:   Problem List Items Addressed This Visit   None Visit Diagnoses     Chronic neck pain    -  Primary Persistent   Relevant Orders   DG Cervical Spine Complete   Chronic upper back pain     Persistent   Relevant Orders   DG Thoracic Spine 2 View   Screening for cholesterol level  Relevant Orders   Lipid panel (Completed)   Encounter for  vitamin deficiency screening       Relevant Orders   CBC with Differential/Platelet (Completed)   VITAMIN D 25 Hydroxy (Vit-D Deficiency, Fractures) (Completed)   Magnesium (Completed)   Vitamin B12 (Completed)   TSH (Completed)   Essential hypertension     Stable Encouraged on going compliance with current medication regimen Encouraged home monitoring and recording BP <130/80 Eating a heart-healthy diet with less salt Encouraged regular physical activity  Recommend Weight loss   Relevant Medications   losartan-hydrochlorothiazide (HYZAAR) 100-12.5 MG tablet   Other Relevant Orders   Comp. Metabolic Panel (12) (Completed)       Meds ordered this encounter  Medications   DISCONTD: metroNIDAZOLE (METROCREAM) 0.75 % cream    Sig: Apply topically 2 (two) times daily.    Dispense:  45 g    Refill:  0    Order Specific Question:   Supervising Provider    Answer:   Tresa Garter [id number]   losartan-hydrochlorothiazide (HYZAAR) 100-12.5 MG tablet    Sig: Take 1 tablet by mouth daily.    Dispense:  90 tablet    Refill:  3    Order Specific Question:   Supervising Provider    Answer:   Tresa Garter [id number]   metroNIDAZOLE (METROCREAM) 0.75 % cream    Sig: Apply topically 2 (two) times daily.    Dispense:  45 g    Refill:  2    Order Specific Question:   Supervising Provider    Answer:   Tresa Garter [id number]    Follow-up: Return in about 2 months (around 08/30/2021) for [place] [[id number]].    Vevelyn Francois, NP

## 2021-07-01 ENCOUNTER — Ambulatory Visit (HOSPITAL_COMMUNITY)
Admission: RE | Admit: 2021-07-01 | Discharge: 2021-07-01 | Disposition: A | Discharge status: Home / Self Care | Payer: BC Managed Care – PPO | Source: Ambulatory Visit | Attending: Nurse Practitioner | Admitting: Nurse Practitioner

## 2021-07-01 ENCOUNTER — Encounter: Payer: Self-pay | Admitting: Nurse Practitioner

## 2021-07-01 DIAGNOSIS — M542 Cervicalgia: Secondary | ICD-10-CM | POA: Insufficient documentation

## 2021-07-01 DIAGNOSIS — M546 Pain in thoracic spine: Secondary | ICD-10-CM | POA: Diagnosis not present

## 2021-07-01 DIAGNOSIS — M549 Dorsalgia, unspecified: Secondary | ICD-10-CM | POA: Diagnosis not present

## 2021-07-01 DIAGNOSIS — G8929 Other chronic pain: Secondary | ICD-10-CM | POA: Diagnosis not present

## 2021-07-01 LAB — CBC WITH DIFFERENTIAL/PLATELET
Basophils Absolute: 0 10*3/uL (ref 0.0–0.2)
Basos: 1 %
EOS (ABSOLUTE): 0 10*3/uL (ref 0.0–0.4)
Eos: 1 %
Hematocrit: 42.7 % (ref 34.0–46.6)
Hemoglobin: 14.2 g/dL (ref 11.1–15.9)
Immature Grans (Abs): 0 10*3/uL (ref 0.0–0.1)
Immature Granulocytes: 0 %
Lymphocytes Absolute: 1.9 10*3/uL (ref 0.7–3.1)
Lymphs: 40 %
MCH: 29.5 pg (ref 26.6–33.0)
MCHC: 33.3 g/dL (ref 31.5–35.7)
MCV: 89 fL (ref 79–97)
Monocytes Absolute: 0.4 10*3/uL (ref 0.1–0.9)
Monocytes: 8 %
Neutrophils Absolute: 2.5 10*3/uL (ref 1.4–7.0)
Neutrophils: 50 %
Platelets: 255 10*3/uL (ref 150–450)
RBC: 4.81 x10E6/uL (ref 3.77–5.28)
RDW: 12.4 % (ref 11.7–15.4)
WBC: 4.9 10*3/uL (ref 3.4–10.8)

## 2021-07-01 LAB — COMP. METABOLIC PANEL (12)
AST: 17 IU/L (ref 0–40)
Albumin/Globulin Ratio: 1.7 (ref 1.2–2.2)
Albumin: 4.3 g/dL (ref 3.8–4.8)
Alkaline Phosphatase: 86 IU/L (ref 44–121)
BUN/Creatinine Ratio: 17 (ref 9–23)
BUN: 14 mg/dL (ref 6–24)
Bilirubin Total: 0.4 mg/dL (ref 0.0–1.2)
Calcium: 9.7 mg/dL (ref 8.7–10.2)
Chloride: 106 mmol/L (ref 96–106)
Creatinine, Ser: 0.84 mg/dL (ref 0.57–1.00)
Globulin, Total: 2.6 g/dL (ref 1.5–4.5)
Glucose: 90 mg/dL (ref 65–99)
Potassium: 4.3 mmol/L (ref 3.5–5.2)
Sodium: 143 mmol/L (ref 134–144)
Total Protein: 6.9 g/dL (ref 6.0–8.5)
eGFR: 85 mL/min/{1.73_m2} (ref >59)

## 2021-07-01 LAB — LIPID PANEL
Chol/HDL Ratio: 3.9 ratio (ref 0.0–4.4)
Cholesterol, Total: 201 mg/dL — ABNORMAL HIGH (ref 100–199)
HDL: 51 mg/dL (ref >39)
LDL Chol Calc (NIH): 135 mg/dL — ABNORMAL HIGH (ref 0–99)
Triglycerides: 82 mg/dL (ref 0–149)
VLDL Cholesterol Cal: 15 mg/dL (ref 5–40)

## 2021-07-01 LAB — VITAMIN B12: Vitamin B-12: 313 pg/mL (ref 232–1245)

## 2021-07-01 LAB — TSH: TSH: 1.05 u[IU]/mL (ref 0.450–4.500)

## 2021-07-01 LAB — VITAMIN D 25 HYDROXY (VIT D DEFICIENCY, FRACTURES): Vit D, 25-Hydroxy: 23.2 ng/mL — ABNORMAL LOW (ref 30.0–100.0)

## 2021-07-01 LAB — MAGNESIUM: Magnesium: 2.1 mg/dL (ref 1.6–2.3)

## 2021-07-04 ENCOUNTER — Other Ambulatory Visit (HOSPITAL_COMMUNITY): Payer: Self-pay

## 2021-07-04 ENCOUNTER — Other Ambulatory Visit: Payer: Self-pay | Admitting: Nurse Practitioner

## 2021-07-04 MED ORDER — MELOXICAM 15 MG PO TABS
15.0000 mg | ORAL_TABLET | Freq: Every day | ORAL | 1 refills | Status: DC
  Filled 2021-07-04: qty 30, 30d supply, fill #0

## 2021-07-04 NOTE — Progress Notes (Signed)
   [facility] [address] [place], [place]  [address] Phone:  [phone]   Fax:  [phone] 

## 2021-09-01 ENCOUNTER — Ambulatory Visit: Payer: BC Managed Care – PPO | Admitting: Nurse Practitioner

## 2021-09-01 ENCOUNTER — Other Ambulatory Visit (HOSPITAL_COMMUNITY): Payer: Self-pay

## 2021-09-02 ENCOUNTER — Other Ambulatory Visit (HOSPITAL_COMMUNITY): Payer: Self-pay

## 2021-09-25 ENCOUNTER — Other Ambulatory Visit (HOSPITAL_COMMUNITY): Payer: Self-pay

## 2021-09-25 ENCOUNTER — Other Ambulatory Visit: Payer: Self-pay

## 2021-09-25 ENCOUNTER — Ambulatory Visit (INDEPENDENT_AMBULATORY_CARE_PROVIDER_SITE_OTHER): Payer: BC Managed Care – PPO | Admitting: Nurse Practitioner

## 2021-09-25 ENCOUNTER — Encounter: Payer: Self-pay | Admitting: Nurse Practitioner

## 2021-09-25 VITALS — BP 127/81 | HR 99 | Temp 97.4°F | Ht 64.0 in | Wt 190.1 lb

## 2021-09-25 DIAGNOSIS — Z124 Encounter for screening for malignant neoplasm of cervix: Secondary | ICD-10-CM

## 2021-09-25 DIAGNOSIS — Z7189 Other specified counseling: Secondary | ICD-10-CM

## 2021-09-25 DIAGNOSIS — B372 Candidiasis of skin and nail: Secondary | ICD-10-CM | POA: Diagnosis not present

## 2021-09-25 MED ORDER — FLUCONAZOLE 150 MG PO TABS
150.0000 mg | ORAL_TABLET | Freq: Once | ORAL | 0 refills | Status: AC
  Filled 2021-09-25: qty 1, 1d supply, fill #0

## 2021-09-25 NOTE — Progress Notes (Signed)
[facility] [place], [place]  [address] Phone:  [phone]   Fax:  [phone]   Established Patient Office Visit  Subjective:  Patient ID: Yvette Franklin, female    DOB: Oct 31, 1971  Age: 50 y.o. MRN: [id number]  CC:  Chief Complaint  Patient presents with   Gynecologic Exam    PAP;     HPI Yvette Franklin presents for physical. She  has a past medical history of Hypertension.    Past Medical History:  Diagnosis Date   Hypertension     Past Surgical History:  Procedure Laterality Date   CHOLECYSTECTOMY      Family History  Problem Relation Age of Onset   Healthy Mother    Healthy Father     Social History   Socioeconomic History   Marital status: Married    Spouse name: Not on file   Number of children: Not on file   Years of education: Not on file   Highest education level: Not on file  Occupational History   Not on file  Tobacco Use   Smoking status: Never   Smokeless tobacco: Never  Vaping Use   Vaping Use: Never used  Substance and Sexual Activity   Alcohol use: No   Drug use: No   Sexual activity: Not on file  Other Topics Concern   Not on file  Social History Narrative   Not on file   Social Determinants of Health   Financial Resource Strain: Not on file  Food Insecurity: Not on file  Transportation Needs: Not on file  Physical Activity: Not on file  Stress: Not on file  Social Connections: Not on file  Intimate Partner Violence: Not on file    Outpatient Medications Prior to Visit  Medication Sig Dispense Refill   BIOTIN-CHOLINE-SILICON PO Take by mouth.     losartan-hydrochlorothiazide (HYZAAR) 100-12.5 MG tablet Take 1 tablet by mouth daily. 90 tablet 3   metroNIDAZOLE (METROCREAM) 0.75 % cream Apply topically 2 (two) times daily. 45 g 2   Zinc 50 MG TABS Take by mouth.     meloxicam (MOBIC) 15 MG tablet Take 1 tablet by mouth daily. 30 tablet 1   No facility-administered medications prior to  visit.    No Known Allergies  ROS Review of Systems    Objective:    Physical Exam Exam conducted with a chaperone present (skin irritation localized area of shaving.  Limited visibility due to patient's anxiety and pushing out speculum).  Constitutional:      Appearance: She is obese.  Cardiovascular:     Rate and Rhythm: Normal rate.     Pulses: Normal pulses.  Pulmonary:     Effort: Pulmonary effort is normal.  Chest:     Chest wall: No mass or tenderness.  Breasts:    Right: Normal.     Left: Normal.  Musculoskeletal:        General: Normal range of motion.     Cervical back: Normal range of motion.  Lymphadenopathy:     Upper Body:     Right upper body: No supraclavicular, axillary or pectoral adenopathy.     Left upper body: No supraclavicular, axillary or pectoral adenopathy.  Skin:    General: Skin is warm and dry.     Capillary Refill: Capillary refill takes less than 2 seconds.  Neurological:     General: No focal deficit present.     Mental Status: She is  alert and oriented to person, place, and time.  Psychiatric:     Comments: Nervous related to exam    BP 127/81 (BP Location: Right Arm, Patient Position: Sitting)   Pulse 99   Temp (!) 97.4 F (36.3 C)   Ht 5' 4"  (1.626 m)   Wt 190 lb 2 oz (86.2 kg)   SpO2 100%   BMI 32.63 kg/m  Wt Readings from Last 3 Encounters:  09/25/21 190 lb 2 oz (86.2 kg)  06/30/21 188 lb 0.6 oz (85.3 kg)  12/30/20 194 lb (88 kg)     Health Maintenance Due  Topic Date Due   PAP SMEAR-Modifier  Never done   COVID-19 Vaccine (3 - Booster for Pfizer series) 12/10/2020    There are no preventive care reminders to display for this patient.  Lab Results  Component Value Date   TSH 1.050 06/30/2021   Lab Results  Component Value Date   WBC 4.9 06/30/2021   HGB 14.2 06/30/2021   HCT 42.7 06/30/2021   MCV 89 06/30/2021   PLT 255 06/30/2021   Lab Results  Component Value Date   NA 143 06/30/2021   K 4.3  06/30/2021   CO2 25 04/28/2019   GLUCOSE 90 06/30/2021   BUN 14 06/30/2021   CREATININE 0.84 06/30/2021   BILITOT 0.4 06/30/2021   ALKPHOS 86 06/30/2021   AST 17 06/30/2021   ALT 11 04/28/2019   PROT 6.9 06/30/2021   ALBUMIN 4.3 06/30/2021   CALCIUM 9.7 06/30/2021   EGFR 85 06/30/2021   Lab Results  Component Value Date   CHOL 201 (H) 06/30/2021   Lab Results  Component Value Date   HDL 51 06/30/2021   Lab Results  Component Value Date   LDLCALC 135 (H) 06/30/2021   Lab Results  Component Value Date   TRIG 82 06/30/2021   Lab Results  Component Value Date   CHOLHDL 3.9 06/30/2021   Lab Results  Component Value Date   HGBA1C 5.5 09/24/2017      Assessment & Plan:   Problem List Items Addressed This Visit   None Visit Diagnoses     Breast self examination education, encounter for    -  Primary   Relevant Orders   MM Digital Screening   Encounter for Papanicolaou smear for cervical cancer screening       Relevant Orders   IGP, rfx Aptima HPV ASCU   Yeast infection of the skin       Relevant Medications   fluconazole (DIFLUCAN) 150 MG tablet       Meds ordered this encounter  Medications   fluconazole (DIFLUCAN) 150 MG tablet    Sig: Take 1 tablet (150 mg total) by mouth once for 1 dose.    Dispense:  1 tablet    Refill:  0    Order Specific Question:   Supervising Provider    Answer:   Tresa Garter [id number]    Follow-up: No follow-ups on file.    Vevelyn Francois, NP

## 2021-09-25 NOTE — Patient Instructions (Signed)
Preventive Care 92-51 Years Old, Female Preventive care refers to lifestyle choices and visits with your health care provider that can promote health and wellness. Preventive care visits are also called wellness exams. What can I expect for my preventive care visit? Counseling Your health care provider may ask you questions about your: Medical history, including: Past medical problems. Family medical history. Pregnancy history. Current health, including: Menstrual cycle. Method of birth control. Emotional well-being. Home life and relationship well-being. Sexual activity and sexual health. Lifestyle, including: Alcohol, nicotine or tobacco, and drug use. Access to firearms. Diet, exercise, and sleep habits. Work and work Statistician. Sunscreen use. Safety issues such as seatbelt and bike helmet use. Physical exam Your health care provider will check your: Height and weight. These may be used to calculate your BMI (body mass index). BMI is a measurement that tells if you are at a healthy weight. Waist circumference. This measures the distance around your waistline. This measurement also tells if you are at a healthy weight and may help predict your risk of certain diseases, such as type 2 diabetes and high blood pressure. Heart rate and blood pressure. Body temperature. Skin for abnormal spots. What immunizations do I need? Vaccines are usually given at various ages, according to a schedule. Your health care provider will recommend vaccines for you based on your age, medical history, and lifestyle or other factors, such as travel or where you work. What tests do I need? Screening Your health care provider may recommend screening tests for certain conditions. This may include: Lipid and cholesterol levels. Diabetes screening. This is done by checking your blood sugar (glucose) after you have not eaten for a while (fasting). Pelvic exam and Pap test. Hepatitis B test. Hepatitis C  test. HIV (human immunodeficiency virus) test. STI (sexually transmitted infection) testing, if you are at risk. Lung cancer screening. Colorectal cancer screening. Mammogram. Talk with your health care provider about when you should start having regular mammograms. This may depend on whether you have a family history of breast cancer. BRCA-related cancer screening. This may be done if you have a family history of breast, ovarian, tubal, or peritoneal cancers. Bone density scan. This is done to screen for osteoporosis. Talk with your health care provider about your test results, treatment options, and if necessary, the need for more tests. Follow these instructions at home: Eating and drinking  Eat a diet that includes fresh fruits and vegetables, whole grains, lean protein, and low-fat dairy products. Take vitamin and mineral supplements as recommended by your health care provider. Do not drink alcohol if: Your health care provider tells you not to drink. You are pregnant, may be pregnant, or are planning to become pregnant. If you drink alcohol: Limit how much you have to 0-1 drink a day. Know how much alcohol is in your drink. In the [place] one drink equals one 12 oz bottle of beer (355 mL), one 5 oz glass of wine (148 mL), or one 1 oz glass of hard liquor (44 mL). Lifestyle Brush your teeth every morning and night with fluoride toothpaste. Floss one time each day. Exercise for at least 30 minutes 5 or more days each week. Do not use any products that contain nicotine or tobacco. These products include cigarettes, chewing tobacco, and vaping devices, such as e-cigarettes. If you need help quitting, ask your health care provider. Do not use drugs. If you are sexually active, practice safe sex. Use a condom or other form of protection to prevent  STIs. If you do not wish to become pregnant, use a form of birth control. If you plan to become pregnant, see your health care provider for a  prepregnancy visit. Take aspirin only as told by your health care provider. Make sure that you understand how much to take and what form to take. Work with your health care provider to find out whether it is safe and beneficial for you to take aspirin daily. Find healthy ways to manage stress, such as: Meditation, yoga, or listening to music. Journaling. Talking to a trusted person. Spending time with friends and family. Minimize exposure to UV radiation to reduce your risk of skin cancer. Safety Always wear your seat belt while driving or riding in a vehicle. Do not drive: If you have been drinking alcohol. Do not ride with someone who has been drinking. When you are tired or distracted. While texting. If you have been using any mind-altering substances or drugs. Wear a helmet and other protective equipment during sports activities. If you have firearms in your house, make sure you follow all gun safety procedures. Seek help if you have been physically or sexually abused. What's next? Visit your health care provider once a year for an annual wellness visit. Ask your health care provider how often you should have your eyes and teeth checked. Stay up to date on all vaccines. This information is not intended to replace advice given to you by your health care provider. Make sure you discuss any questions you have with your health care provider. Document Revised: 04/30/2021 Document Reviewed: 04/30/2021 Elsevier Patient Education  [place].  Pap Test Why am I having this test? A Pap test, also called a Pap smear, is a screening test to check for signs of: Infection. Cancer of the cervix. The cervix is the lower part of the uterus that opens into the vagina. Changes that may be a sign that cancer is developing (precancerous changes). Women need this test on a regular basis. In general, you should have a Pap test every 3 years until you reach menopause or age 72. Women aged 63-60  may choose to have their Pap test done at the same time as an HPV (human papillomavirus) test every 5 years (instead of every 3 years). Your health care provider may recommend having Pap tests more or less often depending on your medical conditions and past Pap test results. What is being tested? Cervical cells are tested for signs of infection or abnormalities. What kind of sample is taken? Your health care provider will collect a sample of cells from the surface of your cervix. This will be done using a small cotton swab, plastic spatula, or brush that is inserted into your vagina using a tool called a speculum. This sample is often collected during a pelvic exam, when you are lying on your back on an exam table with your feet in footrests (stirrups). In some cases, fluids (secretions) from the cervix or vagina may also be collected. How do I prepare for this test? Be aware of where you are in your menstrual cycle. If you are menstruating on the day of the test, you may be asked to reschedule. You may need to reschedule if you have a known vaginal infection on the day of the test. Follow instructions from your health care provider about: Changing or stopping your regular medicines. Some medicines can cause abnormal test results, such as vaginal medicines and tetracycline. Avoiding douching 2-3 days before or the day  of the test. Tell a health care provider about: Any allergies you have. All medicines you are taking, including vitamins, herbs, eye drops, creams, and over-the-counter medicines. Any bleeding problems you have. Any surgeries you have had. Any medical conditions you have. Whether you are pregnant or may be pregnant. How are the results reported? Your test results will be reported as either abnormal or normal. What do the results mean? A normal test result means that you do not have signs of cancer of the cervix. An abnormal result may mean that you have: Cancer. A Pap test by  itself is not enough to diagnose cancer. You will have more tests done if cancer is suspected. Precancerous changes in your cervix. Inflammation of the cervix. An STI (sexually transmitted infection). A fungal infection. A parasite infection. Talk with your health care provider about what your results mean. In some cases, your health care provider may do more testing to confirm the results. Questions to ask your health care provider Ask your health care provider, or the department that is doing the test: When will my results be ready? How will I get my results? What are my treatment options? What other tests do I need? What are my next steps? Summary In general, women should have a Pap test every 3 years until they reach menopause or age 71. Your health care provider will collect a sample of cells from the surface of your cervix. This will be done using a small cotton swab, plastic spatula, or brush. In some cases, fluids (secretions) from the cervix or vagina may also be collected. This information is not intended to replace advice given to you by your health care provider. Make sure you discuss any questions you have with your health care provider. Document Revised: 01/31/2021 Document Reviewed: 01/31/2021 Elsevier Patient Education  2022 [place] Breast self-awareness is knowing how your breasts look and feel. Doing breast self-awareness is important. It allows you to catch a breast problem early while it is still small and can be treated. All women should do breast self-awareness, including women who have had breast implants. Tell your doctor if you notice a change in your breasts. What you need: A mirror. A well-lit room. How to do a breast self-exam A breast self-exam is one way to learn what is normal for your breasts and to check for changes. To do a breast self-exam: Look for changes  Take off all the clothes above your waist. Stand in front of a  mirror in a room with good lighting. Put your hands on your hips. Push your hands down. Look at your breasts and nipples in the mirror to see if one breast or nipple looks different from the other. Check to see if: The shape of one breast is different. The size of one breast is different. There are wrinkles, dips, and bumps in one breast and not the other. Look at each breast for changes in the skin, such as: Redness. Scaly areas. Look for changes in your nipples, such as: Liquid around the nipples. Bleeding. Dimpling. Redness. A change in where the nipples are. Feel for changes  Lie on your back on the floor. Feel each breast. To do this, follow these steps: Pick a breast to feel. Put the arm closest to that breast above your head. Use your other arm to feel the nipple area of your breast. Feel the area with the pads of your three middle fingers by making small circles with  your fingers. For the first circle, press lightly. For the second circle, press harder. For the third circle, press even harder. Keep making circles with your fingers at the different pressures as you move down your breast. Stop when you feel your ribs. Move your fingers a little toward the center of your body. Start making circles with your fingers again, this time going up until you reach your collarbone. Keep making up-and-down circles until you reach your armpit. Remember to keep using the three pressures. Feel the other breast in the same way. Sit or stand in the tub or shower. With soapy water on your skin, feel each breast the same way you did in step 2 when you were lying on the floor. Write down what you find Writing down what you find can help you remember what to tell your doctor. Write down: What is normal for each breast. Any changes you find in each breast, including: The kind of changes you find. Whether you have pain. Size and location of any lumps. When you last had your menstrual  period. General tips Check your breasts every month. If you are breastfeeding, the best time to check your breasts is after you feed your baby or after you use a breast pump. If you get menstrual periods, the best time to check your breasts is 5-7 days after your menstrual period is over. With time, you will become comfortable with the self-exam, and you will begin to know if there are changes in your breasts. Contact a doctor if you: See a change in the shape or size of your breasts or nipples. See a change in the skin of your breast or nipples, such as red or scaly skin. Have fluid coming from your nipples that is not normal. Find a lump or thick area that was not there before. Have pain in your breasts. Have any concerns about your breast health. Summary Breast self-awareness includes looking for changes in your breasts, as well as feeling for changes within your breasts. Breast self-awareness should be done in front of a mirror in a well-lit room. You should check your breasts every month. If you get menstrual periods, the best time to check your breasts is 5-7 days after your menstrual period is over. Let your doctor know of any changes you see in your breasts, including changes in size, changes on the skin, pain or tenderness, or fluid from your nipples that is not normal. This information is not intended to replace advice given to you by your health care provider. Make sure you discuss any questions you have with your health care provider. Document Revised: 06/21/2018 Document Reviewed: 06/21/2018 Elsevier Patient Education  2022 [place].  Vaginal Yeast Infection, Adult Vaginal yeast infection is a condition that causes vaginal discharge as well as soreness, swelling, and redness (inflammation) of the vagina. This is a common condition. Some women get this infection frequently. What are the causes? This condition is caused by a change in the normal balance of the yeast (Candida)  and normal bacteria that live in the vagina. This change causes an overgrowth of yeast, which causes the inflammation. What increases the risk? The condition is more likely to develop in women who: Take antibiotic medicines. Have diabetes. Take birth control pills. Are pregnant. Douche often. Have a weak body defense system (immune system). Have been taking steroid medicines for a long time. Frequently wear tight clothing. What are the signs or symptoms? Symptoms of this condition include: White, thick, creamy  vaginal discharge. Swelling, itching, redness, and irritation of the vagina. The lips of the vagina (labia) may be affected as well. Pain or a burning feeling while urinating. Pain during sex. How is this diagnosed? This condition is diagnosed based on: Your medical history. A physical exam. A pelvic exam. Your health care provider will examine a sample of your vaginal discharge under a microscope. Your health care provider may send this sample for testing to confirm the diagnosis. How is this treated? This condition is treated with medicine. Medicines may be over-the-counter or prescription. You may be told to use one or more of the following: Medicine that is taken by mouth (orally). Medicine that is applied as a cream (topically). Medicine that is inserted directly into the vagina (suppository). Follow these instructions at home: Take or apply over-the-counter and prescription medicines only as told by your health care provider. Do not use tampons until your health care provider approves. Do not have sex until your infection has cleared. Sex can prolong or worsen your symptoms of infection. Ask your health care provider when it is safe to resume sexual activity. Keep all follow-up visits. This is important. How is this prevented?  Do not wear tight clothes, such as pantyhose or tight pants. Wear breathable cotton underwear. Do not use douches, perfumed soap, creams, or  powders. Wipe from front to back after using the toilet. If you have diabetes, keep your blood sugar levels under control. Ask your health care provider for other ways to prevent yeast infections. Contact a health care provider if: You have a fever. Your symptoms go away and then return. Your symptoms do not get better with treatment. Your symptoms get worse. You have new symptoms. You develop blisters in or around your vagina. You have blood coming from your vagina and it is not your menstrual period. You develop pain in your abdomen. Summary Vaginal yeast infection is a condition that causes discharge as well as soreness, swelling, and redness (inflammation) of the vagina. This condition is treated with medicine. Medicines may be over-the-counter or prescription. Take or apply over-the-counter and prescription medicines only as told by your health care provider. Do not douche. Resume sexual activity or use of tampons as instructed by your health care provider. Contact a health care provider if your symptoms do not get better with treatment or your symptoms go away and then return. This information is not intended to replace advice given to you by your health care provider. Make sure you discuss any questions you have with your health care provider. Document Revised: 01/20/2021 Document Reviewed: 01/20/2021 Elsevier Patient Education  [place].

## 2021-09-29 ENCOUNTER — Other Ambulatory Visit (HOSPITAL_COMMUNITY): Payer: Self-pay

## 2021-09-29 LAB — IGP, RFX APTIMA HPV ASCU

## 2021-10-02 DIAGNOSIS — H25013 Cortical age-related cataract, bilateral: Secondary | ICD-10-CM | POA: Diagnosis not present

## 2021-10-02 DIAGNOSIS — H25012 Cortical age-related cataract, left eye: Secondary | ICD-10-CM | POA: Diagnosis not present

## 2021-10-02 DIAGNOSIS — H04123 Dry eye syndrome of bilateral lacrimal glands: Secondary | ICD-10-CM | POA: Diagnosis not present

## 2021-10-02 DIAGNOSIS — H01009 Unspecified blepharitis unspecified eye, unspecified eyelid: Secondary | ICD-10-CM | POA: Diagnosis not present

## 2021-10-27 DIAGNOSIS — I10 Essential (primary) hypertension: Secondary | ICD-10-CM | POA: Diagnosis not present

## 2021-10-27 DIAGNOSIS — H25013 Cortical age-related cataract, bilateral: Secondary | ICD-10-CM | POA: Diagnosis not present

## 2021-10-27 DIAGNOSIS — Z9049 Acquired absence of other specified parts of digestive tract: Secondary | ICD-10-CM | POA: Diagnosis not present

## 2021-10-27 DIAGNOSIS — H04123 Dry eye syndrome of bilateral lacrimal glands: Secondary | ICD-10-CM | POA: Diagnosis not present

## 2021-10-27 DIAGNOSIS — H25012 Cortical age-related cataract, left eye: Secondary | ICD-10-CM | POA: Diagnosis not present

## 2021-10-27 DIAGNOSIS — H01009 Unspecified blepharitis unspecified eye, unspecified eyelid: Secondary | ICD-10-CM | POA: Diagnosis not present

## 2021-10-27 DIAGNOSIS — E669 Obesity, unspecified: Secondary | ICD-10-CM | POA: Diagnosis not present

## 2021-10-27 DIAGNOSIS — H2512 Age-related nuclear cataract, left eye: Secondary | ICD-10-CM | POA: Diagnosis not present

## 2021-10-27 DIAGNOSIS — Z6832 Body mass index (BMI) 32.0-32.9, adult: Secondary | ICD-10-CM | POA: Diagnosis not present

## 2021-10-29 ENCOUNTER — Other Ambulatory Visit (HOSPITAL_COMMUNITY): Payer: Self-pay

## 2021-10-30 ENCOUNTER — Other Ambulatory Visit (HOSPITAL_COMMUNITY): Payer: Self-pay

## 2021-11-06 DIAGNOSIS — H2511 Age-related nuclear cataract, right eye: Secondary | ICD-10-CM | POA: Insufficient documentation

## 2021-11-12 DIAGNOSIS — H2511 Age-related nuclear cataract, right eye: Secondary | ICD-10-CM | POA: Diagnosis not present

## 2021-11-12 DIAGNOSIS — H25011 Cortical age-related cataract, right eye: Secondary | ICD-10-CM | POA: Diagnosis not present

## 2021-11-12 DIAGNOSIS — E669 Obesity, unspecified: Secondary | ICD-10-CM | POA: Diagnosis not present

## 2021-11-12 DIAGNOSIS — I10 Essential (primary) hypertension: Secondary | ICD-10-CM | POA: Diagnosis not present

## 2021-11-12 DIAGNOSIS — H01009 Unspecified blepharitis unspecified eye, unspecified eyelid: Secondary | ICD-10-CM | POA: Diagnosis not present

## 2021-11-12 DIAGNOSIS — H04123 Dry eye syndrome of bilateral lacrimal glands: Secondary | ICD-10-CM | POA: Diagnosis not present

## 2021-11-14 ENCOUNTER — Ambulatory Visit: Payer: BC Managed Care – PPO

## 2021-12-01 ENCOUNTER — Other Ambulatory Visit: Payer: Self-pay | Admitting: Nurse Practitioner

## 2021-12-01 ENCOUNTER — Other Ambulatory Visit (HOSPITAL_COMMUNITY): Payer: Self-pay

## 2021-12-10 ENCOUNTER — Ambulatory Visit: Payer: BC Managed Care – PPO

## 2021-12-12 ENCOUNTER — Other Ambulatory Visit (HOSPITAL_COMMUNITY): Payer: Self-pay

## 2021-12-19 ENCOUNTER — Ambulatory Visit: Payer: BC Managed Care – PPO

## 2021-12-24 ENCOUNTER — Other Ambulatory Visit (HOSPITAL_COMMUNITY): Payer: Self-pay

## 2021-12-26 ENCOUNTER — Ambulatory Visit: Payer: BC Managed Care – PPO | Admitting: Nurse Practitioner

## 2022-01-12 ENCOUNTER — Other Ambulatory Visit: Payer: Self-pay

## 2022-01-12 ENCOUNTER — Ambulatory Visit
Admission: RE | Admit: 2022-01-12 | Discharge: 2022-01-12 | Disposition: A | Discharge status: Home / Self Care | Payer: BC Managed Care – PPO | Source: Ambulatory Visit | Attending: Nurse Practitioner | Admitting: Nurse Practitioner

## 2022-01-12 DIAGNOSIS — Z7189 Other specified counseling: Secondary | ICD-10-CM

## 2022-01-12 DIAGNOSIS — Z1231 Encounter for screening mammogram for malignant neoplasm of breast: Secondary | ICD-10-CM | POA: Diagnosis not present

## 2022-01-13 ENCOUNTER — Other Ambulatory Visit: Payer: Self-pay | Admitting: Nurse Practitioner

## 2022-01-13 DIAGNOSIS — R928 Other abnormal and inconclusive findings on diagnostic imaging of breast: Secondary | ICD-10-CM

## 2022-01-16 ENCOUNTER — Other Ambulatory Visit (HOSPITAL_COMMUNITY): Payer: Self-pay

## 2022-01-26 ENCOUNTER — Ambulatory Visit: Payer: BC Managed Care – PPO | Admitting: Nurse Practitioner

## 2022-01-27 ENCOUNTER — Ambulatory Visit
Admission: RE | Admit: 2022-01-27 | Discharge: 2022-01-27 | Disposition: A | Discharge status: Home / Self Care | Payer: BC Managed Care – PPO | Source: Ambulatory Visit | Attending: Nurse Practitioner | Admitting: Nurse Practitioner

## 2022-01-27 ENCOUNTER — Other Ambulatory Visit: Payer: Self-pay | Admitting: Nurse Practitioner

## 2022-01-27 DIAGNOSIS — R928 Other abnormal and inconclusive findings on diagnostic imaging of breast: Secondary | ICD-10-CM

## 2022-01-27 DIAGNOSIS — N6489 Other specified disorders of breast: Secondary | ICD-10-CM

## 2022-01-27 DIAGNOSIS — R922 Inconclusive mammogram: Secondary | ICD-10-CM | POA: Diagnosis not present

## 2022-01-27 DIAGNOSIS — R921 Mammographic calcification found on diagnostic imaging of breast: Secondary | ICD-10-CM

## 2022-02-06 DIAGNOSIS — H16223 Keratoconjunctivitis sicca, not specified as Sjogren's, bilateral: Secondary | ICD-10-CM | POA: Diagnosis not present

## 2022-02-06 DIAGNOSIS — H20012 Primary iridocyclitis, left eye: Secondary | ICD-10-CM | POA: Diagnosis not present

## 2022-02-09 ENCOUNTER — Ambulatory Visit
Admission: RE | Admit: 2022-02-09 | Discharge: 2022-02-09 | Disposition: A | Discharge status: Home / Self Care | Payer: BC Managed Care – PPO | Source: Ambulatory Visit | Attending: Nurse Practitioner | Admitting: Nurse Practitioner

## 2022-02-09 DIAGNOSIS — R921 Mammographic calcification found on diagnostic imaging of breast: Secondary | ICD-10-CM

## 2022-02-09 DIAGNOSIS — D0512 Intraductal carcinoma in situ of left breast: Secondary | ICD-10-CM | POA: Diagnosis not present

## 2022-02-09 HISTORY — PX: BREAST BIOPSY: SHX20

## 2022-02-10 ENCOUNTER — Other Ambulatory Visit: Payer: Self-pay | Admitting: Nurse Practitioner

## 2022-02-10 DIAGNOSIS — C50912 Malignant neoplasm of unspecified site of left female breast: Secondary | ICD-10-CM

## 2022-02-11 ENCOUNTER — Telehealth: Payer: Self-pay | Admitting: Hematology and Oncology

## 2022-02-11 NOTE — Telephone Encounter (Signed)
Spoke to patient to confirm afternoon clinic appointment will send packet via e-mail ?

## 2022-02-16 ENCOUNTER — Encounter: Payer: Self-pay | Admitting: *Deleted

## 2022-02-16 DIAGNOSIS — D0512 Intraductal carcinoma in situ of left breast: Secondary | ICD-10-CM | POA: Insufficient documentation

## 2022-02-16 NOTE — Progress Notes (Signed)
?Radiation Oncology         [phone] ?________________________________ ? ?Name: Yvette Franklin        MRN: [id number]  ?Date of Service: 02/18/2022 DOB: 1971/11/14 ? ?PO:EUMP, Diona Foley, NP  Jovita Kussmaul, MD    ? ?REFERRING PHYSICIAN: Jovita Kussmaul, MD ? ? ?DIAGNOSIS: The encounter diagnosis was Ductal carcinoma in situ (DCIS) of left breast. ? ? ?HISTORY OF PRESENT ILLNESS: Yvette Franklin is a 51 y.o. female seen in the multidisciplinary breast clinic for a new diagnosis of left breast cancer. The patient was noted to have screening detected asymmetries in both breasts on her first baseline mammogram. Diagnostic work up showed benign fibrocystic change in the right breast. In the left breast a 1.5 cm group of calcifications were noted. A biopsy on 02/09/22 showed intermediate to high grade DCIS with calcifications that was ER positive, PR negative.  A biopsy also was performed on the right breast this week but results are pending.  She's seen today to discuss treatment recommendations of her cancer. ? ?PREVIOUS RADIATION THERAPY: No ? ? ?PAST MEDICAL HISTORY:  ?Past Medical History:  ?Diagnosis Date  ? Hypertension   ?   ? ? ?PAST SURGICAL HISTORY: ?Past Surgical History:  ?Procedure Laterality Date  ? BREAST BIOPSY Left 02/09/2022  ? BREAST BIOPSY Right 02/18/2022  ? CHOLECYSTECTOMY    ? ? ? ?FAMILY HISTORY:  ?Family History  ?Problem Relation Age of Onset  ? Healthy Mother   ? Healthy Father   ? Breast cancer Neg Hx   ? ? ? ?SOCIAL HISTORY:  reports that she has never smoked. She has never used smokeless tobacco. She reports that she does not drink alcohol and does not use drugs. The patient is married and lives in [place]. She is originally from [place], and has lived in the [place] for about 25 years. She has two sons in their twenties, and her third son is 68. 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 She prefers female providers for her upcoming appointments.  ? ? ?ALLERGIES: Patient has no known  allergies. ? ? ?MEDICATIONS:  ?Current Outpatient Medications  ?Medication Sig Dispense Refill  ? BIOTIN-CHOLINE-SILICON PO Take by mouth.    ? losartan-hydrochlorothiazide (HYZAAR) 100-12.5 MG tablet Take 1 tablet by mouth daily. 90 tablet 3  ? metroNIDAZOLE (METROCREAM) 0.75 % cream Apply topically 2 (two) times daily. 45 g 2  ? Zinc 50 MG TABS Take by mouth.    ? ?No current facility-administered medications for this encounter.  ? ? ? ?REVIEW OF SYSTEMS: On review of systems, the patient reports that she is doing well overall but is a bit sore from her biopsy today. No other breast specific complaints are verbalized.  ? ?  ? ?PHYSICAL EXAM:  ?Wt Readings from Last 3 Encounters:  ?09/25/21 190 lb 2 oz (86.2 kg)  ?06/30/21 188 lb 0.6 oz (85.3 kg)  ?12/30/20 194 lb (88 kg)  ? ?Temp Readings from Last 3 Encounters:  ?09/25/21 (!) 97.4 ?F (36.3 ?C)  ?06/30/21 98 ?F (36.7 ?C)  ?12/30/20 (!) 97 ?F (36.1 ?C)  ? ?BP Readings from Last 3 Encounters:  ?09/25/21 127/81  ?06/30/21 134/88  ?12/30/20 (!) 116/58  ? ?Pulse Readings from Last 3 Encounters:  ?09/25/21 99  ?06/30/21 81  ?12/30/20 91  ? ? ?In general this is a well appearing African female in no acute distress. She's alert and oriented x4 and appropriate throughout the examination. Cardiopulmonary assessment is negative for  acute distress and she exhibits normal effort. Bilateral breast exam is deferred. ? ? ? ?ECOG = 1 ? ?0 - Asymptomatic (Fully active, able to carry on all predisease activities without restriction) ? ?1 - Symptomatic but completely ambulatory (Restricted in physically strenuous activity but ambulatory and able to carry out work of a light or sedentary nature. For example, light housework, office work) ? ?2 - Symptomatic, <50% in bed during the day (Ambulatory and capable of all self care but unable to carry out any work activities. Up and about more than 50% of waking hours) ? ?3 - Symptomatic, >50% in bed, but not bedbound (Capable of only limited  self-care, confined to bed or chair 50% or more of waking hours) ? ?4 - Bedbound (Completely disabled. Cannot carry on any self-care. Totally confined to bed or chair) ? ?5 - Death ? ? Oken MM, Creech RH, Tormey DC, et al. [phone]). "Toxicity and response criteria of the [facility]". [place] Oncol. 5 (6): 649-55 ? ? ? ?LABORATORY DATA:  ?Lab Results  ?Component Value Date  ? WBC 4.9 06/30/2021  ? HGB 14.2 06/30/2021  ? HCT 42.7 06/30/2021  ? MCV 89 06/30/2021  ? PLT 255 06/30/2021  ? ?Lab Results  ?Component Value Date  ? NA 143 06/30/2021  ? K 4.3 06/30/2021  ? CL 106 06/30/2021  ? CO2 25 04/28/2019  ? ?Lab Results  ?Component Value Date  ? ALT 11 04/28/2019  ? AST 17 06/30/2021  ? ALKPHOS 86 06/30/2021  ? BILITOT 0.4 06/30/2021  ? ?  ? ?RADIOGRAPHY: US BREAST LTD UNI LEFT INC AXILLA ? ?Result Date: 01/27/2022 ?CLINICAL DATA:  Patient returns after baseline screening study for evaluation of possible RIGHT breast asymmetry, possible LEFT breast asymmetry, and LEFT breast calcifications. EXAM: DIGITAL DIAGNOSTIC BILATERAL MAMMOGRAM WITH TOMOSYNTHESIS AND CAD; ULTRASOUND LEFT BREAST LIMITED; ULTRASOUND RIGHT BREAST LIMITED TECHNIQUE: Bilateral digital diagnostic mammography and breast tomosynthesis was performed. The images were evaluated with computer-aided detection.; Targeted ultrasound examination of the left breast was performed.; Targeted ultrasound examination of the right breast was performed COMPARISON:  Baseline screening study 01/12/2022 ACR Breast Density Category b: There are scattered areas of fibroglandular density. FINDINGS: RIGHT BREAST: Mammogram: Additional 2-D and 3-D images are performed. These views confirm presence of an oval circumscribed mass in the MEDIAL aspect of the RIGHT breast. Mammographic images were processed with CAD. Ultrasound: Targeted ultrasound is performed, showing a group of microcysts in the 3 o'clock location of the RIGHT breast 4 centimeters from  the nipple which measures 0.4 x 0.6 x 0.4 centimeters. No internal blood flow identified with Doppler evaluation. LEFT BREAST: Mammogram: Additional 2-D and 3-D images are performed. These views confirm presence of asymmetry in the UPPER-OUTER QUADRANT, with superimposed circumscribed oval masses with lucent centers. Magnified views are performed of calcifications in the UPPER-OUTER QUADRANT of the LEFT breast. These views demonstrate faint calcifications in a linear distribution. Some of these have tram track appearance favoring vascular calcifications. However, a 1.5 x 0.7 centimeter group of calcifications in the same region is indeterminate. Mammographic images were processed with CAD. Ultrasound: Targeted ultrasound is performed, showing numerous hypoechoic masses with hyperechoic centers in the UPPER-OUTER QUADRANT of the LEFT breast corresponding to benign intramammary lymph nodes. No suspicious abnormality identified in this region. IMPRESSION: 1. Probable UPPER come metaplasia/fibrocystic changes in the 3 o'clock location of the RIGHT breast warranting follow-up. 2. Asymmetry in the UPPER-OUTER QUADRANT of the LEFT breast is felt to  represent benign breast parenchyma, with superimposed benign intramammary lymph nodes. 3. Indeterminate linear calcifications in the UPPER-OUTER QUADRANT the LEFT breast for which stereotactic biopsy is recommended. RECOMMENDATION: 1. Stereotactic guided core biopsy of LEFT breast calcifications. 2. If biopsy is benign, recommend follow-up bilateral diagnostic mammogram and possible bilateral breast ultrasound in 6 months to assess probably benign apocrine metaplasia in the 3 o'clock location of the RIGHT breast and probably benign asymmetry in the UPPER-OUTER QUADRANT of the LEFT breast. 3. If LEFT breast biopsy is malignant, I would recommend biopsy of lesion in the 3 o'clock location of the RIGHT breast and consider MRI to evaluate extent of disease, given the asymmetry in  the LEFT breast. I have discussed the findings and recommendations with the patient. If applicable, a reminder letter will be sent to the patient regarding the next appointment. BI-RADS CATEGORY  4: Suspicious. Electro

## 2022-02-17 NOTE — Progress Notes (Signed)
[place] ?CONSULT NOTE ? ?Patient Care Team: ?Vevelyn Francois, NP as PCP - General (Adult Health Nurse Practitioner) ?Jovita Kussmaul, MD as Consulting Physician (General Surgery) ?Nicholas Lose, MD as Consulting Physician (Hematology and Oncology) ?Kyung Rudd, MD as Consulting Physician (Radiation Oncology) ?Mauro Kaufmann, RN as Oncology Nurse Navigator ?Rockwell Germany, RN as Oncology Nurse Navigator ? ?CHIEF COMPLAINTS/PURPOSE OF CONSULTATION:  ?Newly diagnosed left breast DCIS ? ?HISTORY OF PRESENTING ILLNESS:  ?Yvette Franklin 50 y.o. female is here because of recent diagnosis of left breast DCIS ?Screening mammogram detected evaluation of possible right breast asymmetry possible left breast asymmetry and left breast classifications. ?Diagnostic mammogram showed probable upper come metaplasia/fibrocystic changes in the 3 o'clock location of the RIGHT breast warranting follow-up. Asymmetry in the UPPER-OUTER QUADRANT of the LEFT breast is felt to represent benign breast parenchyma, with superimposed benign intramammary lymph nodes.Indeterminate linear calcifications in the UPPER-OUTER QUADRANT ?the LEFT breast for which stereotactic biopsy is recommended ?Biopsy showed: High-grade DCIS. Prognostic indicators significant for ER 20% positive; PR 0% negative. ?She was presented this morning to the multidisciplinary conference and she is here at the [facility] clinic to discuss her treatment plan accompanied by her husband. ?  ? ?I reviewed her records extensively and collaborated the history with the patient. ? ?SUMMARY OF ONCOLOGIC HISTORY: ?Oncology History  ?Ductal carcinoma in situ (DCIS) of left breast  ?02/09/2022 Initial Diagnosis  ? Screening mammogram detected left breast asymmetry and calcifications 1.5 cm stereotactic biopsy revealed high-grade DCIS ER 20% weak, PR 0% ?Right breast asymmetry: Ultrasound: Micro 6 6 mm: Biopsy pending ?  ?02/18/2022 Cancer Staging  ? Staging form: Breast, AJCC 8th  Edition ?- Clinical: Stage 0 (cTis (DCIS), cN0, cM0, G3, ER+, PR-, HER2: Not Assessed) - Signed by Nicholas Lose, MD on 02/18/2022 ?Stage prefix: Initial diagnosis ?Histologic grading system: 3 grade system ? ?  ? ? ? ?MEDICAL HISTORY:  ?Past Medical History:  ?Diagnosis Date  ? Breast cancer ([place])   ? Hypertension   ? ? ?SURGICAL HISTORY: ?Past Surgical History:  ?Procedure Laterality Date  ? BREAST BIOPSY Left 02/09/2022  ? BREAST BIOPSY Right 02/18/2022  ? CHOLECYSTECTOMY    ? ? ?SOCIAL HISTORY: ?Social History  ? ?Socioeconomic History  ? Marital status: Married  ?  Spouse name: Not on file  ? Number of children: Not on file  ? Years of education: Not on file  ? Highest education level: Not on file  ?Occupational History  ? Not on file  ?Tobacco Use  ? Smoking status: Never  ? Smokeless tobacco: Never  ?Vaping Use  ? Vaping Use: Never used  ?Substance and Sexual Activity  ? Alcohol use: No  ? Drug use: No  ? Sexual activity: Not on file  ?Other Topics Concern  ? Not on file  ?Social History Narrative  ? Not on file  ? ?Social Determinants of Health  ? ?Financial Resource Strain: Not on file  ?Food Insecurity: Not on file  ?Transportation Needs: Not on file  ?Physical Activity: Not on file  ?Stress: Not on file  ?Social Connections: Not on file  ?Intimate Partner Violence: Not on file  ? ? ?FAMILY HISTORY: ?Family History  ?Problem Relation Age of Onset  ? Healthy Mother   ? Healthy Father   ? Breast cancer Neg Hx   ? ? ?ALLERGIES:  has No Known Allergies. ? ?MEDICATIONS:  ?Current Outpatient Medications  ?Medication Sig Dispense Refill  ? BIOTIN-CHOLINE-SILICON PO Take  by mouth.    ? losartan-hydrochlorothiazide (HYZAAR) 100-12.5 MG tablet Take 1 tablet by mouth daily. 90 tablet 3  ? metroNIDAZOLE (METROCREAM) 0.75 % cream Apply topically 2 (two) times daily. 45 g 2  ? Zinc 50 MG TABS Take by mouth.    ? ?No current facility-administered medications for this visit.  ? ? ?REVIEW OF SYSTEMS:   ?Constitutional:  Denies fevers, chills or abnormal night sweats ?Eyes: Denies blurriness of vision, double vision or watery eyes ?Ears, nose, mouth, throat, and face: Denies mucositis or sore throat ?Respiratory: Denies cough, dyspnea or wheezes ?Cardiovascular: Denies palpitation, chest discomfort or lower extremity swelling ?Gastrointestinal:  Denies nausea, heartburn or change in bowel habits ?Skin: Denies abnormal skin rashes ?Lymphatics: Denies new lymphadenopathy or easy bruising ?Neurological:Denies numbness, tingling or new weaknesses ?Behavioral/Psych: Mood is stable, no new changes  ?Breast:  Denies any palpable lumps or discharge ?All other systems were reviewed with the patient and are negative. ? ?PHYSICAL EXAMINATION: ?ECOG PERFORMANCE STATUS: 0 - Asymptomatic ? ?Vitals:  ? 02/18/22 1236  ?BP: 126/80  ?Pulse: 73  ?Resp: 18  ?Temp: (!) 97 ?F (36.1 ?C)  ?SpO2: 99%  ? ?Filed Weights  ? 02/18/22 1236  ?Weight: 197 lb 12.8 oz (89.7 kg)  ? ?  ? ?LABORATORY DATA:  ?I have reviewed the data as listed ?Lab Results  ?Component Value Date  ? WBC 6.0 02/18/2022  ? HGB 13.7 02/18/2022  ? HCT 41.7 02/18/2022  ? MCV 89.5 02/18/2022  ? PLT 293 02/18/2022  ? ?Lab Results  ?Component Value Date  ? NA 140 02/18/2022  ? K 4.1 02/18/2022  ? CL 104 02/18/2022  ? CO2 30 02/18/2022  ? ? ?RADIOGRAPHIC STUDIES: ?I have personally reviewed the radiological reports and agreed with the findings in the report. ? ?ASSESSMENT AND PLAN:  ?Ductal carcinoma in situ (DCIS) of left breast ?02/09/2022:Screening mammogram detected left breast asymmetry and calcifications 1.5 cm stereotactic biopsy revealed high-grade DCIS ER 20% weak, PR 0% ?Right breast asymmetry: Ultrasound: Micro 6 6 mm: Biopsy done today ? ?Pathology review: I discussed with the patient the difference between DCIS and invasive breast cancer. It is considered a precancerous lesion. DCIS is classified as a 0. It is generally detected through mammograms as calcifications. We discussed the  significance of grades and its impact on prognosis. We also discussed the importance of ER and PR receptors and their implications to adjuvant treatment options. Prognosis of DCIS dependence on grade, comedo necrosis. It is anticipated that if not treated, 20-30% of DCIS can develop into invasive breast cancer. ? ?Recommendation: ?1. Breast conserving surgery ?2. Followed by adjuvant radiation therapy ?3. Followed by antiestrogen therapy with tamoxifen ?5 years ? ?Tamoxifen counseling: We discussed the risks and benefits of tamoxifen. These include but not limited to insomnia, hot flashes, mood changes, vaginal dryness, and weight gain. Although rare, serious side effects including endometrial cancer, risk of blood clots were also discussed. We strongly believe that the benefits far outweigh the risks. Patient understands these risks and consented to starting treatment. Planned treatment duration is 5 years. ? ?Return to clinic after surgery to discuss the final pathology report and come up with an adjuvant treatment plan. ? ? ? ?All questions were answered. The patient knows to call the clinic with any problems, questions or concerns. ?  ? Harriette Ohara, MD ?02/18/22 ? I Gardiner Coins am scribing for Dr. Lindi Adie ? ?I have reviewed the above documentation for accuracy and completeness, and I agree  with the above. ?  ?

## 2022-02-18 ENCOUNTER — Inpatient Hospital Stay (HOSPITAL_BASED_OUTPATIENT_CLINIC_OR_DEPARTMENT_OTHER): Payer: BC Managed Care – PPO | Admitting: Hematology and Oncology

## 2022-02-18 ENCOUNTER — Encounter: Payer: Self-pay | Admitting: *Deleted

## 2022-02-18 ENCOUNTER — Other Ambulatory Visit: Payer: Self-pay

## 2022-02-18 ENCOUNTER — Inpatient Hospital Stay: Payer: BC Managed Care – PPO | Attending: Hematology and Oncology

## 2022-02-18 ENCOUNTER — Ambulatory Visit: Payer: BC Managed Care – PPO | Admitting: Physical Therapy

## 2022-02-18 ENCOUNTER — Telehealth: Payer: Self-pay | Admitting: Genetic Counselor

## 2022-02-18 ENCOUNTER — Ambulatory Visit
Admission: RE | Admit: 2022-02-18 | Discharge: 2022-02-18 | Disposition: A | Discharge status: Home / Self Care | Payer: BC Managed Care – PPO | Source: Ambulatory Visit | Attending: Nurse Practitioner | Admitting: Nurse Practitioner

## 2022-02-18 ENCOUNTER — Ambulatory Visit
Admission: RE | Admit: 2022-02-18 | Discharge: 2022-02-18 | Disposition: A | Discharge status: Home / Self Care | Payer: BC Managed Care – PPO | Source: Ambulatory Visit | Attending: Radiation Oncology | Admitting: Radiation Oncology

## 2022-02-18 ENCOUNTER — Encounter: Payer: Self-pay | Admitting: Hematology and Oncology

## 2022-02-18 ENCOUNTER — Encounter: Payer: Self-pay | Admitting: Emergency Medicine

## 2022-02-18 ENCOUNTER — Encounter: Payer: BC Managed Care – PPO | Admitting: Genetic Counselor

## 2022-02-18 DIAGNOSIS — D0512 Intraductal carcinoma in situ of left breast: Secondary | ICD-10-CM

## 2022-02-18 DIAGNOSIS — Z170 Estrogen receptor positive status [ER+]: Secondary | ICD-10-CM | POA: Diagnosis not present

## 2022-02-18 DIAGNOSIS — N6011 Diffuse cystic mastopathy of right breast: Secondary | ICD-10-CM | POA: Diagnosis not present

## 2022-02-18 DIAGNOSIS — N6315 Unspecified lump in the right breast, overlapping quadrants: Secondary | ICD-10-CM | POA: Diagnosis not present

## 2022-02-18 DIAGNOSIS — C50912 Malignant neoplasm of unspecified site of left female breast: Secondary | ICD-10-CM

## 2022-02-18 DIAGNOSIS — C50412 Malignant neoplasm of upper-outer quadrant of left female breast: Secondary | ICD-10-CM | POA: Diagnosis not present

## 2022-02-18 HISTORY — PX: BREAST BIOPSY: SHX20

## 2022-02-18 LAB — CBC WITH DIFFERENTIAL (CANCER CENTER ONLY)
Abs Immature Granulocytes: 0.02 10*3/uL (ref 0.00–0.07)
Basophils Absolute: 0 10*3/uL (ref 0.0–0.1)
Basophils Relative: 1 %
Eosinophils Absolute: 0 10*3/uL (ref 0.0–0.5)
Eosinophils Relative: 0 %
HCT: 41.7 % (ref 36.0–46.0)
Hemoglobin: 13.7 g/dL (ref 12.0–15.0)
Immature Granulocytes: 0 %
Lymphocytes Relative: 40 %
Lymphs Abs: 2.4 10*3/uL (ref 0.7–4.0)
MCH: 29.4 pg (ref 26.0–34.0)
MCHC: 32.9 g/dL (ref 30.0–36.0)
MCV: 89.5 fL (ref 80.0–100.0)
Monocytes Absolute: 0.4 10*3/uL (ref 0.1–1.0)
Monocytes Relative: 7 %
Neutro Abs: 3.1 10*3/uL (ref 1.7–7.7)
Neutrophils Relative %: 52 %
Platelet Count: 293 10*3/uL (ref 150–400)
RBC: 4.66 MIL/uL (ref 3.87–5.11)
RDW: 12.8 % (ref 11.5–15.5)
WBC Count: 6 10*3/uL (ref 4.0–10.5)
nRBC: 0 % (ref 0.0–0.2)

## 2022-02-18 LAB — CMP (CANCER CENTER ONLY)
ALT: 11 U/L (ref 0–44)
AST: 14 U/L — ABNORMAL LOW (ref 15–41)
Albumin: 4.2 g/dL (ref 3.5–5.0)
Alkaline Phosphatase: 67 U/L (ref 38–126)
Anion gap: 6 (ref 5–15)
BUN: 25 mg/dL — ABNORMAL HIGH (ref 6–20)
CO2: 30 mmol/L (ref 22–32)
Calcium: 9.9 mg/dL (ref 8.9–10.3)
Chloride: 104 mmol/L (ref 98–111)
Creatinine: 0.83 mg/dL (ref 0.44–1.00)
GFR, Estimated: >60 mL/min (ref >60)
Glucose, Bld: 109 mg/dL — ABNORMAL HIGH (ref 70–99)
Potassium: 4.1 mmol/L (ref 3.5–5.1)
Sodium: 140 mmol/L (ref 135–145)
Total Bilirubin: 0.3 mg/dL (ref 0.3–1.2)
Total Protein: 7.7 g/dL (ref 6.5–8.1)

## 2022-02-18 LAB — GENETIC SCREENING ORDER

## 2022-02-18 NOTE — Research (Signed)
MTG-015 - Tissue and Bodily Fluids: Translational Medicine: Discovery and Evaluation of Biomarkers/Pharmacogenomics for the Diagnosis and Personalized Management of Patients  ? ?02/18/22 ? ?Patient Yvette Franklin was identified by Dr. Lindi Adie as a potential candidate for the above listed study.  This Clinical Research Coordinator met with NORENA BRATTON, MRN [id number], on 02/18/22 in a manner and location that ensures patient privacy to discuss participation in the above listed research study.  Patient is Unaccompanied.   ? ?After introducing the study to the patient, she states she has decided she would rather not participate in this study.  She was thanked for her time and consideration. ? ?Clabe Seal ?Clinical Research Coordinator I  ?02/18/22 2:57 PM ?

## 2022-02-18 NOTE — Assessment & Plan Note (Signed)
02/09/2022:Screening mammogram detected left breast asymmetry and calcifications 1.5 cm stereotactic biopsy revealed high-grade DCIS ER 20% weak, PR 0% ?Right breast asymmetry: Ultrasound: Micro 6 6 mm: Biopsy pending ? ?Pathology review: I discussed with the patient the difference between DCIS and invasive breast cancer. It is considered a precancerous lesion. DCIS is classified as a 0. It is generally detected through mammograms as calcifications. We discussed the significance of grades and its impact on prognosis. We also discussed the importance of ER and PR receptors and their implications to adjuvant treatment options. Prognosis of DCIS dependence on grade, comedo necrosis. It is anticipated that if not treated, 20-30% of DCIS can develop into invasive breast cancer. ? ?Recommendation: ?1. Breast conserving surgery ?2. Followed by adjuvant radiation therapy ?3. Followed by antiestrogen therapy with tamoxifen ?5 years ? ?Tamoxifen counseling: We discussed the risks and benefits of tamoxifen. These include but not limited to insomnia, hot flashes, mood changes, vaginal dryness, and weight gain. Although rare, serious side effects including endometrial cancer, risk of blood clots were also discussed. We strongly believe that the benefits far outweigh the risks. Patient understands these risks and consented to starting treatment. Planned treatment duration is 5 years. ? ?Return to clinic after surgery to discuss the final pathology report and come up with an adjuvant treatment plan. ? ? ?

## 2022-02-18 NOTE — Telephone Encounter (Signed)
Unable to see patient during [facility] for genetics visit.  Called patient and scheduled video visit for Friday 4/7 at 1:30pm.  ?

## 2022-02-20 ENCOUNTER — Inpatient Hospital Stay (HOSPITAL_BASED_OUTPATIENT_CLINIC_OR_DEPARTMENT_OTHER): Payer: BC Managed Care – PPO | Admitting: Genetic Counselor

## 2022-02-20 ENCOUNTER — Encounter: Payer: Self-pay | Admitting: *Deleted

## 2022-02-20 ENCOUNTER — Encounter: Payer: Self-pay | Admitting: General Practice

## 2022-02-20 ENCOUNTER — Encounter: Payer: Self-pay | Admitting: Genetic Counselor

## 2022-02-20 DIAGNOSIS — D0512 Intraductal carcinoma in situ of left breast: Secondary | ICD-10-CM

## 2022-02-20 DIAGNOSIS — Z8041 Family history of malignant neoplasm of ovary: Secondary | ICD-10-CM

## 2022-02-20 HISTORY — DX: Family history of malignant neoplasm of ovary: Z80.41

## 2022-02-20 NOTE — Progress Notes (Signed)
[facility] Psychosocial Distress Screening ?Spiritual Care ? ?Left voicemail for [facility] following Breast Multidisciplinary Clinic to introduce [place] team/resources, reviewing distress screen per protocol.  The patient scored a  [unspecified]  on the Psychosocial Distress Thermometer which indicates  [unspecified]  distress.  ? 02/20/2022  ?ON[facility] DISTRESS SCREENING   ?Screening Type Initial Screening   ?Referral to support programs Yes   ? ? ?Follow up needed: Yes.  Will be out of office next week, but plan to phone thereafter for follow-up check-in and support as needed. ? ? ?Chaplain Lorrin Jackson, MDiv, [facility] ?Pager [phone] ?Voicemail [phone] ? ? ? ? ?  ?

## 2022-02-20 NOTE — Progress Notes (Signed)
REFERRING PROVIDER: ?Nicholas Lose, MD ?[address] ?[place],  [place] [address] ? ?PRIMARY PROVIDER:  ?Vevelyn Francois, NP ? ?PRIMARY REASON FOR VISIT:  ?Encounter Diagnoses  ?Name Primary?  ? Ductal carcinoma in situ (DCIS) of left breast Yes  ? Family history of GYN cancer   ? ?I connected with Yvette Franklin on 02/20/2022 at 1:30pm EDT by [place] video conference and verified that I am speaking with the correct person using two identifiers.  ? ?Patient location: Home ?Provider location: [place] ? ? ?HISTORY OF PRESENT ILLNESS:   ?Yvette Franklin, a 51 y.o. female, was seen for a [place] cancer genetics consultation at the request of Dr. Lindi Adie due to a personal history of ductal carcinoma in situ.  Yvette Franklin presents to clinic today to discuss the possibility of a hereditary predisposition to cancer, to discuss genetic testing, and to further clarify her future cancer risks, as well as potential cancer risks for family members.  ? 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 The treatment plan includes breast conserving surgery, adjuvant radiation, and anti-estrogens.  ? ?CANCER HISTORY:  ?Oncology History  ?Ductal carcinoma in situ (DCIS) of left breast  ?02/09/2022 Initial Diagnosis  ? Screening mammogram detected left breast asymmetry and calcifications 1.5 cm stereotactic biopsy revealed high-grade DCIS ER 20% weak, PR 0% ?Right breast asymmetry: Ultrasound: Micro 6 6 mm: Biopsy pending ?  ?02/18/2022 Cancer Staging  ? Staging form: Breast, AJCC 8th Edition ?- Clinical: Stage 0 (cTis (DCIS), cN0, cM0, G3, ER+, PR-, HER2: Not Assessed) - Signed by Nicholas Lose, MD on 02/18/2022 ?Stage prefix: Initial diagnosis ?Histologic grading system: 3 grade system ? ?  ? ? ?Past Medical History:  ?Diagnosis Date  ? Breast cancer ([place])   ? Hypertension   ? ? ?Past Surgical History:  ?Procedure Laterality Date  ? BREAST BIOPSY  Left 02/09/2022  ? BREAST BIOPSY Right 02/18/2022  ? CHOLECYSTECTOMY    ? ? ? ?FAMILY HISTORY:  ?We obtained a detailed, 4-generation family history.  Significant diagnoses are listed below: ?Family History  ?Problem Relation Age of Onset  ? Cancer Maternal Aunt   ?     GYN; ? ovarian  ? Cancer Cousin   ?     unknown type; dx 32s  ? ? ? ?Yvette Franklin is unaware of previous family history of genetic testing for hereditary cancer risks.  Yvette Franklin stated that her family primarily lives in [place] and has limited contact with some family members. Patient's maternal ancestors are of [place] descent, and paternal ancestors are of [place] descent. There is no reported Ashkenazi Jewish ancestry. There is no known consanguinity. ? ?GENETIC COUNSELING ASSESSMENT: Yvette Franklin is a 50 y.o. female with a personal history of cancer which is somewhat suggestive of a hereditary cancer syndrome and predisposition to cancer given her age of diagnosis. We, therefore, discussed and recommended the following at today's visit.  ? ?DISCUSSION: We discussed that 5 - 10% of cancer is hereditary, with most cases of hereditary breast cancer associated with mutations in BRCA1/2.  There are other genes that can be associated with hereditary breast cancer syndromes.  These include mutations in BRCA1/2.  We discussed that testing is beneficial for several reasons including knowing how to follow individuals after completing their treatment, identifying whether potential treatment options would be beneficial, and understanding if other family members could be  at risk for cancer and allowing them to undergo genetic testing.  ? ?We reviewed the characteristics, features and inheritance patterns of hereditary cancer syndromes. We also discussed genetic testing, including the appropriate family members to test, the process of testing, insurance coverage and turn-around-time for results. We discussed the implications of a negative, positive and/or variant of  uncertain significant result. In order to get genetic test results in a timely manner so that Yvette Franklin can use these genetic test results for surgical decisions, we recommended Yvette Franklin pursue genetic testing for the [employer] +RNAinsight. The BRCAplus panel offered by [employer] and includes sequencing and deletion/duplication analysis for the following 8 genes: ATM, BRCA1, BRCA2, CDH1, CHEK2, PALB2, PTEN, and TP53. Once complete, we recommend Yvette Franklin pursue reflex genetic testing to a more comprehensive gene panel.  ? ?The CustomNext-Cancer+RNAinsight panel offered by [facility] includes sequencing and rearrangement analysis for the following 47 genes:  APC, ATM, AXIN2, BARD1, BMPR1A, BRCA1, BRCA2, BRIP1, CDH1, CDK4, CDKN2A, CHEK2, DICER1, EPCAM, GREM1, HOXB13, MEN1, MLH1, MSH2, MSH3, MSH6, MUTYH, NBN, NF1, NF2, NTHL1, PALB2, PMS2, POLD1, POLE, PTEN, RAD51C, RAD51D, RECQL, RET, SDHA, SDHAF2, SDHB, SDHC, SDHD, SMAD4, SMARCA4, STK11, TP53, TSC1, TSC2, and VHL.  RNA data is routinely analyzed for use in variant interpretation for all genes. ? ?Based on Yvette Franklin's personal and family history of cancer, she meets medical criteria for genetic testing. Despite that she meets criteria, she may still have an out of pocket cost. We discussed that if her out of pocket cost for testing is over $100, the laboratory should contact them to discuss self-pay prices, patient pay assistance programs, if applicable, and other billing options.  ? ?PLAN: After considering the risks, benefits, and limitations, Yvette Franklin provided informed consent to pursue genetic testing and the blood sample was sent to [facility] for analysis of the [facility] and CustomNext-Cancer +RNAinsight Panel. Results should be available within approximately 1-2 weeks' time, at which point they will be disclosed by telephone to Yvette Franklin, as will any additional recommendations warranted by these results. Yvette Franklin will receive a  summary of her genetic counseling visit and a copy of her results once available. This information will also be available in Epic.  ? ?Lastly, we encouraged Yvette Franklin to remain in contact with cancer genetics annually so that we can continuously update the family history and inform her of any changes in cancer genetics and testing that may be of benefit for this family.  ? ?Yvette Franklin's questions were answered to her satisfaction today. Our contact information was provided should additional questions or concerns arise. Thank you for the referral and allowing [place] to share in the care of your patient.  ? ?Lizza Huffaker M. Joette Catching, [place], [place] ?Genetic Counselor ?[email or web address] ?(P) [phone] ? ?The patient was seen for a total of 20 minutes in audio and video genetic counseling.  Patient was accompanied by her son. Drs. Lindi Adie and/or Burr Medico were available to discuss this case as needed.  ?_______________________________________________________________________ ?For Office Staff:  ?Number of people involved in session: 2 ?Was an Intern/ student involved with case: no ? ?

## 2022-02-25 ENCOUNTER — Ambulatory Visit
Admission: RE | Admit: 2022-02-25 | Discharge: 2022-02-25 | Disposition: A | Discharge status: Home / Self Care | Payer: BC Managed Care – PPO | Source: Ambulatory Visit | Attending: Hematology and Oncology | Admitting: Hematology and Oncology

## 2022-02-25 ENCOUNTER — Telehealth: Payer: Self-pay | Admitting: *Deleted

## 2022-02-25 ENCOUNTER — Telehealth: Payer: Self-pay | Admitting: Hematology and Oncology

## 2022-02-25 ENCOUNTER — Encounter: Payer: Self-pay | Admitting: *Deleted

## 2022-02-25 DIAGNOSIS — D0512 Intraductal carcinoma in situ of left breast: Secondary | ICD-10-CM

## 2022-02-25 DIAGNOSIS — N6489 Other specified disorders of breast: Secondary | ICD-10-CM | POA: Diagnosis not present

## 2022-02-25 MED ORDER — GADOBUTROL 1 MMOL/ML IV SOLN
10.0000 mL | Freq: Once | INTRAVENOUS | Status: AC | PRN
  Administered 2022-02-25: 10 mL via INTRAVENOUS

## 2022-02-25 NOTE — Telephone Encounter (Signed)
Spoke to pt concerning [place] from 4.5.23. Denies questions or concerns regarding dx or treatment care plan. Confirmed MRI for this morning at 9:30 arrival.  ?Encourage pt to call with needs. Received verbal understanding. ?

## 2022-02-25 NOTE — Telephone Encounter (Signed)
Per 4/12 in basket. Called pt and left message about appointment.  Left call back number if changes are needed.  ?

## 2022-02-26 ENCOUNTER — Other Ambulatory Visit: Payer: Self-pay | Admitting: Hematology and Oncology

## 2022-02-26 ENCOUNTER — Encounter: Payer: Self-pay | Admitting: *Deleted

## 2022-02-26 ENCOUNTER — Other Ambulatory Visit: Payer: Self-pay | Admitting: Nurse Practitioner

## 2022-02-26 DIAGNOSIS — R9389 Abnormal findings on diagnostic imaging of other specified body structures: Secondary | ICD-10-CM

## 2022-02-26 DIAGNOSIS — D0512 Intraductal carcinoma in situ of left breast: Secondary | ICD-10-CM

## 2022-02-26 DIAGNOSIS — N6489 Other specified disorders of breast: Secondary | ICD-10-CM

## 2022-02-27 ENCOUNTER — Encounter: Payer: Self-pay | Admitting: Genetic Counselor

## 2022-02-27 ENCOUNTER — Telehealth: Payer: Self-pay | Admitting: Genetic Counselor

## 2022-02-27 ENCOUNTER — Ambulatory Visit: Payer: Self-pay | Admitting: Genetic Counselor

## 2022-02-27 DIAGNOSIS — Z1379 Encounter for other screening for genetic and chromosomal anomalies: Secondary | ICD-10-CM

## 2022-02-27 DIAGNOSIS — Z8041 Family history of malignant neoplasm of ovary: Secondary | ICD-10-CM

## 2022-02-27 DIAGNOSIS — D0512 Intraductal carcinoma in situ of left breast: Secondary | ICD-10-CM

## 2022-02-27 NOTE — Telephone Encounter (Signed)
Revealed negative genetic testing to patient and her son.  Discussed that we do not know why she has breast cancer or why there is cancer in the family. It could be sporadic/famillial, due to a different gene that we are not testing, or maybe our current technology may not be able to pick something up.  It will be important for her to keep in contact with genetics to keep up with whether additional testing may be needed.   ? ? ?

## 2022-02-27 NOTE — Progress Notes (Signed)
HPI:   ?Ms. Holian was previously seen in the [facility] clinic due to a personal history of cancer and concerns regarding a hereditary predisposition to cancer. Please refer to our prior cancer genetics clinic note for more information regarding our discussion, assessment and recommendations, at the time. Ms. Hentges recent genetic test results were disclosed to her, as were recommendations warranted by these results. These results and recommendations are discussed in more detail below. ? ?CANCER HISTORY:  ?Oncology History  ?Ductal carcinoma in situ (DCIS) of left breast  ?02/09/2022 Initial Diagnosis  ? Screening mammogram detected left breast asymmetry and calcifications 1.5 cm stereotactic biopsy revealed high-grade DCIS ER 20% weak, PR 0% ?Right breast asymmetry: Ultrasound: Micro 6 6 mm: Biopsy pending ?  ?02/18/2022 Cancer Staging  ? Staging form: Breast, AJCC 8th Edition ?- Clinical: Stage 0 (cTis (DCIS), cN0, cM0, G3, ER+, PR-, HER2: Not Assessed) - Signed by Nicholas Lose, MD on 02/18/2022 ?Stage prefix: Initial diagnosis ?Histologic grading system: 3 grade system ? ?  ?02/26/2022 Genetic Testing  ? gative hereditary cancer genetic testing: no pathogenic variants detected in [place] CustomNext-Cancer +RNAinsight Panel.  Report date is February 26, 2022.  ? ?The CustomNext-Cancer+RNAinsight panel offered by [facility] includes sequencing and rearrangement analysis for the following 47 genes:  APC, ATM, AXIN2, BARD1, BMPR1A, BRCA1, BRCA2, BRIP1, CDH1, CDK4, CDKN2A, CHEK2, DICER1, EPCAM, GREM1, HOXB13, MEN1, MLH1, MSH2, MSH3, MSH6, MUTYH, NBN, NF1, NF2, NTHL1, PALB2, PMS2, POLD1, POLE, PTEN, RAD51C, RAD51D, RECQL, RET, SDHA, SDHAF2, SDHB, SDHC, SDHD, SMAD4, SMARCA4, STK11, TP53, TSC1, TSC2, and VHL.  RNA data is routinely analyzed for use in variant interpretation for all genes. ? ?  ? ? ?FAMILY HISTORY:  ?We obtained a detailed, 4-generation family history.  Significant diagnoses are listed  below: ?Family History  ?Problem Relation Age of Onset  ? Healthy Mother   ? Healthy Father   ? Cancer Maternal Aunt   ?     GYN; ? ovarian  ? Cancer Cousin   ?     unknown type; dx 109s  ? Breast cancer Neg Hx   ? ? ?  ?Ms. Lambson is unaware of previous family history of genetic testing for hereditary cancer risks.  Ms. Ruffalo stated that her family primarily lives in [place] and has limited contact with some family members. Patient's maternal ancestors are of [place] descent, and paternal ancestors are of [place] descent. There is no reported Ashkenazi Jewish ancestry. There is no known consanguinity. ?  ? ?GENETIC TEST RESULTS:  ?The [place] CustomNext-Cancer +RNAinsight Panel found no pathogenic mutations. The CustomNext-Cancer+RNAinsight panel offered by [facility] includes sequencing and rearrangement analysis for the following 47 genes:  APC, ATM, AXIN2, BARD1, BMPR1A, BRCA1, BRCA2, BRIP1, CDH1, CDK4, CDKN2A, CHEK2, DICER1, EPCAM, GREM1, HOXB13, MEN1, MLH1, MSH2, MSH3, MSH6, MUTYH, NBN, NF1, NF2, NTHL1, PALB2, PMS2, POLD1, POLE, PTEN, RAD51C, RAD51D, RECQL, RET, SDHA, SDHAF2, SDHB, SDHC, SDHD, SMAD4, SMARCA4, STK11, TP53, TSC1, TSC2, and VHL.  RNA data is routinely analyzed for use in variant interpretation for all genes. ? ? ?The test report has been scanned into EPIC and is located under the Molecular Pathology section of the Results Review tab.  A portion of the result report is included below for reference. Genetic testing reported out on February 26, 2022.  ? ? ? ? ?Even though a pathogenic variant was not identified, possible explanations for the cancer in the family may include: ?There may be no hereditary risk for cancer in the family.  The cancers in Ms. Taketa and/or her family may be sporadic/familial or due to other genetic and environmental factors. ?There may be a gene mutation in one of these genes that current testing methods cannot detect but that chance is small. ?There could be another gene that has  not yet been discovered, or that we have not yet tested, that is responsible for the cancer diagnoses in the family.  ? ?Therefore, it is important to remain in touch with cancer genetics in the future so that we can continue to offer Ms. Erb the most up to date genetic testing.  ? ? ? ?ADDITIONAL GENETIC TESTING:  ?We discussed with Ms. Gentles that her genetic testing was fairly extensive.  If there are genes identified to increase cancer risk that can be analyzed in the future, we would be happy to discuss and coordinate this testing at that time.   ? ?CANCER SCREENING RECOMMENDATIONS:  ?Ms. Urbanek's test result is considered negative (normal).  This means that we have not identified a hereditary cause for her personal history of DCIS at this time.  ? ?An individual's cancer risk and medical management are not determined by genetic test results alone. Overall cancer risk assessment incorporates additional factors, including personal medical history, family history, and any available genetic information that may result in a personalized plan for cancer prevention and surveillance. Therefore, it is recommended she continue to follow the cancer management and screening guidelines provided by her oncology and primary healthcare provider. ? ?RECOMMENDATIONS FOR FAMILY MEMBERS:   ?Since she did not inherit a identifiable mutation in a cancer predisposition gene included on this panel, her children could not have inherited a known mutation from her in one of these genes. ?Individuals in this family might be at some increased risk of developing cancer, over the general population risk, due to the family history of cancer.  Individuals in the family should notify their providers of the family history of cancer. We recommend women in this family have a yearly mammogram beginning at age 21, or 21 years younger than the earliest onset of cancer, an annual clinical breast exam, and perform monthly breast self-exams.   ? ? ?FOLLOW-UP:  ?Lastly, we discussed with Ms. Qin that cancer genetics is a rapidly advancing field and it is possible that new genetic tests will be appropriate for her and/or her family members in the future. We encouraged her to remain in contact with cancer genetics on an annual basis so we can update her personal and family histories and let her know of advances in cancer genetics that may benefit this family.  ? ?Our contact number was provided. Ms. Hieronymus questions were answered to her satisfaction, and she knows she is welcome to call us at anytime with additional questions or concerns.  ? ?Margarett Viti M. Joette Catching, [place], [place] ?Genetic Counselor ?[email or web address] ?[phone]) [phone] ? ?

## 2022-03-02 ENCOUNTER — Encounter: Payer: Self-pay | Admitting: General Practice

## 2022-03-02 ENCOUNTER — Other Ambulatory Visit (HOSPITAL_COMMUNITY): Payer: Self-pay

## 2022-03-02 ENCOUNTER — Encounter: Payer: BC Managed Care – PPO | Admitting: Licensed Clinical Social Worker

## 2022-03-02 ENCOUNTER — Other Ambulatory Visit: Payer: BC Managed Care – PPO

## 2022-03-02 NOTE — Progress Notes (Signed)
[place] Spiritual Care Note ? ?Reached Ms Dougherty by phone for [facility] follow-up check-in. She reports good support from friends and no concerns at this time, and she is aware of ongoing Spiritual Care availability, should needs arise or circumstances change. ? ? ?Chaplain Lorrin Jackson, MDiv, [facility] ?Pager [phone] ?Voicemail [phone]  ?

## 2022-03-05 ENCOUNTER — Other Ambulatory Visit (HOSPITAL_COMMUNITY): Payer: Self-pay

## 2022-03-12 DIAGNOSIS — H16223 Keratoconjunctivitis sicca, not specified as Sjogren's, bilateral: Secondary | ICD-10-CM | POA: Diagnosis not present

## 2022-03-12 DIAGNOSIS — H20012 Primary iridocyclitis, left eye: Secondary | ICD-10-CM | POA: Diagnosis not present

## 2022-03-18 ENCOUNTER — Encounter: Payer: Self-pay | Admitting: Hematology and Oncology

## 2022-03-18 ENCOUNTER — Ambulatory Visit
Admission: RE | Admit: 2022-03-18 | Discharge: 2022-03-18 | Disposition: A | Discharge status: Home / Self Care | Payer: BC Managed Care – PPO | Source: Ambulatory Visit | Attending: Hematology and Oncology | Admitting: Hematology and Oncology

## 2022-03-18 ENCOUNTER — Other Ambulatory Visit (HOSPITAL_COMMUNITY): Payer: Self-pay | Admitting: Diagnostic Radiology

## 2022-03-18 DIAGNOSIS — R928 Other abnormal and inconclusive findings on diagnostic imaging of breast: Secondary | ICD-10-CM | POA: Diagnosis not present

## 2022-03-18 DIAGNOSIS — D0512 Intraductal carcinoma in situ of left breast: Secondary | ICD-10-CM

## 2022-03-18 DIAGNOSIS — R9389 Abnormal findings on diagnostic imaging of other specified body structures: Secondary | ICD-10-CM

## 2022-03-18 MED ORDER — GADOBUTROL 1 MMOL/ML IV SOLN
8.0000 mL | Freq: Once | INTRAVENOUS | Status: AC | PRN
  Administered 2022-03-18: 8 mL via INTRAVENOUS

## 2022-03-19 ENCOUNTER — Encounter: Payer: Self-pay | Admitting: *Deleted

## 2022-03-24 ENCOUNTER — Ambulatory Visit (INDEPENDENT_AMBULATORY_CARE_PROVIDER_SITE_OTHER): Payer: BC Managed Care – PPO | Admitting: Plastic Surgery

## 2022-03-24 ENCOUNTER — Encounter: Payer: Self-pay | Admitting: Plastic Surgery

## 2022-03-24 VITALS — BP 104/70 | HR 78 | Ht 64.0 in | Wt 195.2 lb

## 2022-03-24 DIAGNOSIS — D0512 Intraductal carcinoma in situ of left breast: Secondary | ICD-10-CM

## 2022-03-24 NOTE — Progress Notes (Signed)
? ?  Patient ID: Yvette Franklin, female    DOB: Jun 07, 1971, 51 y.o.   MRN: [id number] ? ? ?Chief Complaint  ?Patient presents with  ? Breast Cancer  ? ? ?The patient is a 51 year old female here for a consultation for breast reconstruction.  She was diagnosed with left breast DCIS by a screening mammogram.  She had some asymmetry and a diagnostic mammogram was then done followed by biopsy which showed a lesion in the upper outer quadrant of the left breast.  The biopsy showed high-grade DCIS which was estrogen positive and progesterone negative. She is 5 feet 4 inches and 195 pounds.  She is not a smoker and does not have DM.  She had a cholecystectomy in the past.  Her preop bra size is a C with ptosis.  She wants reconstruction. ? ? ?Review of Systems  ?Constitutional: Negative.   ?Eyes: Negative.   ?Respiratory: Negative.    ?Cardiovascular: Negative.   ?Gastrointestinal: Negative.   ?Endocrine: Negative.   ?Genitourinary: Negative.   ?Musculoskeletal: Negative.   ?Skin:  Positive for color change.  ?Hematological: Negative.   ?Psychiatric/Behavioral: Negative.    ? ?Past Medical History:  ?Diagnosis Date  ? Breast cancer ([place])   ? Family history of GYN cancer 02/20/2022  ? Hypertension   ?  ?Past Surgical History:  ?Procedure Laterality Date  ? BREAST BIOPSY Left 02/09/2022  ? BREAST BIOPSY Right 02/18/2022  ? CHOLECYSTECTOMY    ?  ? ? ?Current Outpatient Medications:  ?  BIOTIN-CHOLINE-SILICON PO, Take by mouth., Disp: , Rfl:  ?  losartan-hydrochlorothiazide (HYZAAR) 100-12.5 MG tablet, Take 1 tablet by mouth daily., Disp: 90 tablet, Rfl: 3 ?  metroNIDAZOLE (METROCREAM) 0.75 % cream, Apply topically 2 (two) times daily., Disp: 45 g, Rfl: 2 ?  Omega-3 Fatty Acids (FISH OIL) 1000 MG CAPS, Take by mouth., Disp: , Rfl:  ?  XIIDRA 5 % SOLN, SMARTSIG:1 Drop(s) In Eye(s), Disp: , Rfl:  ?  Zinc 50 MG TABS, Take by mouth., Disp: , Rfl:   ? ?Objective:  ? ?Vitals:  ? 03/24/22 1427  ?BP: 104/70  ?Pulse: 78  ?SpO2: 98%   ? ? ?Physical Exam ?Vitals reviewed.  ?Constitutional:   ?   Appearance: Normal appearance.  ?HENT:  ?   Head: Normocephalic and atraumatic.  ?Cardiovascular:  ?   Rate and Rhythm: Normal rate.  ?   Pulses: Normal pulses.  ?Pulmonary:  ?   Effort: Pulmonary effort is normal.  ?Abdominal:  ?   General: There is no distension.  ?   Palpations: Abdomen is soft.  ?   Tenderness: There is no abdominal tenderness.  ?Musculoskeletal:     ?   General: No swelling or deformity.  ?Skin: ?   General: Skin is warm.  ?   Capillary Refill: Capillary refill takes less than 2 seconds.  ?   Coloration: Skin is not jaundiced.  ?   Findings: No bruising.  ?Neurological:  ?   Mental Status: She is alert and oriented to person, place, and time.  ?Psychiatric:     ?   Mood and Affect: Mood normal.     ?   Behavior: Behavior normal.     ?   Thought Content: Thought content normal.     ?   Judgment: Judgment normal.  ? ? ?Assessment & Plan:  ?Ductal carcinoma in situ (DCIS) of left breast ? ?The options for reconstruction we explained to the patient / family for breast  reconstruction.  There are two general categories of reconstruction.  We can reconstruction a breast with implants or use the patient's own tissue.  These were further discussed as listed. ? ?Breast reconstruction is an optional procedure and eligibility depends on the full spectrum of the health of the patient and any co-morbidities.  More than one surgery is often needed to complete the reconstruction process.  The process can take three to twelve months to complete.  The breasts will not be identical due to many factors such as rib differences, shoulder asymmetry and treatments such as radiation.  The goal is to get the breasts to look normal and symmetrical in clothes.  Scars are a part of surgery and may fade some in time but will always be present under clothes.  Surgery may be an option on the non-cancer breast to achieve more symmetry.  No matter which procedure is  chosen there is always the risk of complications and even failure of the body to heal.  This could result in no breast.   ? ?The options for reconstruction include:  ?1. Placement of a tissue expander with Acellular dermal matrix. When the expander is the desired size surgery is performed to remove the expander and place an implant.  In some cases the implant can be placed without an expander.  ?2. Autologous reconstruction can include using a muscle or tissue from another area of the body to create a breast.  ?3. Combined procedures (ie. latissismus dorsi flap) can be done with an expander / implant placed under the muscle. ?  ?The risks, benefits, scars and recovery time were discussed for each of the above. Risks include bleeding, infection, hematoma, seroma, scarring, pain, wound healing complications, flap loss, fat necrosis, capsular contracture, need for implant removal, donor site complications, bulge, hernia, umbilical necrosis, need for urgent reoperation, and need for dressing changes.  ? ?The procedure the patient selected / that was best for the patient, was then discussed in further detail.  Total time: 45 minutes. This includes time spent with the patient during the visit as well as time spent before and after the visit reviewing the chart, documenting the encounter, making phone calls and reviewing studies.  ? ?Patient thought about autologous reconstruction but is not keen with the OR or hospitalization time.  I spoke with Dr. Marlou Starks about the plan.  Likely not keep the NAC due to the ptosis. ? ?Plan for immediate left breast reconstruction with Expander and Flex HD. ? ?Pictures were obtained of the patient and placed in the chart with the patient's or guardian's permission. ? ? ?Loel Lofty Dajaun Goldring, DO ?

## 2022-03-25 ENCOUNTER — Ambulatory Visit: Payer: Self-pay | Admitting: General Surgery

## 2022-03-26 ENCOUNTER — Encounter: Payer: Self-pay | Admitting: *Deleted

## 2022-03-30 ENCOUNTER — Telehealth: Payer: Self-pay | Admitting: Hematology and Oncology

## 2022-03-30 ENCOUNTER — Other Ambulatory Visit (HOSPITAL_COMMUNITY): Payer: Self-pay

## 2022-03-30 NOTE — Telephone Encounter (Signed)
Scheduled per 5/11 in basket, pt has been called and confirmed appt ?

## 2022-03-31 ENCOUNTER — Institutional Professional Consult (permissible substitution): Payer: BC Managed Care – PPO | Admitting: Plastic Surgery

## 2022-04-02 ENCOUNTER — Encounter: Payer: Self-pay | Admitting: *Deleted

## 2022-04-02 NOTE — Progress Notes (Signed)
Patient ID: Yvette Franklin, female    DOB: [birth year], 51 y.o.   MRN: [id number]  Chief Complaint  Patient presents with   Pre-op Exam      ICD-10-CM   1. Ductal carcinoma in situ (DCIS) of left breast  D05.12        History of Present Illness: Yvette Franklin is a 51 y.o.  female  with a history of left-sided DCIS.  She presents for preoperative evaluation for upcoming procedure, left-sided mastectomy with immediate reconstruction using tissue expander Flex HD, scheduled for 04/22/2022 with Dr. Marla Roe.  The patient has not had problems with anesthesia.  She had previous cholecystectomy without complication.  She is accompanied by her husband at bedside.  She denies any personal or family history of blood clots or clotting disorder.  No personal history of cardiac disease aside from high blood pressure well controlled with losartan.  No history of MI, CVA, or use of blood thinners.  She plans to hold her vitamins and supplements 5 days prior to surgery.  She confirms that she is a C cup and would like to be approximately the same size after reconstruction.  Summary of Previous Visit: Patient was seen for consult 03/24/2022 by Dr. Marla Roe.  She has biopsy-proven DCIS left breast.  Preoperative bra size equals C cup.  Plan is for left-sided mastectomy with lymph node biopsy with Dr. Marlou Starks.  Discussed options for reconstruction and she would like to proceed with implant-based reconstruction using tissue expander and Flex HD.  Job: Animal nutritionist, and describes desk position.  Discussed 3 weeks FMLA and STD.  PMH Significant for: Left-sided DCIS, HTN, cholecystectomy, cataract surgery.   Past Medical History: Allergies: No Known Allergies  Current Medications:  Current Outpatient Medications:    BIOTIN-CHOLINE-SILICON PO, Take by mouth., Disp: , Rfl:    losartan-hydrochlorothiazide (HYZAAR) 100-12.5 MG tablet, Take 1 tablet by mouth daily., Disp: 90 tablet, Rfl: 3   metroNIDAZOLE  (METROCREAM) 0.75 % cream, Apply topically 2 (two) times daily., Disp: 45 g, Rfl: 2   Omega-3 Fatty Acids (FISH OIL) 1000 MG CAPS, Take by mouth., Disp: , Rfl:    XIIDRA 5 % SOLN, SMARTSIG:1 Drop(s) In Eye(s), Disp: , Rfl:    Zinc 50 MG TABS, Take by mouth., Disp: , Rfl:   Past Medical Problems: Past Medical History:  Diagnosis Date   Breast cancer ([place])    Family history of GYN cancer 02/20/2022   Hypertension     Past Surgical History: Past Surgical History:  Procedure Laterality Date   BREAST BIOPSY Left 02/09/2022   BREAST BIOPSY Right 02/18/2022   CHOLECYSTECTOMY      Social History: Social History   Socioeconomic History   Marital status: Married    Spouse name: Not on file   Number of children: Not on file   Years of education: Not on file   Highest education level: Not on file  Occupational History   Not on file  Tobacco Use   Smoking status: Never   Smokeless tobacco: Never  Vaping Use   Vaping Use: Never used  Substance and Sexual Activity   Alcohol use: No   Drug use: No   Sexual activity: Not on file  Other Topics Concern   Not on file  Social History Narrative   Not on file   Social Determinants of Health   Financial Resource Strain: Not on file  Food Insecurity: Not on file  Transportation Needs: Not on file  Physical Activity: Not on file  Stress: Not on file  Social Connections: Not on file  Intimate Partner Violence: Not on file    Family History: Family History  Problem Relation Age of Onset   Healthy Mother    Healthy Father    Cancer Maternal Aunt        GYN; ? ovarian   Cancer Cousin        unknown type; dx 12s   Breast cancer Neg Hx     Review of Systems: ROS Denies recent chest pain, difficulty breathing, fevers, or leg swelling.  Physical Exam: Vital Signs There were no vitals taken for this visit.  Physical Exam Constitutional:      General: Not in acute distress.    Appearance: Normal appearance. Not  ill-appearing.  HENT:     Head: Normocephalic and atraumatic.  Eyes:     Pupils: Pupils are equal, round. Cardiovascular:     Rate and Rhythm: Normal rate.    Pulses: Normal pulses.  Pulmonary:     Effort: No respiratory distress or increased work of breathing.  Speaks in full sentences. Abdominal:     General: Abdomen is flat. No distension.   Musculoskeletal: Normal range of motion. No lower extremity swelling or edema. No varicosities. Skin:    General: Skin is warm and dry.     Findings: No erythema or rash.  Neurological:     Mental Status: Alert and oriented to person, place, and time.  Psychiatric:        Mood and Affect: Mood normal.        Behavior: Behavior normal.    Assessment/Plan: The patient is scheduled for left-sided mastectomy with immediate reconstruction using tissue expander and Flex HD with Dr. Marla Roe.  Risks, benefits, and alternatives of procedure discussed, questions answered and consent obtained.    Smoking Status: Non-smoker. Last Mammogram: 01/2022; Results: No evidence of right breast malignancy, biopsy-proven DCIS left breast  Caprini Score: 6; Risk Factors include: Age, BMI greater than 25, breast cancer, and length of planned surgery. Recommendation for mechanical prophylaxis. Encourage early ambulation.   Pictures obtained: 03/24/2022  Post-op Rx sent to pharmacy: Norco, Zofran, Keflex, Valium.  Patient was provided with the General Surgical Risk consent document and Pain Medication Agreement prior to their appointment.  They had adequate time to read through the risk consent documents and Pain Medication Agreement. We also discussed them in person together during this preop appointment. All of their questions were answered to their satisfaction.  Recommended calling if they have any further questions.  Risk consent form and Pain Medication Agreement to be scanned into patient's chart.  The risks that can be encountered with and after placement  of a breast expander placement were discussed and include the following but not limited to these: bleeding, infection, delayed healing, anesthesia risks, skin sensation changes, injury to structures including nerves, blood vessels, and muscles which may be temporary or permanent, allergies to tape, suture materials and glues, blood products, topical preparations or injected agents, skin contour irregularities, skin discoloration and swelling, deep vein thrombosis, cardiac and pulmonary complications, pain, which may persist, fluid accumulation, wrinkling of the skin over the expander, changes in nipple or breast sensation, expander leakage or rupture, faulty position of the expander, persistent pain, formation of tight scar tissue around the expander (capsular contracture), possible need for revisional surgery or staged procedures.  Patient understands the need for postoperative drains.  Electronically signed by: Krista Blue, PA-C 04/06/2022 8:05 AM

## 2022-04-02 NOTE — H&P (View-Only) (Signed)
Patient ID: Yvette Franklin, female    DOB: October 12, 1971, 51 y.o.   MRN: [id number]  Chief Complaint  Patient presents with   Pre-op Exam      ICD-10-CM   1. Ductal carcinoma in situ (DCIS) of left breast  D05.12        History of Present Illness: Yvette Franklin is a 51 y.o.  female  with a history of left-sided DCIS.  She presents for preoperative evaluation for upcoming procedure, left-sided mastectomy with immediate reconstruction using tissue expander Flex HD, scheduled for 04/22/2022 with Dr. Marla Roe.  The patient has not had problems with anesthesia.  She had previous cholecystectomy without complication.  She is accompanied by her husband at bedside.  She denies any personal or family history of blood clots or clotting disorder.  No personal history of cardiac disease aside from high blood pressure well controlled with losartan.  No history of MI, CVA, or use of blood thinners.  She plans to hold her vitamins and supplements 5 days prior to surgery.  She confirms that she is a C cup and would like to be approximately the same size after reconstruction.  Summary of Previous Visit: Patient was seen for consult 03/24/2022 by Dr. Marla Roe.  She has biopsy-proven DCIS left breast.  Preoperative bra size equals C cup.  Plan is for left-sided mastectomy with lymph node biopsy with Dr. Marlou Starks.  Discussed options for reconstruction and she would like to proceed with implant-based reconstruction using tissue expander and Flex HD.  Job: Animal nutritionist, and describes desk position.  Discussed 3 weeks FMLA and STD.  PMH Significant for: Left-sided DCIS, HTN, cholecystectomy, cataract surgery.   Past Medical History: Allergies: No Known Allergies  Current Medications:  Current Outpatient Medications:    BIOTIN-CHOLINE-SILICON PO, Take by mouth., Disp: , Rfl:    losartan-hydrochlorothiazide (HYZAAR) 100-12.5 MG tablet, Take 1 tablet by mouth daily., Disp: 90 tablet, Rfl: 3   metroNIDAZOLE  (METROCREAM) 0.75 % cream, Apply topically 2 (two) times daily., Disp: 45 g, Rfl: 2   Omega-3 Fatty Acids (FISH OIL) 1000 MG CAPS, Take by mouth., Disp: , Rfl:    XIIDRA 5 % SOLN, SMARTSIG:1 Drop(s) In Eye(s), Disp: , Rfl:    Zinc 50 MG TABS, Take by mouth., Disp: , Rfl:   Past Medical Problems: Past Medical History:  Diagnosis Date   Breast cancer ([place])    Family history of GYN cancer 02/20/2022   Hypertension     Past Surgical History: Past Surgical History:  Procedure Laterality Date   BREAST BIOPSY Left 02/09/2022   BREAST BIOPSY Right 02/18/2022   CHOLECYSTECTOMY      Social History: Social History   Socioeconomic History   Marital status: Married    Spouse name: Not on file   Number of children: Not on file   Years of education: Not on file   Highest education level: Not on file  Occupational History   Not on file  Tobacco Use   Smoking status: Never   Smokeless tobacco: Never  Vaping Use   Vaping Use: Never used  Substance and Sexual Activity   Alcohol use: No   Drug use: No   Sexual activity: Not on file  Other Topics Concern   Not on file  Social History Narrative   Not on file   Social Determinants of Health   Financial Resource Strain: Not on file  Food Insecurity: Not on file  Transportation Needs: Not on file  Physical Activity: Not on file  Stress: Not on file  Social Connections: Not on file  Intimate Partner Violence: Not on file    Family History: Family History  Problem Relation Age of Onset   Healthy Mother    Healthy Father    Cancer Maternal Aunt        GYN; ? ovarian   Cancer Cousin        unknown type; dx 49s   Breast cancer Neg Hx     Review of Systems: ROS Denies recent chest pain, difficulty breathing, fevers, or leg swelling.  Physical Exam: Vital Signs There were no vitals taken for this visit.  Physical Exam Constitutional:      General: Not in acute distress.    Appearance: Normal appearance. Not  ill-appearing.  HENT:     Head: Normocephalic and atraumatic.  Eyes:     Pupils: Pupils are equal, round. Cardiovascular:     Rate and Rhythm: Normal rate.    Pulses: Normal pulses.  Pulmonary:     Effort: No respiratory distress or increased work of breathing.  Speaks in full sentences. Abdominal:     General: Abdomen is flat. No distension.   Musculoskeletal: Normal range of motion. No lower extremity swelling or edema. No varicosities. Skin:    General: Skin is warm and dry.     Findings: No erythema or rash.  Neurological:     Mental Status: Alert and oriented to person, place, and time.  Psychiatric:        Mood and Affect: Mood normal.        Behavior: Behavior normal.    Assessment/Plan: The patient is scheduled for left-sided mastectomy with immediate reconstruction using tissue expander and Flex HD with Dr. Marla Roe.  Risks, benefits, and alternatives of procedure discussed, questions answered and consent obtained.    Smoking Status: Non-smoker. Last Mammogram: 01/2022; Results: No evidence of right breast malignancy, biopsy-proven DCIS left breast  Caprini Score: 6; Risk Factors include: Age, BMI greater than 25, breast cancer, and length of planned surgery. Recommendation for mechanical prophylaxis. Encourage early ambulation.   Pictures obtained: 03/24/2022  Post-op Rx sent to pharmacy: Norco, Zofran, Keflex, Valium.  Patient was provided with the General Surgical Risk consent document and Pain Medication Agreement prior to their appointment.  They had adequate time to read through the risk consent documents and Pain Medication Agreement. We also discussed them in person together during this preop appointment. All of their questions were answered to their satisfaction.  Recommended calling if they have any further questions.  Risk consent form and Pain Medication Agreement to be scanned into patient's chart.  The risks that can be encountered with and after placement  of a breast expander placement were discussed and include the following but not limited to these: bleeding, infection, delayed healing, anesthesia risks, skin sensation changes, injury to structures including nerves, blood vessels, and muscles which may be temporary or permanent, allergies to tape, suture materials and glues, blood products, topical preparations or injected agents, skin contour irregularities, skin discoloration and swelling, deep vein thrombosis, cardiac and pulmonary complications, pain, which may persist, fluid accumulation, wrinkling of the skin over the expander, changes in nipple or breast sensation, expander leakage or rupture, faulty position of the expander, persistent pain, formation of tight scar tissue around the expander (capsular contracture), possible need for revisional surgery or staged procedures.  Patient understands the need for postoperative drains.  Electronically signed by: Krista Blue, PA-C 04/06/2022 8:05 AM

## 2022-04-06 ENCOUNTER — Ambulatory Visit (INDEPENDENT_AMBULATORY_CARE_PROVIDER_SITE_OTHER): Payer: BC Managed Care – PPO | Admitting: Physician Assistant

## 2022-04-06 VITALS — BP 120/70 | HR 76 | Ht 64.0 in | Wt 195.0 lb

## 2022-04-06 DIAGNOSIS — D0512 Intraductal carcinoma in situ of left breast: Secondary | ICD-10-CM

## 2022-04-06 MED ORDER — DIAZEPAM 2 MG PO TABS: 2.0000 mg | ORAL_TABLET | Freq: Two times a day (BID) | ORAL | 0 refills | Status: DC | PRN

## 2022-04-06 MED ORDER — ONDANSETRON 4 MG PO TBDP: 4.0000 mg | ORAL_TABLET | Freq: Three times a day (TID) | ORAL | 0 refills | Status: DC | PRN

## 2022-04-06 MED ORDER — CEPHALEXIN 500 MG PO CAPS: 500.0000 mg | ORAL_CAPSULE | Freq: Three times a day (TID) | ORAL | 0 refills | Status: AC

## 2022-04-06 MED ORDER — HYDROCODONE-ACETAMINOPHEN 5-325 MG PO TABS: 1.0000 | ORAL_TABLET | Freq: Four times a day (QID) | ORAL | 0 refills | Status: DC | PRN

## 2022-04-07 MED ORDER — OXYCODONE HCL 5 MG PO TABS: 5.0000 mg | ORAL_TABLET | Freq: Four times a day (QID) | ORAL | 0 refills | Status: AC | PRN

## 2022-04-07 NOTE — Addendum Note (Signed)
Addended by: Krista Blue on: 04/07/2022 10:31 AM   Modules accepted: Orders

## 2022-04-14 ENCOUNTER — Other Ambulatory Visit: Payer: Self-pay

## 2022-04-14 ENCOUNTER — Encounter (HOSPITAL_BASED_OUTPATIENT_CLINIC_OR_DEPARTMENT_OTHER): Payer: Self-pay | Admitting: General Surgery

## 2022-04-16 DIAGNOSIS — C50912 Malignant neoplasm of unspecified site of left female breast: Secondary | ICD-10-CM | POA: Diagnosis not present

## 2022-04-17 ENCOUNTER — Encounter (HOSPITAL_BASED_OUTPATIENT_CLINIC_OR_DEPARTMENT_OTHER)
Admission: RE | Admit: 2022-04-17 | Discharge: 2022-04-17 | Disposition: A | Discharge status: Home / Self Care | Payer: BC Managed Care – PPO | Source: Ambulatory Visit | Attending: General Surgery | Admitting: General Surgery

## 2022-04-17 DIAGNOSIS — Z01818 Encounter for other preprocedural examination: Secondary | ICD-10-CM | POA: Diagnosis not present

## 2022-04-17 DIAGNOSIS — I10 Essential (primary) hypertension: Secondary | ICD-10-CM | POA: Diagnosis not present

## 2022-04-17 LAB — BASIC METABOLIC PANEL
Anion gap: 6 (ref 5–15)
BUN: 10 mg/dL (ref 6–20)
CO2: 28 mmol/L (ref 22–32)
Calcium: 9.3 mg/dL (ref 8.9–10.3)
Chloride: 108 mmol/L (ref 98–111)
Creatinine, Ser: 0.77 mg/dL (ref 0.44–1.00)
GFR, Estimated: >60 mL/min (ref >60)
Glucose, Bld: 105 mg/dL — ABNORMAL HIGH (ref 70–99)
Potassium: 3.8 mmol/L (ref 3.5–5.1)
Sodium: 142 mmol/L (ref 135–145)

## 2022-04-17 NOTE — Progress Notes (Signed)

## 2022-04-22 ENCOUNTER — Encounter (HOSPITAL_BASED_OUTPATIENT_CLINIC_OR_DEPARTMENT_OTHER): Payer: Self-pay | Admitting: General Surgery

## 2022-04-22 ENCOUNTER — Other Ambulatory Visit: Payer: Self-pay

## 2022-04-22 ENCOUNTER — Ambulatory Visit (HOSPITAL_BASED_OUTPATIENT_CLINIC_OR_DEPARTMENT_OTHER): Payer: BC Managed Care – PPO | Admitting: Certified Registered"

## 2022-04-22 ENCOUNTER — Encounter (HOSPITAL_BASED_OUTPATIENT_CLINIC_OR_DEPARTMENT_OTHER)
Admission: RE | Disposition: A | Discharge status: Home / Self Care | Payer: Self-pay | Source: Home / Self Care | Attending: Plastic Surgery

## 2022-04-22 ENCOUNTER — Observation Stay (HOSPITAL_BASED_OUTPATIENT_CLINIC_OR_DEPARTMENT_OTHER)
Admission: RE | Admit: 2022-04-22 | Discharge: 2022-04-23 | Disposition: A | Discharge status: Home / Self Care | Payer: BC Managed Care – PPO | Attending: Plastic Surgery | Admitting: Plastic Surgery

## 2022-04-22 DIAGNOSIS — C50412 Malignant neoplasm of upper-outer quadrant of left female breast: Secondary | ICD-10-CM

## 2022-04-22 DIAGNOSIS — C50919 Malignant neoplasm of unspecified site of unspecified female breast: Secondary | ICD-10-CM | POA: Diagnosis present

## 2022-04-22 DIAGNOSIS — Z170 Estrogen receptor positive status [ER+]: Secondary | ICD-10-CM

## 2022-04-22 DIAGNOSIS — Z79899 Other long term (current) drug therapy: Secondary | ICD-10-CM | POA: Diagnosis not present

## 2022-04-22 DIAGNOSIS — G8918 Other acute postprocedural pain: Secondary | ICD-10-CM | POA: Diagnosis not present

## 2022-04-22 DIAGNOSIS — I10 Essential (primary) hypertension: Secondary | ICD-10-CM | POA: Insufficient documentation

## 2022-04-22 DIAGNOSIS — D0512 Intraductal carcinoma in situ of left breast: Principal | ICD-10-CM | POA: Insufficient documentation

## 2022-04-22 DIAGNOSIS — N641 Fat necrosis of breast: Secondary | ICD-10-CM | POA: Insufficient documentation

## 2022-04-22 DIAGNOSIS — N6012 Diffuse cystic mastopathy of left breast: Secondary | ICD-10-CM | POA: Diagnosis not present

## 2022-04-22 HISTORY — DX: Other complications of anesthesia, initial encounter: T88.59XA

## 2022-04-22 HISTORY — PX: BREAST RECONSTRUCTION WITH PLACEMENT OF TISSUE EXPANDER AND ALLODERM: SHX6805

## 2022-04-22 HISTORY — DX: Nausea with vomiting, unspecified: R11.2

## 2022-04-22 HISTORY — PX: MASTECTOMY W/ SENTINEL NODE BIOPSY: SHX2001

## 2022-04-22 HISTORY — DX: Other specified postprocedural states: Z98.890

## 2022-04-22 SURGERY — MASTECTOMY WITH SENTINEL LYMPH NODE BIOPSY
Anesthesia: General | Site: Breast | Laterality: Left

## 2022-04-22 MED ORDER — FENTANYL CITRATE (PF) 100 MCG/2ML IJ SOLN
50.0000 ug | Freq: Once | INTRAMUSCULAR | Status: AC
  Administered 2022-04-22: 50 ug via INTRAVENOUS

## 2022-04-22 MED ORDER — PHENYLEPHRINE HCL (PRESSORS) 10 MG/ML IV SOLN
INTRAVENOUS | Status: DC | PRN
  Administered 2022-04-22 (×8): 80 ug via INTRAVENOUS

## 2022-04-22 MED ORDER — CHLORHEXIDINE GLUCONATE CLOTH 2 % EX PADS: 6.0000 | MEDICATED_PAD | Freq: Once | CUTANEOUS | Status: DC

## 2022-04-22 MED ORDER — DIAZEPAM 2 MG PO TABS: 2.0000 mg | ORAL_TABLET | Freq: Two times a day (BID) | ORAL | Status: DC | PRN

## 2022-04-22 MED ORDER — MAGTRACE LYMPHATIC TRACER
INTRAMUSCULAR | Status: DC | PRN
  Administered 2022-04-22: 3 mL via INTRAMUSCULAR

## 2022-04-22 MED ORDER — ONDANSETRON HCL 4 MG/2ML IJ SOLN
INTRAMUSCULAR | Status: AC
  Filled 2022-04-22: qty 2

## 2022-04-22 MED ORDER — DEXAMETHASONE SODIUM PHOSPHATE 10 MG/ML IJ SOLN
INTRAMUSCULAR | Status: DC | PRN
  Administered 2022-04-22: 10 mg via INTRAVENOUS

## 2022-04-22 MED ORDER — OXYCODONE HCL 5 MG PO TABS
5.0000 mg | ORAL_TABLET | ORAL | Status: DC | PRN
  Administered 2022-04-22: 10 mg via ORAL
  Administered 2022-04-22: 5 mg via ORAL
  Filled 2022-04-22: qty 1
  Filled 2022-04-22: qty 2

## 2022-04-22 MED ORDER — ONDANSETRON HCL 4 MG/2ML IJ SOLN
INTRAMUSCULAR | Status: DC | PRN
  Administered 2022-04-22: 4 mg via INTRAVENOUS

## 2022-04-22 MED ORDER — LIDOCAINE 2% (20 MG/ML) 5 ML SYRINGE
INTRAMUSCULAR | Status: DC | PRN
  Administered 2022-04-22: 60 mg via INTRAVENOUS

## 2022-04-22 MED ORDER — DIPHENHYDRAMINE HCL 50 MG/ML IJ SOLN: 12.5000 mg | Freq: Four times a day (QID) | INTRAMUSCULAR | Status: DC | PRN

## 2022-04-22 MED ORDER — KCL IN DEXTROSE-NACL 20-5-0.45 MEQ/L-%-% IV SOLN
INTRAVENOUS | Status: DC
  Filled 2022-04-22: qty 1000

## 2022-04-22 MED ORDER — MIDAZOLAM HCL 2 MG/2ML IJ SOLN
INTRAMUSCULAR | Status: AC
  Filled 2022-04-22: qty 2

## 2022-04-22 MED ORDER — CEFAZOLIN SODIUM-DEXTROSE 2-4 GM/100ML-% IV SOLN
2.0000 g | INTRAVENOUS | Status: AC
Start: 2022-04-22 — End: 2022-04-22
  Administered 2022-04-22: 2 g via INTRAVENOUS

## 2022-04-22 MED ORDER — FENTANYL CITRATE (PF) 100 MCG/2ML IJ SOLN
INTRAMUSCULAR | Status: AC
  Filled 2022-04-22: qty 2

## 2022-04-22 MED ORDER — CELECOXIB 200 MG PO CAPS
200.0000 mg | ORAL_CAPSULE | ORAL | Status: AC
  Administered 2022-04-22: 200 mg via ORAL

## 2022-04-22 MED ORDER — HYDROMORPHONE HCL 1 MG/ML IJ SOLN: 1.0000 mg | INTRAMUSCULAR | Status: DC | PRN

## 2022-04-22 MED ORDER — FENTANYL CITRATE (PF) 100 MCG/2ML IJ SOLN
INTRAMUSCULAR | Status: DC | PRN
  Administered 2022-04-22: 25 ug via INTRAVENOUS
  Administered 2022-04-22: 75 ug via INTRAVENOUS

## 2022-04-22 MED ORDER — SUGAMMADEX SODIUM 200 MG/2ML IV SOLN
INTRAVENOUS | Status: DC | PRN
  Administered 2022-04-22: 200 mg via INTRAVENOUS

## 2022-04-22 MED ORDER — ONDANSETRON HCL 4 MG/2ML IJ SOLN: 4.0000 mg | Freq: Four times a day (QID) | INTRAMUSCULAR | Status: DC | PRN

## 2022-04-22 MED ORDER — LIDOCAINE HCL (PF) 1 % IJ SOLN
INTRAMUSCULAR | Status: AC
  Filled 2022-04-22: qty 30

## 2022-04-22 MED ORDER — ZOLPIDEM TARTRATE 5 MG PO TABS: 5.0000 mg | ORAL_TABLET | Freq: Every evening | ORAL | Status: DC | PRN

## 2022-04-22 MED ORDER — ACETAMINOPHEN 500 MG PO TABS
1000.0000 mg | ORAL_TABLET | ORAL | Status: AC
  Administered 2022-04-22: 1000 mg via ORAL

## 2022-04-22 MED ORDER — CELECOXIB 200 MG PO CAPS
ORAL_CAPSULE | ORAL | Status: AC
  Filled 2022-04-22: qty 1

## 2022-04-22 MED ORDER — ROCURONIUM BROMIDE 100 MG/10ML IV SOLN
INTRAVENOUS | Status: DC | PRN
  Administered 2022-04-22: 60 mg via INTRAVENOUS

## 2022-04-22 MED ORDER — CEFAZOLIN SODIUM-DEXTROSE 2-4 GM/100ML-% IV SOLN
2.0000 g | Freq: Three times a day (TID) | INTRAVENOUS | Status: DC
  Administered 2022-04-22 – 2022-04-23 (×2): 2 g via INTRAVENOUS
  Filled 2022-04-22 (×2): qty 100

## 2022-04-22 MED ORDER — MIDAZOLAM HCL 5 MG/5ML IJ SOLN
INTRAMUSCULAR | Status: DC | PRN
  Administered 2022-04-22: 2 mg via INTRAVENOUS

## 2022-04-22 MED ORDER — BUPIVACAINE-EPINEPHRINE (PF) 0.5% -1:200000 IJ SOLN
INTRAMUSCULAR | Status: DC | PRN
  Administered 2022-04-22: 30 mL via PERINEURAL

## 2022-04-22 MED ORDER — POLYETHYLENE GLYCOL 3350 17 G PO PACK: 17.0000 g | PACK | Freq: Every day | ORAL | Status: DC | PRN

## 2022-04-22 MED ORDER — EPHEDRINE 5 MG/ML INJ
INTRAVENOUS | Status: AC
  Filled 2022-04-22: qty 10

## 2022-04-22 MED ORDER — CEFAZOLIN SODIUM-DEXTROSE 2-4 GM/100ML-% IV SOLN: 2.0000 g | INTRAVENOUS | Status: AC

## 2022-04-22 MED ORDER — SODIUM CHLORIDE 0.9 % IV SOLN
INTRAVENOUS | Status: DC | PRN
  Administered 2022-04-22: 100 mL

## 2022-04-22 MED ORDER — ACETAMINOPHEN 500 MG PO TABS
ORAL_TABLET | ORAL | Status: AC
  Filled 2022-04-22: qty 2

## 2022-04-22 MED ORDER — PROPOFOL 10 MG/ML IV BOLUS
INTRAVENOUS | Status: DC | PRN
  Administered 2022-04-22: 160 mg via INTRAVENOUS

## 2022-04-22 MED ORDER — DIPHENHYDRAMINE HCL 12.5 MG/5ML PO ELIX: 12.5000 mg | ORAL_SOLUTION | Freq: Four times a day (QID) | ORAL | Status: DC | PRN

## 2022-04-22 MED ORDER — SODIUM CHLORIDE 0.9 % IV SOLN
INTRAVENOUS | Status: AC
  Filled 2022-04-22: qty 10

## 2022-04-22 MED ORDER — DEXAMETHASONE SODIUM PHOSPHATE 10 MG/ML IJ SOLN
INTRAMUSCULAR | Status: AC
  Filled 2022-04-22: qty 1

## 2022-04-22 MED ORDER — ACETAMINOPHEN 325 MG PO TABS
325.0000 mg | ORAL_TABLET | Freq: Four times a day (QID) | ORAL | Status: DC
  Administered 2022-04-22 – 2022-04-23 (×2): 325 mg via ORAL
  Filled 2022-04-22 (×2): qty 1

## 2022-04-22 MED ORDER — SENNA 8.6 MG PO TABS: 1.0000 | ORAL_TABLET | Freq: Two times a day (BID) | ORAL | Status: DC

## 2022-04-22 MED ORDER — CLONIDINE HCL (ANALGESIA) 100 MCG/ML EP SOLN
EPIDURAL | Status: DC | PRN
  Administered 2022-04-22: 100 ug

## 2022-04-22 MED ORDER — EPHEDRINE SULFATE (PRESSORS) 50 MG/ML IJ SOLN
INTRAMUSCULAR | Status: DC | PRN
  Administered 2022-04-22: 10 mg via INTRAVENOUS
  Administered 2022-04-22: 5 mg via INTRAVENOUS

## 2022-04-22 MED ORDER — ONDANSETRON 4 MG PO TBDP: 4.0000 mg | ORAL_TABLET | Freq: Four times a day (QID) | ORAL | Status: DC | PRN

## 2022-04-22 MED ORDER — PROPOFOL 10 MG/ML IV BOLUS
INTRAVENOUS | Status: AC
  Filled 2022-04-22: qty 20

## 2022-04-22 MED ORDER — LIDOCAINE 2% (20 MG/ML) 5 ML SYRINGE
INTRAMUSCULAR | Status: AC
  Filled 2022-04-22: qty 5

## 2022-04-22 MED ORDER — GABAPENTIN 300 MG PO CAPS
300.0000 mg | ORAL_CAPSULE | ORAL | Status: AC
  Administered 2022-04-22: 300 mg via ORAL

## 2022-04-22 MED ORDER — PHENYLEPHRINE 80 MCG/ML (10ML) SYRINGE FOR IV PUSH (FOR BLOOD PRESSURE SUPPORT)
PREFILLED_SYRINGE | INTRAVENOUS | Status: AC
  Filled 2022-04-22: qty 10

## 2022-04-22 MED ORDER — CEFAZOLIN SODIUM-DEXTROSE 2-4 GM/100ML-% IV SOLN
INTRAVENOUS | Status: AC
  Filled 2022-04-22: qty 100

## 2022-04-22 MED ORDER — LACTATED RINGERS IV SOLN: INTRAVENOUS | Status: DC

## 2022-04-22 MED ORDER — ONDANSETRON HCL 4 MG/2ML IJ SOLN
4.0000 mg | Freq: Once | INTRAMUSCULAR | Status: AC | PRN
  Administered 2022-04-22: 4 mg via INTRAVENOUS

## 2022-04-22 MED ORDER — FENTANYL CITRATE (PF) 100 MCG/2ML IJ SOLN
25.0000 ug | INTRAMUSCULAR | Status: DC | PRN
  Administered 2022-04-22 (×2): 25 ug via INTRAVENOUS

## 2022-04-22 MED ORDER — MIDAZOLAM HCL 2 MG/2ML IJ SOLN
2.0000 mg | Freq: Once | INTRAMUSCULAR | Status: AC
  Administered 2022-04-22: 2 mg via INTRAVENOUS

## 2022-04-22 MED ORDER — GABAPENTIN 300 MG PO CAPS
ORAL_CAPSULE | ORAL | Status: AC
Start: 2022-04-22 — End: ?
  Filled 2022-04-22: qty 1

## 2022-04-22 MED ORDER — ROCURONIUM BROMIDE 10 MG/ML (PF) SYRINGE
PREFILLED_SYRINGE | INTRAVENOUS | Status: AC
  Filled 2022-04-22: qty 10

## 2022-04-22 MED ORDER — IBUPROFEN 200 MG PO TABS
400.0000 mg | ORAL_TABLET | Freq: Four times a day (QID) | ORAL | Status: DC
  Administered 2022-04-22 – 2022-04-23 (×2): 400 mg via ORAL
  Filled 2022-04-22 (×2): qty 2

## 2022-04-22 SURGICAL SUPPLY — 84 items
ADH SKN CLS APL DERMABOND .7 (GAUZE/BANDAGES/DRESSINGS) ×2
APL PRP STRL LF DISP 70% ISPRP (MISCELLANEOUS) ×2
APPLIER CLIP 9.375 MED OPEN (MISCELLANEOUS) ×6
APR CLP MED 9.3 20 MLT OPN (MISCELLANEOUS) ×4
BAG DECANTER FOR FLEXI CONT (MISCELLANEOUS) ×3 IMPLANT
BINDER BREAST LRG (GAUZE/BANDAGES/DRESSINGS) IMPLANT
BINDER BREAST MEDIUM (GAUZE/BANDAGES/DRESSINGS) IMPLANT
BINDER BREAST XLRG (GAUZE/BANDAGES/DRESSINGS) IMPLANT
BINDER BREAST XXLRG (GAUZE/BANDAGES/DRESSINGS) IMPLANT
BIOPATCH RED 1 DISK 7.0 (GAUZE/BANDAGES/DRESSINGS) ×3 IMPLANT
BLADE HEX COATED 2.75 (ELECTRODE) ×4 IMPLANT
BLADE SURG 10 STRL SS (BLADE) ×3 IMPLANT
BLADE SURG 15 STRL LF DISP TIS (BLADE) ×2 IMPLANT
BLADE SURG 15 STRL SS (BLADE) ×3
BNDG GAUZE ELAST 4 BULKY (GAUZE/BANDAGES/DRESSINGS) ×8 IMPLANT
CANISTER SUCT 1200ML W/VALVE (MISCELLANEOUS) ×4 IMPLANT
CHLORAPREP W/TINT 26 (MISCELLANEOUS) ×4 IMPLANT
CLIP APPLIE 9.375 MED OPEN (MISCELLANEOUS) ×2 IMPLANT
COVER BACK TABLE 60X90IN (DRAPES) ×4 IMPLANT
COVER MAYO STAND STRL (DRAPES) ×3 IMPLANT
COVER PROBE W GEL 5X96 (DRAPES) ×5 IMPLANT
DERMABOND ADVANCED (GAUZE/BANDAGES/DRESSINGS) ×1
DERMABOND ADVANCED .7 DNX12 (GAUZE/BANDAGES/DRESSINGS) ×2 IMPLANT
DEVICE DSSCT PLSMBLD 3.0S LGHT (MISCELLANEOUS) ×2 IMPLANT
DRAIN CHANNEL 19F RND (DRAIN) ×3 IMPLANT
DRAPE LAPAROSCOPIC ABDOMINAL (DRAPES) ×4 IMPLANT
DRAPE UTILITY XL STRL (DRAPES) ×4 IMPLANT
DRSG OPSITE POSTOP 4X6 (GAUZE/BANDAGES/DRESSINGS) ×1 IMPLANT
DRSG PAD ABDOMINAL 8X10 ST (GAUZE/BANDAGES/DRESSINGS) ×8 IMPLANT
DRSG TEGADERM 2-3/8X2-3/4 SM (GAUZE/BANDAGES/DRESSINGS) ×3 IMPLANT
ELECT BLADE 4.0 EZ CLEAN MEGAD (MISCELLANEOUS) ×3
ELECT COATED BLADE 2.86 ST (ELECTRODE) ×1 IMPLANT
ELECT REM PT RETURN 9FT ADLT (ELECTROSURGICAL) ×3
ELECTRODE BLDE 4.0 EZ CLN MEGD (MISCELLANEOUS) ×3 IMPLANT
ELECTRODE REM PT RTRN 9FT ADLT (ELECTROSURGICAL) ×3 IMPLANT
EVACUATOR SILICONE 100CC (DRAIN) ×4 IMPLANT
FUNNEL KELLER 2 DISP (MISCELLANEOUS) IMPLANT
GAUZE SPONGE 4X4 12PLY STRL LF (GAUZE/BANDAGES/DRESSINGS) ×4 IMPLANT
GLOVE BIO SURGEON STRL SZ 6.5 (GLOVE) ×9 IMPLANT
GLOVE BIO SURGEON STRL SZ7.5 (GLOVE) ×4 IMPLANT
GOWN STRL REUS W/ TWL LRG LVL3 (GOWN DISPOSABLE) ×9 IMPLANT
GOWN STRL REUS W/TWL LRG LVL3 (GOWN DISPOSABLE) ×15
GRAFT FLEX HD 6X16 PLIABLE (Tissue) ×1 IMPLANT
ILLUMINATOR WAVEGUIDE N/F (MISCELLANEOUS) IMPLANT
IMPL EXPANDER BREAST 535CC (Breast) IMPLANT
IMPLANT BREAST 535CC (Breast) ×1 IMPLANT
IMPLANT EXPANDER BREAST 535CC (Breast) ×2 IMPLANT
IV NS 1000ML (IV SOLUTION)
IV NS 1000ML BAXH (IV SOLUTION) IMPLANT
IV NS 500ML (IV SOLUTION) ×3
IV NS 500ML BAXH (IV SOLUTION) ×3 IMPLANT
KIT FILL ASEPTIC TRANSFER (MISCELLANEOUS) IMPLANT
LIGHT WAVEGUIDE WIDE FLAT (MISCELLANEOUS) IMPLANT
NS IRRIG 1000ML POUR BTL (IV SOLUTION) ×4 IMPLANT
PACK BASIN DAY SURGERY FS (CUSTOM PROCEDURE TRAY) ×4 IMPLANT
PAD FOAM SILICONE BACKED (GAUZE/BANDAGES/DRESSINGS) IMPLANT
PENCIL SMOKE EVACUATOR (MISCELLANEOUS) ×4 IMPLANT
PIN SAFETY STERILE (MISCELLANEOUS) ×4 IMPLANT
PLASMABLADE 3.0S W/LIGHT (MISCELLANEOUS) ×3
SLEEVE SCD COMPRESS KNEE MED (STOCKING) ×3 IMPLANT
SPIKE FLUID TRANSFER (MISCELLANEOUS) ×1 IMPLANT
SPONGE T-LAP 18X18 ~~LOC~~+RFID (SPONGE) ×8 IMPLANT
STRIP SUTURE WOUND CLOSURE 1/2 (MISCELLANEOUS) IMPLANT
SUT ETHILON 2 0 FS 18 (SUTURE) ×4 IMPLANT
SUT MNCRL AB 4-0 PS2 18 (SUTURE) ×4 IMPLANT
SUT MON AB 3-0 SH 27 (SUTURE) ×6
SUT MON AB 3-0 SH27 (SUTURE) ×2 IMPLANT
SUT MON AB 5-0 PS2 18 (SUTURE) IMPLANT
SUT PDS 3-0 CT2 (SUTURE) ×12
SUT PDS AB 2-0 CT2 27 (SUTURE) IMPLANT
SUT PDS II 3-0 CT2 27 ABS (SUTURE) IMPLANT
SUT SILK 2 0 SH (SUTURE) ×2 IMPLANT
SUT SILK 3 0 PS 1 (SUTURE) ×4 IMPLANT
SUT VIC AB 3-0 SH 27 (SUTURE)
SUT VIC AB 3-0 SH 27X BRD (SUTURE) IMPLANT
SUT VICRYL 3-0 CR8 SH (SUTURE) ×4 IMPLANT
SYR BULB IRRIG 60ML STRL (SYRINGE) ×4 IMPLANT
TOWEL GREEN STERILE FF (TOWEL DISPOSABLE) ×8 IMPLANT
TRACER MAGTRACE VIAL (MISCELLANEOUS) ×1 IMPLANT
TRAY DSU PREP LF (CUSTOM PROCEDURE TRAY) ×3 IMPLANT
TRAY FOLEY W/BAG SLVR 14FR LF (SET/KITS/TRAYS/PACK) IMPLANT
TUBE CONNECTING 20X1/4 (TUBING) ×4 IMPLANT
UNDERPAD 30X36 HEAVY ABSORB (UNDERPADS AND DIAPERS) ×8 IMPLANT
YANKAUER SUCT BULB TIP NO VENT (SUCTIONS) ×4 IMPLANT

## 2022-04-22 NOTE — Anesthesia Procedure Notes (Signed)
Procedure Name: Intubation Date/Time: 04/22/2022 11:08 AM Performed by: Lavonia Dana, CRNA Pre-anesthesia Checklist: Patient identified, Emergency Drugs available, Suction available and Patient being monitored Patient Re-evaluated:Patient Re-evaluated prior to induction Oxygen Delivery Method: Circle system utilized Preoxygenation: Pre-oxygenation with 100% oxygen Induction Type: IV induction Ventilation: Mask ventilation without difficulty Laryngoscope Size: Mac and 3 Grade View: Grade I Tube type: Oral Tube size: 7.0 mm Number of attempts: 1 Airway Equipment and Method: Stylet and Bite block Placement Confirmation: ETT inserted through vocal cords under direct vision, positive ETCO2 and breath sounds checked- equal and bilateral Secured at: 22 cm Tube secured with: Tape Dental Injury: Teeth and Oropharynx as per pre-operative assessment

## 2022-04-22 NOTE — Interval H&P Note (Signed)
History and Physical Interval Note:  04/22/2022 10:14 AM  Yvette Franklin  has presented today for surgery, with the diagnosis of LEFT BREAST DCIS.  The various methods of treatment have been discussed with the patient and family. After consideration of risks, benefits and other options for treatment, the patient has consented to  Procedure(s): LEFT MASTECTOMY WITH SENTINEL LYMPH NODE BIOPSY (Left) BREAST RECONSTRUCTION WITH PLACEMENT OF TISSUE EXPANDER AND FLEX HD (Left) as a surgical intervention.  The patient's history has been reviewed, patient examined, no change in status, stable for surgery.  I have reviewed the patient's chart and labs.  Questions were answered to the patient's satisfaction.     Loel Lofty Maximus Hoffert

## 2022-04-22 NOTE — Transfer of Care (Signed)
Immediate Anesthesia Transfer of Care Note  Patient: Vonna Kotyk  Procedure(s) Performed: LEFT MASTECTOMY WITH SENTINEL LYMPH NODE BIOPSY (Left: Breast) BREAST RECONSTRUCTION WITH PLACEMENT OF TISSUE EXPANDER AND FLEX HD (Left)  Patient Location: PACU  Anesthesia Type:GA combined with regional for post-op pain  Level of Consciousness: drowsy  Airway & Oxygen Therapy: Patient Spontanous Breathing and Patient connected to face mask oxygen  Post-op Assessment: Report given to RN and Post -op Vital signs reviewed and stable  Post vital signs: Reviewed and stable  Last Vitals:  Vitals Value Taken Time  BP 123/75 04/22/22 1306  Temp    Pulse 85 04/22/22 1310  Resp 15 04/22/22 1310  SpO2 100 % 04/22/22 1310  Vitals shown include unvalidated device data.  Last Pain:  Vitals:   04/22/22 0930  TempSrc: Oral  PainSc: 0-No pain         Complications: No notable events documented.

## 2022-04-22 NOTE — Anesthesia Postprocedure Evaluation (Signed)
Anesthesia Post Note  Patient: Yvette Franklin  Procedure(s) Performed: LEFT MASTECTOMY WITH SENTINEL LYMPH NODE BIOPSY (Left: Breast) BREAST RECONSTRUCTION WITH PLACEMENT OF TISSUE EXPANDER AND FLEX HD (Left)     Patient location during evaluation: PACU Anesthesia Type: General Level of consciousness: awake and alert Pain management: pain level controlled Vital Signs Assessment: post-procedure vital signs reviewed and stable Respiratory status: spontaneous breathing, nonlabored ventilation, respiratory function stable and patient connected to nasal cannula oxygen Cardiovascular status: blood pressure returned to baseline and stable Postop Assessment: no apparent nausea or vomiting Anesthetic complications: no   No notable events documented.  Last Vitals:  Vitals:   04/22/22 1615 04/22/22 1630  BP:    Pulse: 81 79  Resp: 18 16  Temp:    SpO2: 96% 98%    Last Pain:  Vitals:   04/22/22 1615  TempSrc:   PainSc: [place]

## 2022-04-22 NOTE — Op Note (Signed)
04/22/2022  12:14 PM  PATIENT:  Yvette Franklin  51 y.o. female  PRE-OPERATIVE DIAGNOSIS:  LEFT BREAST DCIS  POST-OPERATIVE DIAGNOSIS:  LEFT BREAST DCIS  PROCEDURE:  Procedure(s): LEFT MASTECTOMY WITH DEEP LEFT AXILLARY SENTINEL LYMPH NODE BIOPSY (Left)  SURGEON:  Surgeon(s) and Role: Panel 1:    * Jovita Kussmaul, MD - Primary  PHYSICIAN ASSISTANT:   ASSISTANTS: Dr. Marla Roe   ANESTHESIA:   general  EBL:  minimal   BLOOD ADMINISTERED:none  DRAINS: none   LOCAL MEDICATIONS USED:  NONE  SPECIMEN:  Source of Specimen:  left breast tissue and sentinel node x 2  DISPOSITION OF SPECIMEN:  PATHOLOGY  COUNTS:  YES  TOURNIQUET:  * No tourniquets in log *  DICTATION: .Dragon Dictation  After informed consent was obtained the patient was brought to the operating room and placed in the supine position on the operating table.  After adequate induction of general anesthesia the patient's bilateral chest, breast, and axillary areas were prepped with ChloraPrep, allowed to dry, and draped in usual sterile manner.  An appropriate timeout was performed.  At this point, 2 cc of iron oxide were injected in the subareolar plexus on the left and the breast was massaged for about 5 minutes.  An elliptical incision was then made around the nipple and areola complex in order to spare as much skin as possible.  The incision was carried through the skin and subcutaneous tissue sharply with the PlasmaBlade.  Breast hooks were used to elevate the skin flaps anteriorly towards the ceiling.  Thin skin flaps were then created by dissecting between the breast tissue and the subcutaneous fat and skin.  This dissection was carried circumferentially all the way to the chest wall.  Next the breast was removed from the pectoralis muscle with the pectoralis fascia.  Once this was accomplished the breast was marked with a stitch on the lateral skin and sent to pathology for further evaluation.  The mag trace was used  to identify a signal in the left axilla.  I was able to identify 2 lymph nodes with signal.  Each of these was excised sharply with the PlasmaBlade and the surrounding small vessels and lymphatics were controlled with clips.  The second sentinel node likely had 2 or 3 lymph nodes in the specimen.  All of them had signal.  No other hot or palpable nodes were then identified in the left axilla.  Hemostasis was achieved using the PlasmaBlade.  The skin flaps were healthy appearing.  At this point the operation was turned over to Dr. Marla Roe for the reconstruction.  Her portion will be dictated separately.  All needle sponge and instrument counts were correct.  The patient was in stable condition.  PLAN OF CARE: Admit for overnight observation  PATIENT DISPOSITION:  PACU - hemodynamically stable.   Delay start of Pharmacological VTE agent (>24hrs) due to surgical blood loss or risk of bleeding: no

## 2022-04-22 NOTE — Discharge Instructions (Addendum)
INSTRUCTIONS FOR AFTER BREAST SURGERY   You will likely have some questions about what to expect following your operation.  The following information will help you and your family understand what to expect when you are discharged from the hospital.  Following these guidelines will help ensure a smooth recovery and reduce risks of complications.  Postoperative instructions include information on: diet, wound care, medications and physical activity.  AFTER SURGERY Expect to go home after the procedure.  In some cases, you may need to spend one night in the hospital for observation.  DIET Breast surgery does not require a specific diet.  However, the healthier you eat the better your body can start healing. It is important to increasing your protein intake.  This means limiting the foods with sugar and carbohydrates.  Focus on vegetables and some meat.  If you have any liposuction during your procedure be sure to drink water.  If your urine is bright yellow, then it is concentrated, and you need to drink more water.  As a general rule after surgery, you should have 8 ounces of water every hour while awake.  If you find you are persistently nauseated or unable to take in liquids let us know.  NO TOBACCO USE or EXPOSURE.  This will slow your healing process and increase the risk of a wound.  WOUND CARE Leave the ACE wrap or binder on for 3 days . Use fragrance free soap.   After 3 days you can remove the ACE wrap or binder to shower. Once dry apply ACE wrap, binder or sports bra.  Use a mild soap like Dial, Dove and Ivory. You may have Topifoam or Lipofoam on.  It is soft and spongy and helps keep you from getting creases if you have liposuction.  This can be removed before the shower and then replaced.  If you need more it is available on Amazon (Lipofoam). If you have steri-strips / tape directly attached to your skin leave them in place. It is OK to get these wet.   No baths, pools or hot tubs for four  weeks. We close your incision to leave the smallest and best-looking scar. No ointment or creams on your incisions until given the go ahead.  Especially not Neosporin (Too many skin reactions with this one).  A few weeks after surgery you can use Mederma and start massaging the scar. We ask you to wear your binder or sports bra for the first 6 weeks around the clock, including while sleeping. This provides added comfort and helps reduce the fluid accumulation at the surgery site.  ACTIVITY No heavy lifting until cleared by the doctor.  This usually means no more than a half-gallon of milk.  It is OK to walk and climb stairs. In fact, moving your legs is very important to decrease your risk of a blood clot.  It will also help keep you from getting deconditioned.  Every 1 to 2 hours get up and walk for 5 minutes. This will help with a quicker recovery back to normal.  Let pain be your guide so you don't do too much.  This is not the time for spring cleaning and don't plan on taking care of anyone else.  This time is for you to recover,  You will be more comfortable if you sleep and rest with your head elevated either with a few pillows under you or in a recliner.  No stomach sleeping for a three months.  WORK Everyone   returns to work at different times. As a rough guide, most people take at least 1 - 2 weeks off prior to returning to work. If you need documentation for your job, bring the forms to your postoperative follow up visit.  DRIVING Arrange for someone to bring you home from the hospital.  You may be able to drive a few days after surgery but not while taking any narcotics or valium.  BOWEL MOVEMENTS Constipation can occur after anesthesia and while taking pain medication.  It is important to stay ahead for your comfort.  We recommend taking Milk of Magnesia (2 tablespoons; twice a day) while taking the pain pills.  MEDICATIONS You may be prescribed should start after surgery At your  preoperative visit for you history and physical you may have been given the following medications: An antibiotic: Start this medication when you get home and take according to the instructions on the bottle. Zofran 4 mg:  This is to treat nausea and vomiting.  You can take this every 6 hours as needed and only if needed. Valium 2 mg: This is for muscle tightness if you have an implant or expander. This will help relax your muscle which also helps with pain control.  This can be taken every 12 hours as needed. Don't drive after taking this medication. Norco (hydrocodone/acetaminophen) 5/325 mg:  This is only to be used after you have taken the motrin or the tylenol. Every 8 hours as needed.   Over the counter Medication to take: Ibuprofen (Motrin) 600 mg:  Take this every 6 hours.  If you have additional pain then take 500 mg of the tylenol every 8 hours.  Only take the Norco after you have tried these two. Miralax or stool softener of choice: Take this according to the bottle if you take the [place] Call your surgeon's office if any of the following occur: Fever 101 degrees F or greater Excessive bleeding or fluid from the incision site. Pain that increases over time without aid from the medications Redness, warmth, or pus draining from incision sites Persistent nausea or inability to take in liquids Severe misshapen area that underwent the operation.  Here are some resources:  Plastic surgery website: https://www.plasticsurgery.org/for-medical-professionals/education-and-resources/publications/breast-reconstruction-magazine Breast Reconstruction Awareness Campaign:  [employer] Plastic surgery Implant information:  https://www.plasticsurgery.org/patient-safety/breast-implant-safety  About my Jackson-Pratt Bulb Drain  What is a Jackson-Pratt bulb? A Jackson-Pratt is a soft, round device used to collect drainage. It is connected to a long, thin drainage  catheter, which is held in place by one or two small stiches near your surgical incision site. When the bulb is squeezed, it forms a vacuum, forcing the drainage to empty into the bulb.  Emptying the Jackson-Pratt bulb- To empty the bulb: 1. Release the plug on the top of the bulb. 2. Pour the bulb's contents into a measuring container which your nurse will provide. 3. Record the time emptied and amount of drainage. Empty the drain(s) as often as your     doctor or nurse recommends.  Date                  Time                    Amount (Drain 1)                 Amount (Drain 2)  _____________________________________________________________________  _____________________________________________________________________  _____________________________________________________________________  _____________________________________________________________________  _____________________________________________________________________  _____________________________________________________________________  _____________________________________________________________________  _____________________________________________________________________  Loran Senters the  Jackson-Pratt Bulb- To squeeze the bulb: 1. Make sure the plug at the top of the bulb is open. 2. Squeeze the bulb tightly in your fist. You will hear air squeezing from the bulb. 3. Replace the plug while the bulb is squeezed. 4. Use a safety pin to attach the bulb to your clothing. This will keep the catheter from     pulling at the bulb insertion site.  When to call your doctor- Call your doctor if: Drain site becomes red, swollen or hot. You have a fever greater than 101 degrees F. There is oozing at the drain site. Drain falls out (apply a guaze bandage over the drain hole and secure it with tape). Drainage increases daily not related to activity patterns. (You will usually have more drainage when you are active than when you  are resting.) Drainage has a bad odor.

## 2022-04-22 NOTE — Anesthesia Procedure Notes (Signed)
Anesthesia Regional Block: Pectoralis block   Pre-Anesthetic Checklist: , timeout performed,  Correct Patient, Correct Site, Correct Laterality,  Correct Procedure, Correct Position, site marked,  Risks and benefits discussed,  Surgical consent,  Pre-op evaluation,  At surgeon's request and post-op pain management  Laterality: Left  Prep: chloraprep       Needles:  Injection technique: Single-shot  Needle Type: Echogenic Needle     Needle Length: 9cm  Needle Gauge: 21     Additional Needles:   Procedures:,,,, ultrasound used (permanent image in chart),,    Narrative:  Start time: 04/22/2022 9:55 AM End time: 04/22/2022 10:02 AM Injection made incrementally with aspirations every 5 mL.  Performed by: Personally  Anesthesiologist: Santa Lighter, MD  Additional Notes: No pain on injection. No increased resistance to injection. Injection made in 5cc increments.  Good needle visualization.  Patient tolerated procedure well.

## 2022-04-22 NOTE — Anesthesia Preprocedure Evaluation (Signed)
Anesthesia Evaluation  Patient identified by MRN, date of birth, ID band Patient awake    Reviewed: Allergy & Precautions, NPO status , Patient's Chart, lab work & pertinent test results  History of Anesthesia Complications (+) PONV and history of anesthetic complications  Airway Mallampati: II  TM Distance: >3 FB Neck ROM: Full    Dental  (+) Teeth Intact, Dental Advisory Given   Pulmonary neg pulmonary ROS,    Pulmonary exam normal breath sounds clear to auscultation       Cardiovascular hypertension, Pt. on medications Normal cardiovascular exam Rhythm:Regular Rate:Normal     Neuro/Psych  Headaches, PSYCHIATRIC DISORDERS Depression    GI/Hepatic negative GI ROS, Neg liver ROS,   Endo/Other  Obesity   Renal/GU negative Renal ROS     Musculoskeletal negative musculoskeletal ROS (+)   Abdominal   Peds  Hematology negative hematology ROS (+)   Anesthesia Other Findings Day of surgery medications reviewed with the patient.  LEFT BREAST DCIS  Reproductive/Obstetrics                             Anesthesia Physical Anesthesia Plan  ASA: 2  Anesthesia Plan: General   Post-op Pain Management: Tylenol PO (pre-op)* and Regional block*   Induction:   PONV Risk Score and Plan: 4 or greater and Midazolam, Dexamethasone and Ondansetron  Airway Management Planned: Oral ETT  Additional Equipment:   Intra-op Plan:   Post-operative Plan: Extubation in OR  Informed Consent: I have reviewed the patients History and Physical, chart, labs and discussed the procedure including the risks, benefits and alternatives for the proposed anesthesia with the patient or authorized representative who has indicated his/her understanding and acceptance.     Dental advisory given  Plan Discussed with: CRNA  Anesthesia Plan Comments:         Anesthesia Quick Evaluation

## 2022-04-22 NOTE — H&P (Signed)
PROVIDER: Landry Corporal, MD  MRN: [id number] DOB: May 12, 1971 Subjective   Chief Complaint: Follow-up   History of Present Illness: Yvette Franklin is a 51 y.o. female who is seen today for left breast cancer. 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 She underwent an MRI which estimated the size of the area to be approximately 7 cm. 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 She returns today to discuss the surgical options.    Review of Systems: A complete review of systems was obtained from the patient. I have reviewed this information and discussed as appropriate with the patient. See HPI as well for other ROS.  ROS   Medical History: Past Medical History:  Diagnosis Date   Hypertension   Patient Active Problem List  Diagnosis   Ductal carcinoma in situ (DCIS) of left breast   Past Surgical History:  Procedure Laterality Date   CHOLECYSTECTOMY N/A    No Known Allergies  Current Outpatient Medications on File Prior to Visit  Medication Sig Dispense Refill   losartan-hydroCHLOROthiazide (HYZAAR) 100-12.5 mg tablet Take 1 tablet by mouth once daily   No current facility-administered medications on file prior to visit.   Family History  Problem Relation Age of Onset   Breast cancer Brother    Social History   Tobacco Use  Smoking Status Never  Smokeless Tobacco Never    Social History   Socioeconomic History   Marital status: Unknown  Tobacco Use   Smoking status: Never   Smokeless tobacco: Never  Vaping Use   Vaping Use: Never used  Substance and Sexual Activity   Alcohol use: Never   Drug use: Never   Objective:   There were no vitals filed for this visit.  There is no height or weight on file to calculate BMI.  Physical Exam Vitals reviewed.  Constitutional:   General: She is not in acute distress. Appearance: Normal appearance.  HENT:  Head: Normocephalic and atraumatic.  Right Ear: External ear normal.  Left Ear: External ear normal.  Nose: Nose normal.  Mouth/Throat:  Mouth: Mucous membranes are moist.  Pharynx: Oropharynx is clear.  Eyes:  General: No scleral icterus. Extraocular Movements: Extraocular movements intact.  Conjunctiva/sclera: Conjunctivae normal.  Pupils: Pupils are equal, round, and reactive to light.  Cardiovascular:  Rate and Rhythm: Normal rate and regular rhythm.  Pulses: Normal pulses.  Heart sounds: Normal heart sounds.  Pulmonary:  Effort: Pulmonary effort is normal. No respiratory distress.  Breath sounds: Normal breath sounds.  Abdominal:  General: Bowel sounds are normal.  Palpations: Abdomen is soft.  Tenderness: There is no abdominal tenderness.  Musculoskeletal:  General: No swelling, tenderness or deformity. Normal range of motion.  Cervical back: Normal range of motion and neck supple.  Skin: General: Skin is warm and dry.  Coloration: Skin is not jaundiced.  Neurological:  General: No focal deficit present.  Mental Status: She is alert and oriented to person, place, and time.  Psychiatric:  Mood and Affect: Mood normal.  Behavior: Behavior normal.     Breast: There is no palpable mass in either breast. There is no palpable axillary, supraclavicular, or cervical lymphadenopathy.  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 Given the extent of the area my recommendation would be for mastectomy and she is interested in reconstruction. She would also be a good candidate for sentinel node biopsy. She has asked that I talk to the plastic surgeon about the possibility of a  staged breast conservation oncoplastic type surgery and I will discuss this with her. We will let her know what we think and then we can proceed with surgical planning.

## 2022-04-22 NOTE — Interval H&P Note (Signed)
History and Physical Interval Note:  04/22/2022 10:25 AM  Yvette Franklin  has presented today for surgery, with the diagnosis of LEFT BREAST DCIS.  The various methods of treatment have been discussed with the patient and family. After consideration of risks, benefits and other options for treatment, the patient has consented to  Procedure(s): LEFT MASTECTOMY WITH SENTINEL LYMPH NODE BIOPSY (Left) BREAST RECONSTRUCTION WITH PLACEMENT OF TISSUE EXPANDER AND FLEX HD (Left) as a surgical intervention.  The patient's history has been reviewed, patient examined, no change in status, stable for surgery.  I have reviewed the patient's chart and labs.  Questions were answered to the patient's satisfaction.     Autumn Messing III

## 2022-04-22 NOTE — Op Note (Signed)
Op report    DATE OF OPERATION:  04/22/2022  LOCATION: [place]  SURGICAL DIVISION: Plastic Surgery  PREOPERATIVE DIAGNOSES:  1. Left Breast cancer - upper outer quadrant.    POSTOPERATIVE DIAGNOSES:  Same as preoperative diagnosis   PROCEDURE:  1. Left immediate breast reconstruction with placement of Acellular Dermal Matrix and tissue expanders.  SURGEON: Keysha Damewood Sanger Delane Wessinger, DO  ASSISTANT: Donnamarie Rossetti, PA  ANESTHESIA:  General.   COMPLICATIONS: None.   IMPLANTS: Left - Mentor 535 cc. Ref #[id number].  Serial Number [id number], 150 cc of injectable saline placed in the expander. Acellular Dermal Matrix 6 x 16 cm Flex HD  INDICATIONS FOR PROCEDURE:  The patient, Yvette Franklin, is a 51 y.o. female born on 05-09-1971, is here for  immediate first stage breast reconstruction with placement of left tissue expander and Acellular dermal matrix. MRN: [id number]  CONSENT:  Informed consent was obtained directly from the patient. Risks, benefits and alternatives were fully discussed. Specific risks including but not limited to bleeding, infection, hematoma, seroma, scarring, pain, implant infection, implant extrusion, capsular contracture, asymmetry, wound healing problems, and need for further surgery were all discussed. The patient did have an ample opportunity to have her questions answered to her satisfaction.   DESCRIPTION OF PROCEDURE:  The patient was taken to the operating room by the general surgery team. SCDs were placed and IV antibiotics were given. The patient's chest was prepped and draped in a sterile fashion. A time out was performed and the implants to be used were identified.  A left mastectomy was performed.  Once the general surgery team had completed their portion of the case the patient was rendered to the plastic and reconstructive surgery team.  Left:  The pectoralis major muscle was lifted from the chest wall with release of the  lateral edge and lateral inframammary fold.  The pocket was irrigated with antibiotic solution and hemostasis was achieved with electrocautery.  The ADM was then prepared according to the manufacture guidelines and slits placed to help with postoperative fluid management.  The ADM was then sutured to the inferior and lateral edge of the inframammary fold with 2-0 PDS starting with an interrupted stitch and then a running stitch.  The lateral portion was sutured to with interrupted sutures after the expander was placed.  The expander was prepared according to the manufacture guidelines, the air evacuated and then it was placed under the ADM and pectoralis major muscle.  The inferior and lateral tabs were used to secure the expander to the chest wall with 2-0 PDS.  The drain was placed at the inframammary fold over the ADM and secured to the skin with 3-0 Silk.  The deep layers were closed with 3-0 Monocryl followed by 4-0 Monocryl.  The skin was closed with 5-0 Monocryl and then dermabond was applied.  The ABDs and breast binder were placed.  The patient tolerated the procedure well and there were no complications.  The patient was allowed to wake from anesthesia and taken to the recovery room in satisfactory condition.   The advanced practice practitioner (APP) assisted throughout the case.  The APP was essential in retraction and counter traction when needed to make the case progress smoothly.  This retraction and assistance made it possible to see the tissue plans for the procedure.  The assistance was needed for blood control, tissue re-approximation and assisted with closure of the incision site.

## 2022-04-22 NOTE — Progress Notes (Signed)
Assisted Dr. Turk with left, pectoralis, ultrasound guided block. Side rails up, monitors on throughout procedure. See vital signs in flow sheet. Tolerated Procedure well. 

## 2022-04-23 ENCOUNTER — Encounter (HOSPITAL_BASED_OUTPATIENT_CLINIC_OR_DEPARTMENT_OTHER): Payer: Self-pay | Admitting: General Surgery

## 2022-04-23 DIAGNOSIS — Z79899 Other long term (current) drug therapy: Secondary | ICD-10-CM | POA: Diagnosis not present

## 2022-04-23 DIAGNOSIS — D0512 Intraductal carcinoma in situ of left breast: Secondary | ICD-10-CM | POA: Diagnosis not present

## 2022-04-23 DIAGNOSIS — I10 Essential (primary) hypertension: Secondary | ICD-10-CM | POA: Diagnosis not present

## 2022-04-23 DIAGNOSIS — N641 Fat necrosis of breast: Secondary | ICD-10-CM | POA: Diagnosis not present

## 2022-04-23 NOTE — Discharge Summary (Signed)
Physician Discharge Summary  Patient ID: Yvette Franklin MRN: [id number] DOB/AGE: 18-Mar-1971 51 y.o.  Admit date: 04/22/2022 Discharge date: 04/23/2022  Admission Diagnoses:  Discharge Diagnoses:  Principal Problem:   Breast cancer [facility])   Discharged Condition: good  Hospital Course: The patient was taken to the operating room and underwent a left mastectomy with expander placement.  She was managed in the post recovery area.  She did well overnight and was eating and voiding without difficulty.  Pain was well controlled. She was ready to go home.  Son was at the bedside.  Consults: None  Significant Diagnostic Studies: none  Treatments: surgery  Discharge Exam: Blood pressure 109/68, pulse 74, temperature 97.9 F (36.6 C), resp. rate 16, height '5\' 4"'$  (1.626 m), weight 89 kg, SpO2 95 %. General appearance: alert, cooperative, and no distress Incision/Wound: Dressing in place.  No sign of hematoma  Disposition: Discharge disposition: 01-Home or Self Care       Discharge Instructions     Call MD for:  difficulty breathing, headache or visual disturbances   Complete by: As directed    Call MD for:  persistant nausea and vomiting   Complete by: As directed    Call MD for:  redness, tenderness, or signs of infection (pain, swelling, redness, odor or green/yellow discharge around incision site)   Complete by: As directed    Call MD for:  severe uncontrolled pain   Complete by: As directed    Call MD for:  temperature >100.4   Complete by: As directed    Diet general   Complete by: As directed    Discharge wound care:   Complete by: As directed    Drain Care   Driving Restrictions   Complete by: As directed    No driving while on pain meds. Best not to drive for a week after surgery.   Increase activity slowly   Complete by: As directed    Lifting restrictions   Complete by: As directed    No heavy lifting      Allergies as of 04/23/2022   No Known Allergies       Medication List     TAKE these medications    BIOTIN-CHOLINE-SILICON PO Take by mouth.   diazepam 2 MG tablet Commonly known as: Valium Take 1 tablet (2 mg total) by mouth every 12 (twelve) hours as needed for muscle spasms.   Fish Oil 1000 MG Caps Take by mouth.   losartan-hydrochlorothiazide 100-12.5 MG tablet Commonly known as: HYZAAR Take 1 tablet by mouth daily.   ondansetron 4 MG disintegrating tablet Commonly known as: ZOFRAN-ODT Take 1 tablet (4 mg total) by mouth every 8 (eight) hours as needed for nausea or vomiting.   Xiidra 5 % Soln Generic drug: Lifitegrast SMARTSIG:1 Drop(s) In Eye(s)   Zinc 50 MG Tabs Take by mouth.               Discharge Care Instructions  (From admission, onward)           Start     Ordered   04/23/22 0000  Discharge wound care:       Comments: Drain Care   04/23/22 0700            Follow-up Information     Malachy Coleman, Loel Lofty, DO Follow up in 10 day(s).   Specialty: Plastic Surgery Contact information: [address] [place] [phone]  SignedLoel Lofty Milanya Sunderland 04/23/2022, 7:11 AM

## 2022-04-24 LAB — SURGICAL PATHOLOGY

## 2022-04-27 ENCOUNTER — Encounter: Payer: Self-pay | Admitting: *Deleted

## 2022-05-01 ENCOUNTER — Telehealth: Payer: Self-pay | Admitting: Plastic Surgery

## 2022-05-01 ENCOUNTER — Encounter: Payer: Self-pay | Admitting: Plastic Surgery

## 2022-05-01 ENCOUNTER — Ambulatory Visit (INDEPENDENT_AMBULATORY_CARE_PROVIDER_SITE_OTHER): Payer: BC Managed Care – PPO | Admitting: Plastic Surgery

## 2022-05-01 DIAGNOSIS — D0512 Intraductal carcinoma in situ of left breast: Secondary | ICD-10-CM

## 2022-05-01 NOTE — Progress Notes (Signed)
   Subjective:    Patient ID: Yvette Franklin, female    DOB: Jan 29, 1971, 51 y.o.   MRN: [id number]  The patient is a 51 year old female here for follow-up after undergoing a left mastectomy on 04/22/2022.  She has a Mentor 535 expander in place and had 100 cc placed at the time of the operation.  Drain output is minimal but not ready for removal.  Her dressings are in place.  There is no sign of seroma or hematoma.  She is accompanied by her husband and her son.      Review of Systems  Constitutional:  Positive for activity change.  Eyes: Negative.   Respiratory: Negative.    Endocrine: Negative.   Genitourinary: Negative.        Objective:   Physical Exam Constitutional:      Appearance: Normal appearance.  Cardiovascular:     Rate and Rhythm: Normal rate.     Pulses: Normal pulses.  Skin:    Capillary Refill: Capillary refill takes less than 2 seconds.  Neurological:     Mental Status: She is alert and oriented to person, place, and time.         Assessment & Plan:     ICD-10-CM   1. Ductal carcinoma in situ (DCIS) of left breast  D05.12       We placed injectable saline in the Expander using a sterile technique: Left: 70 cc for a total of 220 / 535 cc Plan for removal of the drain at her next visit.  We will also make sure she has an appointment to see Dr. Marlou Starks in the next 1-2 weeks.

## 2022-05-01 NOTE — Telephone Encounter (Signed)
Patient wants to know status of FMLA paperwork. Please advise at [phone].

## 2022-05-04 NOTE — Addendum Note (Signed)
Addended by: Harl Bowie on: 05/04/2022 03:34 PM   Modules accepted: Orders

## 2022-05-05 ENCOUNTER — Telehealth: Payer: Self-pay | Admitting: Hematology and Oncology

## 2022-05-05 NOTE — Telephone Encounter (Signed)
Per 6/20 phone lines pt's son called and requested a sooner appointment  appointment was changed per pt request

## 2022-05-06 ENCOUNTER — Inpatient Hospital Stay: Payer: BC Managed Care – PPO | Attending: Hematology and Oncology | Admitting: Hematology and Oncology

## 2022-05-06 ENCOUNTER — Other Ambulatory Visit: Payer: Self-pay

## 2022-05-06 ENCOUNTER — Other Ambulatory Visit (HOSPITAL_COMMUNITY): Payer: Self-pay

## 2022-05-06 DIAGNOSIS — Z79899 Other long term (current) drug therapy: Secondary | ICD-10-CM | POA: Insufficient documentation

## 2022-05-06 DIAGNOSIS — D0512 Intraductal carcinoma in situ of left breast: Secondary | ICD-10-CM | POA: Diagnosis not present

## 2022-05-06 MED ORDER — TAMOXIFEN CITRATE 10 MG PO TABS: 5.0000 mg | ORAL_TABLET | Freq: Every day | ORAL | 1 refills | Status: DC

## 2022-05-06 NOTE — Assessment & Plan Note (Addendum)
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 Breast conserving surgery 04/22/22: HG DCIS, margins Neg, 0/4 LN Neg, ER 20%, PR 0% 2. Followed by adjuvant radiation therapy 3. Followed by antiestrogen therapy with tamoxifen 5 years  Pathology counseling: I discussed the final pathology report of the patient provided  a copy of this report. I discussed the margins.  We also discussed the final staging along with previously performed ER/PR testing.  RTC after RT is complete

## 2022-05-06 NOTE — Progress Notes (Signed)
Patient Care Team: Vevelyn Francois, NP as PCP - General (Adult Health Nurse Practitioner) Jovita Kussmaul, MD as Consulting Physician (General Surgery) Nicholas Lose, MD as Consulting Physician (Hematology and Oncology) Kyung Rudd, MD as Consulting Physician (Radiation Oncology) Mauro Kaufmann, RN as Oncology Nurse Navigator Rockwell Germany, RN as Oncology Nurse Navigator  DIAGNOSIS:  Encounter Diagnosis  Name Primary?   Ductal carcinoma in situ (DCIS) of left breast     SUMMARY OF ONCOLOGIC HISTORY: Oncology History  Ductal carcinoma in situ (DCIS) of left breast  02/09/2022 Initial Diagnosis   Screening mammogram detected left breast asymmetry and calcifications 1.5 cm stereotactic biopsy revealed high-grade DCIS ER 20% weak, PR 0% Right breast asymmetry: Ultrasound: Micro 6 6 mm: Biopsy pending   02/18/2022 Cancer Staging   Staging form: Breast, AJCC 8th Edition - Clinical: Stage 0 (cTis (DCIS), cN0, cM0, G3, ER+, PR-, HER2: Not Assessed) - Signed by Nicholas Lose, MD on 02/18/2022 Stage prefix: Initial diagnosis Histologic grading system: 3 grade system   02/26/2022 Genetic Testing   gative hereditary cancer genetic testing: no pathogenic variants detected in [facility]Next-Cancer +RNAinsight Panel.  Report date is February 26, 2022.   The CustomNext-Cancer+RNAinsight panel offered by [facility] includes sequencing and rearrangement analysis for the following 47 genes:  APC, ATM, AXIN2, BARD1, BMPR1A, BRCA1, BRCA2, BRIP1, CDH1, CDK4, CDKN2A, CHEK2, DICER1, EPCAM, GREM1, HOXB13, MEN1, MLH1, MSH2, MSH3, MSH6, MUTYH, NBN, NF1, NF2, NTHL1, PALB2, PMS2, POLD1, POLE, PTEN, RAD51C, RAD51D, RECQL, RET, SDHA, SDHAF2, SDHB, SDHC, SDHD, SMAD4, SMARCA4, STK11, TP53, TSC1, TSC2, and VHL.  RNA data is routinely analyzed for use in variant interpretation for all genes.    04/22/2022 Surgery   Left Lumpectomy: HG DCIS, margins Neg, 0/4 LN Neg, ER 20%, PR 0%     CHIEF COMPLIANT: Follow-up  after surgery  INTERVAL HISTORY: Yvette Franklin is a 51 y.o female is here because of recent diagnosis of left breast DCIS. She presents to the clinic today for a follow-up to discuss pathology report. Denies pain and discomfort in the breast. States that the surgery went well.   ALLERGIES:  has No Known Allergies.  MEDICATIONS:  Current Outpatient Medications  Medication Sig Dispense Refill   BIOTIN-CHOLINE-SILICON PO Take by mouth.     diazepam (VALIUM) 2 MG tablet Take 1 tablet (2 mg total) by mouth every 12 (twelve) hours as needed for muscle spasms. 20 tablet 0   HYDROcodone-acetaminophen (NORCO/VICODIN) 5-325 MG tablet Take 1 tablet by mouth every 6 (six) hours as needed.     losartan-hydrochlorothiazide (HYZAAR) 100-12.5 MG tablet Take 1 tablet by mouth daily. 90 tablet 3   Omega-3 Fatty Acids (FISH OIL) 1000 MG CAPS Take by mouth.     ondansetron (ZOFRAN-ODT) 4 MG disintegrating tablet Take 1 tablet (4 mg total) by mouth every 8 (eight) hours as needed for nausea or vomiting. 20 tablet 0   tamoxifen (NOLVADEX) 10 MG tablet Take 0.5 tablets (5 mg total) by mouth daily. 90 tablet 1   XIIDRA 5 % SOLN SMARTSIG:1 Drop(s) In Eye(s)     Zinc 50 MG TABS Take by mouth.     No current facility-administered medications for this visit.    PHYSICAL EXAMINATION: ECOG PERFORMANCE STATUS: 1 - Symptomatic but completely ambulatory  Vitals:   05/06/22 1141  BP: 116/73  Pulse: 78  Resp: 18  Temp: (!) 97.3 F (36.3 C)  SpO2: 99%   Filed Weights   05/06/22 1141  Weight: 199 lb  3.2 oz (90.4 kg)      LABORATORY DATA:  I have reviewed the data as listed    Latest Ref Rng & Units 04/17/2022    2:30 PM 02/18/2022   12:03 PM 06/30/2021   12:04 PM  CMP  Glucose 70 - 99 mg/dL 105  109  90   BUN 6 - 20 mg/dL _0 Creatinine 0.44 - 1.00 mg/dL 0.77  0.83  0.84   Sodium 135 - 145 mmol/L 142  140  143   Potassium 3.5 - 5.1 mmol/L 3.8  4.1  4.3   Chloride 98 - 111 mmol/L 108  104  106    CO2 22 - 32 mmol/L 28  30    Calcium 8.9 - 10.3 mg/dL 9.3  9.9  9.7   Total Protein 6.5 - 8.1 g/dL  7.7  6.9   Total Bilirubin 0.3 - 1.2 mg/dL  0.3  0.4   Alkaline Phos 38 - 126 U/L  67  86   AST 15 - 41 U/L  14  17   ALT 0 - 44 U/L  11      Lab Results  Component Value Date   WBC 6.0 02/18/2022   HGB 13.7 02/18/2022   HCT 41.7 02/18/2022   MCV 89.5 02/18/2022   PLT 293 02/18/2022   NEUTROABS 3.1 02/18/2022    ASSESSMENT & PLAN:  Ductal carcinoma in situ (DCIS) of left breast   02/09/2022:Screening mammogram detected left breast asymmetry and calcifications 1.5 cm stereotactic biopsy revealed high-grade DCIS ER 20% weak, PR 0% Right breast asymmetry: Ultrasound: Micro 6 6 mm: Biopsy done today  Recommendation: 1.  Left mastectomy 04/22/22: HG DCIS, margins Neg, 0/4 LN Neg, ER 20%, PR 0% 2. Followed by antiestrogen therapy with tamoxifen 5 years   Pathology counseling: I discussed the final pathology report of the patient provided  a copy of this report. I discussed the margins.  We also discussed the final staging along with previously performed ER/PR testing.  We are using a 5 mg dose of tamoxifen based upon TAM-01 clinical trial RTC in 3 months for survivorship care plan visit   No orders of the defined types were placed in this encounter.  The patient has a good understanding of the overall plan. she agrees with it. she will call with any problems that may develop before the next visit here. Total time spent: 30 mins including face to face time and time spent for planning, charting and co-ordination of care   Harriette Ohara, MD 05/06/22    I Gardiner Coins am scribing for Dr. Lindi Adie  I have reviewed the above documentation for accuracy and completeness, and I agree with the above.

## 2022-05-11 ENCOUNTER — Telehealth: Payer: Self-pay | Admitting: Plastic Surgery

## 2022-05-11 NOTE — Telephone Encounter (Signed)
Patient's son Islam called to inquire about process for completing the FMLA paperwork needed for the patient's care after her upcoming surgery. Forms need completion for spouse of patient.   Advised Islam of the $25 fee for completing forms. He stated the forms will be sent over via MyChart including fax number for completed ppwrk to be sent to. Islam was made aware we have to receive the $25 fee before sending out the completed paperwork.

## 2022-05-11 NOTE — Progress Notes (Addendum)
Patient is a 51 year old female with history of left breast cancer. Patient underwent left mastectomy with deep left axillary sentinel lymph node biopsy with Dr. Marlou Starks and left immediate breast reconstruction with placement of acellular dermal matrix and tissue expander with Dr. Marla Roe on 04/22/22. Patient presents today for postoperative follow up.   Patient was last seen in the clinic on 05/01/22. At this visit, patient was doing well. There was no sign of hematoma or seroma. Her drain output was minimal but not ready to be removed. Planned to have drain removed at next visit. Her left expander was filled with 70 cc for a total of 220 cc / 535 cc.   Today, patient is accompanied by her son.  Patient reports she is doing better than she was before.  She states that she does feel a little nauseous after being started on a medication by her oncologist recently.  She denies any other issues.  She denies any fevers or chills.  She denies any issues at the surgical site.  She states the drain has been putting out very little.  She reports that its been putting out 5 cc/day at most for the last few days.  Patient states she would like to have her expander fill today.  Chaperone present on exam.  On exam, patient is sitting upright in no acute distress.  Left breast is soft.  There is some minor swelling.  There is no erythema or ecchymosis noted to the left breast.  Honeycomb dressing was removed.  Incision is intact with Steri-Strips.  There is no cellulitic changes to the skin.  There is no drainage from the incision.  Drain is in place and functioning.  There is approximately 2 cc of serous fluid in the bulb.  Left drain was removed. Vaseline and gauze were placed over drain site.  Attempted expander fill today.  Area was prepped with alcohol wipe and butterfly needle was inserted.  Patient then became lightheaded.  Needle was removed.  Patient had very brief vasovagal episode while on the exam table.   Patient was laid back and feet were elevated.  Vitals were obtained and found to be stable.  Patient was given water and reported she felt better shortly after.  She denied any chest pain or shortness of breath.  Patient was monitor for short period of time.  Patient was able to walk around the clinic without issue or reoccurring symptoms.  Patient stated she felt comfortable going home.    Discussed with patient that if she develops any chest pain, shortness of breath, dizziness, lightheadedness or if her symptoms were to reoccur or worsen, that she should go to the emergency room.  I encouraged the patient to drink plenty of water.  Patient and patient's son acknowledged.  Discussed with patient that she should reach out to her oncologist regarding her nausea and her new medication.  Patient acknowledged.  Discussed with patient that she may put Vaseline daily over the drain site.  Discussed that she should leave the Steri-Strips in place until they fall off on their own.  Patient to follow-up in the clinic next week.

## 2022-05-12 ENCOUNTER — Ambulatory Visit (INDEPENDENT_AMBULATORY_CARE_PROVIDER_SITE_OTHER): Payer: BC Managed Care – PPO | Admitting: Student

## 2022-05-12 ENCOUNTER — Ambulatory Visit: Payer: BC Managed Care – PPO | Admitting: Hematology and Oncology

## 2022-05-12 DIAGNOSIS — D0512 Intraductal carcinoma in situ of left breast: Secondary | ICD-10-CM

## 2022-05-14 ENCOUNTER — Encounter: Payer: Self-pay | Admitting: *Deleted

## 2022-05-14 DIAGNOSIS — D0512 Intraductal carcinoma in situ of left breast: Secondary | ICD-10-CM

## 2022-05-15 ENCOUNTER — Telehealth: Payer: Self-pay | Admitting: Plastic Surgery

## 2022-05-15 ENCOUNTER — Other Ambulatory Visit: Payer: Self-pay

## 2022-05-15 ENCOUNTER — Ambulatory Visit (INDEPENDENT_AMBULATORY_CARE_PROVIDER_SITE_OTHER): Payer: BC Managed Care – PPO | Admitting: Physician Assistant

## 2022-05-15 ENCOUNTER — Encounter (HOSPITAL_COMMUNITY): Payer: Self-pay | Admitting: Plastic Surgery

## 2022-05-15 ENCOUNTER — Inpatient Hospital Stay (HOSPITAL_COMMUNITY)
Admission: AD | Admit: 2022-05-15 | Discharge: 2022-05-20 | DRG: 909 | Disposition: A | Discharge status: Home / Self Care | Payer: BC Managed Care – PPO | Source: Ambulatory Visit | Attending: Plastic Surgery | Admitting: Plastic Surgery

## 2022-05-15 ENCOUNTER — Telehealth: Payer: Self-pay | Admitting: *Deleted

## 2022-05-15 VITALS — BP 126/82 | HR 95 | Temp 97.4°F

## 2022-05-15 DIAGNOSIS — Z9012 Acquired absence of left breast and nipple: Secondary | ICD-10-CM | POA: Diagnosis not present

## 2022-05-15 DIAGNOSIS — Y838 Other surgical procedures as the cause of abnormal reaction of the patient, or of later complication, without mention of misadventure at the time of the procedure: Secondary | ICD-10-CM | POA: Diagnosis present

## 2022-05-15 DIAGNOSIS — Z9889 Other specified postprocedural states: Secondary | ICD-10-CM

## 2022-05-15 DIAGNOSIS — B999 Unspecified infectious disease: Principal | ICD-10-CM | POA: Diagnosis present

## 2022-05-15 DIAGNOSIS — I10 Essential (primary) hypertension: Secondary | ICD-10-CM | POA: Diagnosis not present

## 2022-05-15 DIAGNOSIS — N61 Mastitis without abscess: Secondary | ICD-10-CM | POA: Diagnosis present

## 2022-05-15 DIAGNOSIS — Z853 Personal history of malignant neoplasm of breast: Secondary | ICD-10-CM | POA: Diagnosis not present

## 2022-05-15 DIAGNOSIS — Z91018 Allergy to other foods: Secondary | ICD-10-CM

## 2022-05-15 DIAGNOSIS — R5381 Other malaise: Secondary | ICD-10-CM | POA: Diagnosis not present

## 2022-05-15 DIAGNOSIS — T8579XA Infection and inflammatory reaction due to other internal prosthetic devices, implants and grafts, initial encounter: Secondary | ICD-10-CM | POA: Diagnosis not present

## 2022-05-15 DIAGNOSIS — D649 Anemia, unspecified: Secondary | ICD-10-CM | POA: Diagnosis not present

## 2022-05-15 DIAGNOSIS — Z9049 Acquired absence of other specified parts of digestive tract: Secondary | ICD-10-CM | POA: Diagnosis not present

## 2022-05-15 MED ORDER — ONDANSETRON HCL 4 MG/2ML IJ SOLN: 4.0000 mg | Freq: Four times a day (QID) | INTRAMUSCULAR | Status: DC | PRN

## 2022-05-15 MED ORDER — ACETAMINOPHEN 500 MG PO TABS
500.0000 mg | ORAL_TABLET | ORAL | Status: DC | PRN
  Administered 2022-05-16 – 2022-05-19 (×4): 500 mg via ORAL
  Filled 2022-05-15 (×4): qty 1

## 2022-05-15 MED ORDER — CHLORHEXIDINE GLUCONATE CLOTH 2 % EX PADS
6.0000 | MEDICATED_PAD | Freq: Once | CUTANEOUS | Status: AC
  Administered 2022-05-16: 6 via TOPICAL

## 2022-05-15 MED ORDER — DOXYCYCLINE HYCLATE 100 MG PO TABS: 100.0000 mg | ORAL_TABLET | Freq: Two times a day (BID) | ORAL | 0 refills | Status: DC

## 2022-05-15 MED ORDER — PIPERACILLIN-TAZOBACTAM 3.375 G IVPB
3.3750 g | Freq: Three times a day (TID) | INTRAVENOUS | Status: DC
  Administered 2022-05-16 – 2022-05-20 (×14): 3.375 g via INTRAVENOUS
  Filled 2022-05-15 (×14): qty 50

## 2022-05-15 MED ORDER — OXYCODONE HCL 5 MG PO TABS
5.0000 mg | ORAL_TABLET | ORAL | Status: DC | PRN
  Administered 2022-05-16 – 2022-05-20 (×15): 10 mg via ORAL
  Filled 2022-05-15 (×15): qty 2

## 2022-05-15 MED ORDER — VANCOMYCIN HCL IN DEXTROSE 1-5 GM/200ML-% IV SOLN
1000.0000 mg | INTRAVENOUS | Status: AC
  Administered 2022-05-16: 1000 mg via INTRAVENOUS
  Filled 2022-05-15: qty 200

## 2022-05-15 MED ORDER — MUPIROCIN 2 % EX OINT
1.0000 | TOPICAL_OINTMENT | Freq: Two times a day (BID) | CUTANEOUS | Status: DC
  Administered 2022-05-16 – 2022-05-17 (×3): 1 via NASAL
  Filled 2022-05-15: qty 22

## 2022-05-15 MED ORDER — HYDROMORPHONE HCL 1 MG/ML IJ SOLN
1.0000 mg | INTRAMUSCULAR | Status: DC | PRN
  Administered 2022-05-16 – 2022-05-17 (×2): 1 mg via INTRAVENOUS
  Filled 2022-05-15 (×3): qty 1

## 2022-05-15 MED ORDER — CHLORHEXIDINE GLUCONATE CLOTH 2 % EX PADS
6.0000 | MEDICATED_PAD | Freq: Once | CUTANEOUS | Status: AC
Start: 2022-05-15 — End: 2022-05-15
  Administered 2022-05-15: 6 via TOPICAL

## 2022-05-15 MED ORDER — OXYCODONE HCL 5 MG PO TABS: 5.0000 mg | ORAL_TABLET | Freq: Four times a day (QID) | ORAL | 0 refills | Status: DC | PRN

## 2022-05-15 MED ORDER — PIPERACILLIN-TAZOBACTAM 3.375 G IVPB
3.3750 g | Freq: Four times a day (QID) | INTRAVENOUS | Status: DC
Start: 2022-05-16 — End: 2022-05-15

## 2022-05-15 MED ORDER — SODIUM CHLORIDE 0.9 % IV SOLN: INTRAVENOUS | Status: DC

## 2022-05-15 NOTE — Progress Notes (Signed)
Spoke with pt and her son, Islam for pre-op call. Pt denies any cardiac history. Pt is not diabetic. She is treated for HTN.   Shower instructions given to pt., she voiced understanding.   Instructed them to register in the Emergency Department and once she is registered they will escort them to Surgical Short Stay.

## 2022-05-15 NOTE — Telephone Encounter (Signed)
Call placed to [facility]Patient Placement for Dr. Erin Hearing for direct admit.  Information given to Robin:  Admitting MD - Erin Hearing, OBS Dx: left breast infection Patient waiting at home  Contact numbers provided : [phone]; [phone]; [phone]

## 2022-05-15 NOTE — Progress Notes (Cosign Needed Addendum)
Patient is a 51 year old female with PMH of left-sided DCIS s/p left-sided mastectomy with immediate reconstruction using tissue expander Flex HD performed 04/22/2022 by Dr. Marla Roe who presents to clinic for postoperative follow-up.  She was last seen here in clinic on 05/12/2022.  At that time, she was accompanied by her son at bedside.  She had putting out very little from her drain.  Exam was reassuring, only minimal swelling appreciated.  Honeycomb dressing was removed along with left-sided drain.  However, when expander fill was attempted, patient sustained a vasovagal episode and decision was made to hold off on fill until subsequent visit.  She was feeling improved by end of visit.  Current volume is 220/535 cc.   Today, patient is accompanied by her son and husband at bedside.  She reports that since she was last seen here in clinic, she has had considerable pain and swelling.  She has been having chills and hot flashes for which she has been taking antipyretics.  No objective fevers at home.  Vital signs here within normal limits aside from mildly elevated heart rate at 100.  Patient notes that her pain symptoms had previously been well controlled, but now she is requiring them regularly and it is still not alleviating her symptoms.  She is hoping that her expander can be extracted and tells me that she is no longer interested in reconstruction.  On physical exam, left breast is considerably swollen and erythematous.  Firm.  Tender to the touch.  No wound dehiscence or areas of drainage.  Dr. Erin Hearing also personally evaluated patient.  He then reached out to Dr. Marla Roe as patient like to be admitted for pain control as well as getting the implant out as soon as possible.  While vital signs here today are largely reassuring, she does complain of some systemic symptoms at home and she would benefit from IV antibiotics.  She would also benefit from IV analgesics until expander removal with washout  can be performed.  We will attempt for direct outpatient to hospital admission, Dr. Erin Hearing will be admitting physician.  We will prescribe doxycycline and oxycodone for her to take until she can be admitted to the hospital, possibly this evening if not tomorrow.  NPO after midnight.  Addendum: Per Dr. Erin Hearing, patient to go to ER at 6:15 AM tomorrow (Saturday July 1), and tell the check-in secretary that she is there for planned surgery with Dr. Erin Hearing at which point she would be brought to short-stay and pre-op area.  Admission orders are pending, but no available beds at this time.  This has all  been communicated to patient who voices understanding and is agreeable.  Will remain NPO after midnight.

## 2022-05-15 NOTE — H&P (Signed)
Reason for Consult/CC:infected left breast tissue expander  Yvette Franklin is an 51 y.o. female.  HPI: Patient is a 51 year old underwent tissue expander reconstruction on 04/22/2022 with Dr. Marla Roe.  She had an uneventful early course but then developed increased pain and redness of the left reconstructed breast in the last 2 days.  He has been afebrile objectively but has had chills and feels some malaise.  Her vital signs of been stable throughout her course and she has not been feeling well.  She is very adamant she would like to have her tissue expander removed on the left side.  We discussed that it would be an option to try antibiotics to salvage the tissue expander but she would like it removed as soon as possible.  I discussed this with her operating surgeon Dr. Marla Roe and she agreed with the plan as long as the patient is certain of her goals.  Allergies: No Known Allergies  Medications:  Current Facility-Administered Medications:    [START ON 05/16/2022] 0.9 %  sodium chloride infusion, , Intravenous, Continuous, Green, Rowe Clack, PA-C   acetaminophen (TYLENOL) tablet 500 mg, 500 mg, Oral, Q4H PRN, Corena Herter, PA-C   ondansetron (ZOFRAN) injection 4 mg, 4 mg, Intravenous, Q6H PRN, Krista Blue L, PA-C   oxyCODONE (Oxy IR/ROXICODONE) immediate release tablet 5-10 mg, 5-10 mg, Oral, Q4H PRN, Corena Herter, PA-C  Past Medical History:  Diagnosis Date   Breast cancer ([place])    left breast DCIS   Complication of anesthesia    Family history of GYN cancer 02/20/2022   Hypertension    PONV (postoperative nausea and vomiting)     Past Surgical History:  Procedure Laterality Date   BREAST BIOPSY Left 02/09/2022   BREAST BIOPSY Right 02/18/2022   BREAST RECONSTRUCTION WITH PLACEMENT OF TISSUE EXPANDER AND ALLODERM Left 04/22/2022   Procedure: BREAST RECONSTRUCTION WITH PLACEMENT OF TISSUE EXPANDER AND FLEX HD;  Surgeon: Wallace Going, DO;  Location: [facility];  Service: Plastics;  Laterality: Left;   CHOLECYSTECTOMY     MASTECTOMY W/ SENTINEL NODE BIOPSY Left 04/22/2022   Procedure: LEFT MASTECTOMY WITH SENTINEL LYMPH NODE BIOPSY;  Surgeon: Jovita Kussmaul, MD;  Location: [facility];  Service: General;  Laterality: Left;    Family History  Problem Relation Age of Onset   Healthy Mother    Healthy Father    Cancer Maternal Aunt        GYN; ? ovarian   Cancer Cousin        unknown type; dx 61s   Breast cancer Neg Hx     Social History:  reports that she has never smoked. She has never used smokeless tobacco. She reports that she does not drink alcohol and does not use drugs.  Physical Exam Blood pressure 117/68, pulse 97, temperature 98.6 F (37 C), temperature source Oral, resp. rate 17, SpO2 99 %. General: Alert, no acute distress Breast: Left breast with significant induration and erythema.  No clear undrained fluid collection.  No results found for this or any previous visit (from the past 48 hour(s)).  No results found.  Assessment/Plan: Based on the patient goals I think the best option is to remove the tissue expander urgently.  She will be made n.p.o. and we will plan for the operating room in the morning pending availability.  We will start IV antibiotics to treat her cellulitis.  Lennice Sites 05/15/2022, 10:46 PM

## 2022-05-15 NOTE — Telephone Encounter (Signed)
Called patient's insurance and spoke with CSR Melissa. Checked authorization requirements for CPT [phone] for surgery scheduled for tomorrow. NO PA is REQUIRED as outpatient. Melissa confirmed that if patient ends up being admitted, the hospital team would need to authorize the admission at that time. Call Reference # [id number]

## 2022-05-16 ENCOUNTER — Encounter (HOSPITAL_COMMUNITY): Payer: Self-pay | Admitting: Plastic Surgery

## 2022-05-16 ENCOUNTER — Observation Stay (HOSPITAL_COMMUNITY): Payer: BC Managed Care – PPO | Admitting: Anesthesiology

## 2022-05-16 ENCOUNTER — Ambulatory Visit (HOSPITAL_COMMUNITY): Admission: RE | Admit: 2022-05-16 | Payer: BC Managed Care – PPO | Source: Home / Self Care | Admitting: Plastic Surgery

## 2022-05-16 ENCOUNTER — Encounter (HOSPITAL_COMMUNITY)
Admission: AD | Disposition: A | Discharge status: Home / Self Care | Payer: Self-pay | Source: Ambulatory Visit | Attending: Plastic Surgery

## 2022-05-16 ENCOUNTER — Other Ambulatory Visit: Payer: Self-pay

## 2022-05-16 DIAGNOSIS — T8579XA Infection and inflammatory reaction due to other internal prosthetic devices, implants and grafts, initial encounter: Principal | ICD-10-CM

## 2022-05-16 DIAGNOSIS — D649 Anemia, unspecified: Secondary | ICD-10-CM | POA: Diagnosis not present

## 2022-05-16 DIAGNOSIS — I10 Essential (primary) hypertension: Secondary | ICD-10-CM | POA: Diagnosis not present

## 2022-05-16 HISTORY — PX: REMOVAL OF TISSUE EXPANDER AND PLACEMENT OF IMPLANT: SHX6457

## 2022-05-16 HISTORY — PX: IRRIGATION AND DEBRIDEMENT ABDOMEN: SHX6600

## 2022-05-16 LAB — CBC WITH DIFFERENTIAL/PLATELET
Abs Immature Granulocytes: 0.04 10*3/uL (ref 0.00–0.07)
Basophils Absolute: 0.1 10*3/uL (ref 0.0–0.1)
Basophils Relative: 1 %
Eosinophils Absolute: 0.1 10*3/uL (ref 0.0–0.5)
Eosinophils Relative: 1 %
HCT: 35 % — ABNORMAL LOW (ref 36.0–46.0)
Hemoglobin: 11.7 g/dL — ABNORMAL LOW (ref 12.0–15.0)
Immature Granulocytes: 0 %
Lymphocytes Relative: 20 %
Lymphs Abs: 2.2 10*3/uL (ref 0.7–4.0)
MCH: 29.6 pg (ref 26.0–34.0)
MCHC: 33.4 g/dL (ref 30.0–36.0)
MCV: 88.6 fL (ref 80.0–100.0)
Monocytes Absolute: 0.9 10*3/uL (ref 0.1–1.0)
Monocytes Relative: 9 %
Neutro Abs: 7.6 10*3/uL (ref 1.7–7.7)
Neutrophils Relative %: 69 %
Platelets: 374 10*3/uL (ref 150–400)
RBC: 3.95 MIL/uL (ref 3.87–5.11)
RDW: 12.6 % (ref 11.5–15.5)
WBC: 10.9 10*3/uL — ABNORMAL HIGH (ref 4.0–10.5)
nRBC: 0 % (ref 0.0–0.2)

## 2022-05-16 LAB — COMPREHENSIVE METABOLIC PANEL
ALT: 64 U/L — ABNORMAL HIGH (ref 0–44)
AST: 63 U/L — ABNORMAL HIGH (ref 15–41)
Albumin: 2.7 g/dL — ABNORMAL LOW (ref 3.5–5.0)
Alkaline Phosphatase: 86 U/L (ref 38–126)
Anion gap: 9 (ref 5–15)
BUN: 9 mg/dL (ref 6–20)
CO2: 25 mmol/L (ref 22–32)
Calcium: 8.6 mg/dL — ABNORMAL LOW (ref 8.9–10.3)
Chloride: 104 mmol/L (ref 98–111)
Creatinine, Ser: 0.8 mg/dL (ref 0.44–1.00)
GFR, Estimated: >60 mL/min (ref >60)
Glucose, Bld: 114 mg/dL — ABNORMAL HIGH (ref 70–99)
Potassium: 3.3 mmol/L — ABNORMAL LOW (ref 3.5–5.1)
Sodium: 138 mmol/L (ref 135–145)
Total Bilirubin: 0.6 mg/dL (ref 0.3–1.2)
Total Protein: 6.3 g/dL — ABNORMAL LOW (ref 6.5–8.1)

## 2022-05-16 LAB — GLUCOSE, CAPILLARY: Glucose-Capillary: 121 mg/dL — ABNORMAL HIGH (ref 70–99)

## 2022-05-16 LAB — SURGICAL PCR SCREEN
MRSA, PCR: NEGATIVE
Staphylococcus aureus: NEGATIVE

## 2022-05-16 LAB — MAGNESIUM: Magnesium: 2 mg/dL (ref 1.7–2.4)

## 2022-05-16 SURGERY — REMOVAL, TISSUE EXPANDER, BREAST, WITH IMPLANT INSERTION
Anesthesia: General | Site: Breast | Laterality: Left

## 2022-05-16 MED ORDER — GENTAMICIN SULFATE 40 MG/ML IJ SOLN
INTRAMUSCULAR | Status: AC
  Filled 2022-05-16: qty 2

## 2022-05-16 MED ORDER — PROPOFOL 10 MG/ML IV BOLUS
INTRAVENOUS | Status: DC | PRN
  Administered 2022-05-16: 200 mg via INTRAVENOUS

## 2022-05-16 MED ORDER — LIDOCAINE-EPINEPHRINE 1 %-1:100000 IJ SOLN
INTRAMUSCULAR | Status: AC
  Filled 2022-05-16: qty 1

## 2022-05-16 MED ORDER — LIDOCAINE 2% (20 MG/ML) 5 ML SYRINGE
INTRAMUSCULAR | Status: AC
  Filled 2022-05-16: qty 5

## 2022-05-16 MED ORDER — SODIUM CHLORIDE 0.9 % IR SOLN
Status: DC | PRN
  Administered 2022-05-16: 3000 mL

## 2022-05-16 MED ORDER — FENTANYL CITRATE (PF) 250 MCG/5ML IJ SOLN
INTRAMUSCULAR | Status: AC
  Filled 2022-05-16: qty 5

## 2022-05-16 MED ORDER — FENTANYL CITRATE (PF) 100 MCG/2ML IJ SOLN: 25.0000 ug | INTRAMUSCULAR | Status: DC | PRN

## 2022-05-16 MED ORDER — VANCOMYCIN HCL 500 MG IV SOLR
INTRAVENOUS | Status: AC
  Filled 2022-05-16: qty 10

## 2022-05-16 MED ORDER — ORAL CARE MOUTH RINSE: 15.0000 mL | Freq: Once | OROMUCOSAL | Status: AC

## 2022-05-16 MED ORDER — PROPOFOL 10 MG/ML IV BOLUS
INTRAVENOUS | Status: AC
  Filled 2022-05-16: qty 20

## 2022-05-16 MED ORDER — DEXAMETHASONE SODIUM PHOSPHATE 10 MG/ML IJ SOLN
INTRAMUSCULAR | Status: AC
  Filled 2022-05-16: qty 1

## 2022-05-16 MED ORDER — LACTATED RINGERS IV SOLN
INTRAVENOUS | Status: DC
Start: 2022-05-16 — End: 2022-05-16

## 2022-05-16 MED ORDER — CHLORHEXIDINE GLUCONATE 0.12 % MT SOLN
15.0000 mL | Freq: Once | OROMUCOSAL | Status: AC
Start: 2022-05-16 — End: 2022-05-16
  Administered 2022-05-16: 15 mL via OROMUCOSAL
  Filled 2022-05-16: qty 15

## 2022-05-16 MED ORDER — 0.9 % SODIUM CHLORIDE (POUR BTL) OPTIME
TOPICAL | Status: DC | PRN
  Administered 2022-05-16: 1000 mL

## 2022-05-16 MED ORDER — LIDOCAINE-EPINEPHRINE 1 %-1:100000 IJ SOLN
INTRAMUSCULAR | Status: DC | PRN
  Administered 2022-05-16: 6 mL

## 2022-05-16 MED ORDER — SODIUM CHLORIDE 0.9 % IV SOLN
INTRAVENOUS | Status: DC | PRN
  Administered 2022-05-16: 80 mg

## 2022-05-16 MED ORDER — PROMETHAZINE HCL 25 MG/ML IJ SOLN: 6.2500 mg | INTRAMUSCULAR | Status: DC | PRN

## 2022-05-16 MED ORDER — POLYETHYLENE GLYCOL 3350 17 G PO PACK
17.0000 g | PACK | Freq: Every day | ORAL | Status: DC | PRN
  Administered 2022-05-17: 17 g via ORAL
  Filled 2022-05-16: qty 1

## 2022-05-16 MED ORDER — ACETAMINOPHEN 10 MG/ML IV SOLN
INTRAVENOUS | Status: DC | PRN
  Administered 2022-05-16: 1000 mg via INTRAVENOUS

## 2022-05-16 MED ORDER — ONDANSETRON HCL 4 MG/2ML IJ SOLN
INTRAMUSCULAR | Status: AC
  Filled 2022-05-16: qty 2

## 2022-05-16 MED ORDER — VANCOMYCIN HCL 500 MG IV SOLR
INTRAVENOUS | Status: DC | PRN
  Administered 2022-05-16: 500 mg

## 2022-05-16 MED ORDER — AMISULPRIDE (ANTIEMETIC) 5 MG/2ML IV SOLN: 10.0000 mg | Freq: Once | INTRAVENOUS | Status: DC | PRN

## 2022-05-16 MED ORDER — KETOROLAC TROMETHAMINE 30 MG/ML IJ SOLN: 30.0000 mg | Freq: Once | INTRAMUSCULAR | Status: DC

## 2022-05-16 MED ORDER — ONDANSETRON HCL 4 MG/2ML IJ SOLN
INTRAMUSCULAR | Status: DC | PRN
  Administered 2022-05-16: 4 mg via INTRAVENOUS

## 2022-05-16 MED ORDER — ACETAMINOPHEN 10 MG/ML IV SOLN
1000.0000 mg | Freq: Once | INTRAVENOUS | Status: DC | PRN
Start: 2022-05-16 — End: 2022-05-16

## 2022-05-16 MED ORDER — BACITRACIN ZINC 500 UNIT/GM EX OINT
TOPICAL_OINTMENT | CUTANEOUS | Status: AC
  Filled 2022-05-16: qty 28.35

## 2022-05-16 MED ORDER — OXYCODONE HCL 5 MG/5ML PO SOLN: 5.0000 mg | Freq: Once | ORAL | Status: DC | PRN

## 2022-05-16 MED ORDER — LIDOCAINE HCL (CARDIAC) PF 100 MG/5ML IV SOSY
PREFILLED_SYRINGE | INTRAVENOUS | Status: DC | PRN
  Administered 2022-05-16: 60 mg via INTRATRACHEAL

## 2022-05-16 MED ORDER — OXYCODONE HCL 5 MG PO TABS: 5.0000 mg | ORAL_TABLET | Freq: Once | ORAL | Status: DC | PRN

## 2022-05-16 MED ORDER — ACETAMINOPHEN 10 MG/ML IV SOLN
INTRAVENOUS | Status: AC
  Filled 2022-05-16: qty 100

## 2022-05-16 MED ORDER — MIDAZOLAM HCL 2 MG/2ML IJ SOLN
INTRAMUSCULAR | Status: AC
  Filled 2022-05-16: qty 2

## 2022-05-16 MED ORDER — MIDAZOLAM HCL 2 MG/2ML IJ SOLN
INTRAMUSCULAR | Status: DC | PRN
  Administered 2022-05-16: 2 mg via INTRAVENOUS

## 2022-05-16 MED ORDER — PHENYLEPHRINE 80 MCG/ML (10ML) SYRINGE FOR IV PUSH (FOR BLOOD PRESSURE SUPPORT)
PREFILLED_SYRINGE | INTRAVENOUS | Status: DC | PRN
  Administered 2022-05-16: 160 ug via INTRAVENOUS
  Administered 2022-05-16: 80 ug via INTRAVENOUS
  Administered 2022-05-16 (×6): 160 ug via INTRAVENOUS
  Administered 2022-05-16: 80 ug via INTRAVENOUS

## 2022-05-16 MED ORDER — FENTANYL CITRATE (PF) 250 MCG/5ML IJ SOLN
INTRAMUSCULAR | Status: DC | PRN
  Administered 2022-05-16: 50 ug via INTRAVENOUS
  Administered 2022-05-16 (×2): 25 ug via INTRAVENOUS

## 2022-05-16 MED ORDER — SODIUM CHLORIDE 0.9 % IV BOLUS
1000.0000 mL | Freq: Once | INTRAVENOUS | Status: AC
  Administered 2022-05-16: 1000 mL via INTRAVENOUS

## 2022-05-16 MED ORDER — DEXAMETHASONE SODIUM PHOSPHATE 10 MG/ML IJ SOLN
INTRAMUSCULAR | Status: DC | PRN
  Administered 2022-05-16: 5 mg via INTRAVENOUS

## 2022-05-16 MED ORDER — POTASSIUM CHLORIDE CRYS ER 20 MEQ PO TBCR
40.0000 meq | EXTENDED_RELEASE_TABLET | Freq: Once | ORAL | Status: AC
  Administered 2022-05-16: 40 meq via ORAL
  Filled 2022-05-16: qty 2

## 2022-05-16 MED ORDER — HEMOSTATIC AGENTS (NO CHARGE) OPTIME
TOPICAL | Status: DC | PRN
  Administered 2022-05-16: 1 via TOPICAL

## 2022-05-16 MED ORDER — EPHEDRINE SULFATE-NACL 50-0.9 MG/10ML-% IV SOSY
PREFILLED_SYRINGE | INTRAVENOUS | Status: DC | PRN
  Administered 2022-05-16: 5 mg via INTRAVENOUS
  Administered 2022-05-16: 2.5 mg via INTRAVENOUS
  Administered 2022-05-16: 5 mg via INTRAVENOUS
  Administered 2022-05-16: 2.5 mg via INTRAVENOUS

## 2022-05-16 SURGICAL SUPPLY — 55 items
BAG COUNTER SPONGE SURGICOUNT (BAG) ×2 IMPLANT
BAG SPNG CNTER NS LX DISP (BAG) ×1
BINDER BREAST XXLRG (GAUZE/BANDAGES/DRESSINGS) ×1 IMPLANT
BIOPATCH RED 1 DISK 7.0 (GAUZE/BANDAGES/DRESSINGS) IMPLANT
BNDG ELASTIC 6X5.8 VLCR STR LF (GAUZE/BANDAGES/DRESSINGS) IMPLANT
CANISTER SUCT 3000ML PPV (MISCELLANEOUS) ×3 IMPLANT
COVER SURGICAL LIGHT HANDLE (MISCELLANEOUS) ×3 IMPLANT
DRAIN CHANNEL 19F RND (DRAIN) IMPLANT
DRAPE ORTHO SPLIT 77X108 STRL (DRAPES) ×4
DRAPE SURG ORHT 6 SPLT 77X108 (DRAPES) ×4 IMPLANT
DRESSING QUICKCLOT CNTRL 12X12 (GAUZE/BANDAGES/DRESSINGS) IMPLANT
DRSG MEPILEX BORDER 4X8 (GAUZE/BANDAGES/DRESSINGS) ×1 IMPLANT
DRSG PAD ABDOMINAL 8X10 ST (GAUZE/BANDAGES/DRESSINGS) IMPLANT
DRSG QUICKCLOT CNTRL 12X12 (GAUZE/BANDAGES/DRESSINGS) ×2
ELECT BLADE 6.5 EXT (BLADE) ×1 IMPLANT
ELECT REM PT RETURN 9FT ADLT (ELECTROSURGICAL) ×2
ELECTRODE REM PT RTRN 9FT ADLT (ELECTROSURGICAL) ×2 IMPLANT
EVACUATOR SILICONE 100CC (DRAIN) ×1 IMPLANT
GAUZE SPONGE 4X4 12PLY STRL (GAUZE/BANDAGES/DRESSINGS) IMPLANT
GLOVE BIO SURGEON STRL SZ 6.5 (GLOVE) ×6 IMPLANT
GLOVE BIOGEL M STRL SZ7.5 (GLOVE) ×1 IMPLANT
GLOVE INDICATOR 8.0 STRL GRN (GLOVE) ×1 IMPLANT
GOWN STRL REUS W/ TWL LRG LVL3 (GOWN DISPOSABLE) ×6 IMPLANT
GOWN STRL REUS W/TWL LRG LVL3 (GOWN DISPOSABLE) ×4
HANDPIECE INTERPULSE COAX TIP (DISPOSABLE) ×2
KIT BASIN OR (CUSTOM PROCEDURE TRAY) ×3 IMPLANT
KIT TURNOVER KIT B (KITS) ×3 IMPLANT
MARKER SKIN DUAL TIP RULER LAB (MISCELLANEOUS) ×2 IMPLANT
NDL HYPO 18GX1.5 BLUNT FILL (NEEDLE) IMPLANT
NDL HYPO 25GX1X1/2 BEV (NEEDLE) ×1 IMPLANT
NEEDLE HYPO 18GX1.5 BLUNT FILL (NEEDLE) ×2 IMPLANT
NEEDLE HYPO 25GX1X1/2 BEV (NEEDLE) ×2 IMPLANT
NS IRRIG 1000ML POUR BTL (IV SOLUTION) ×2 IMPLANT
PACK GENERAL/GYN (CUSTOM PROCEDURE TRAY) ×2 IMPLANT
PAD ARMBOARD 7.5X6 YLW CONV (MISCELLANEOUS) ×6 IMPLANT
PENCIL SMOKE EVACUATOR (MISCELLANEOUS) ×2 IMPLANT
SET HNDPC FAN SPRY TIP SCT (DISPOSABLE) IMPLANT
SOLUTION BETADINE 4OZ (MISCELLANEOUS) ×3 IMPLANT
STAPLER VISISTAT 35W (STAPLE) IMPLANT
SUT MNCRL AB 4-0 PS2 18 (SUTURE) ×8 IMPLANT
SUT MON AB 3-0 SH 27 (SUTURE)
SUT MON AB 3-0 SH27 (SUTURE) ×2 IMPLANT
SUT MON AB 5-0 PS2 18 (SUTURE) ×2 IMPLANT
SUT PDS AB 3-0 SH 27 (SUTURE) ×4 IMPLANT
SUT PROLENE 3 0 PS 2 (SUTURE) ×5 IMPLANT
SUT PROLENE 4 0 PS 2 18 (SUTURE) ×1 IMPLANT
SUT SILK 2 0 PERMA HAND 18 BK (SUTURE) ×1 IMPLANT
SUT VIC AB 3-0 SH 18 (SUTURE) ×3 IMPLANT
SWAB COLLECTION DEVICE MRSA (MISCELLANEOUS) ×1 IMPLANT
SWAB CULTURE ESWAB REG 1ML (MISCELLANEOUS) ×1 IMPLANT
SYR 10ML LL (SYRINGE) ×1 IMPLANT
SYR CONTROL 10ML LL (SYRINGE) ×3 IMPLANT
TOWEL GREEN STERILE (TOWEL DISPOSABLE) ×3 IMPLANT
TOWEL GREEN STERILE FF (TOWEL DISPOSABLE) ×2 IMPLANT
WATER STERILE IRR 1000ML POUR (IV SOLUTION) IMPLANT

## 2022-05-16 NOTE — Anesthesia Postprocedure Evaluation (Signed)
Anesthesia Post Note  Patient: Yvette Franklin  Procedure(s) Performed: REMOVAL OF TISSUE EXPANDER (Left: Breast) IRRIGATION AND DEBRIDEMENT OF LEFT BREAST POCKET (Left: Breast)     Patient location during evaluation: PACU Anesthesia Type: General Level of consciousness: awake Pain management: pain level controlled Vital Signs Assessment: post-procedure vital signs reviewed and stable Respiratory status: spontaneous breathing, nonlabored ventilation, respiratory function stable and patient connected to nasal cannula oxygen Cardiovascular status: blood pressure returned to baseline and stable Postop Assessment: no apparent nausea or vomiting Anesthetic complications: no   No notable events documented.  Last Vitals:  Vitals:   05/16/22 0950 05/16/22 1011  BP: 100/61 115/69  Pulse:  98  Resp: 13 16  Temp: 36.7 C 36.7 C  SpO2: 94% 91%    Last Pain:  Vitals:   05/16/22 1037  TempSrc:   PainSc: 8                  Yvette Franklin P [name]

## 2022-05-16 NOTE — Progress Notes (Signed)
    05/15/22 2352  Assess: MEWS Score  Temp (!) 102.6 F (39.2 C)  BP 114/64  MAP (mmHg) 78  Pulse Rate 99  Resp 18  SpO2 99 %  O2 Device Room Air  Assess: MEWS Score  MEWS Temp 2  MEWS Systolic 0  MEWS Pulse 0  MEWS RR 0  MEWS LOC 0  MEWS Score 2  MEWS Score Color Yellow  Assess: if the MEWS score is Yellow or Red  Were vital signs taken at a resting state? Yes  Focused Assessment No change from prior assessment  Does the patient meet 2 or more of the SIRS criteria? Yes  Does the patient have a confirmed or suspected source of infection? Yes  Provider and Rapid Response Notified? Yes  MEWS guidelines implemented *See Row Information* Yes  Treat  MEWS Interventions Escalated (See documentation below)  Pain Scale 0-10  Pain Score 0  Take Vital Signs  Increase Vital Sign Frequency  Yellow: Q 2hr X 2 then Q 4hr X 2, if remains yellow, continue Q 4hrs  Escalate  MEWS: Escalate Yellow: discuss with charge nurse/RN and consider discussing with provider and RRT  Notify: Charge Nurse/RN  Name of Charge Nurse/RN Notified [place], RN  Date Charge Nurse/RN Notified 05/16/22  Time Charge Nurse/RN Notified 0005  Notify: Provider  Provider Name/Title Krista Blue, PA  Date Provider Notified 05/16/22  Time Provider Notified 0008  Method of Notification Call  Notification Reason Change in status (Temparature 102.74F, Yellow MEWS)  Provider response See new orders  Date of Provider Response 05/16/22  Time of Provider Response 0008  Assess: SIRS CRITERIA  SIRS Temperature  1  SIRS Pulse 1  SIRS Respirations  0  SIRS WBC 0  SIRS Score Sum  2

## 2022-05-16 NOTE — Anesthesia Preprocedure Evaluation (Signed)
Anesthesia Evaluation  Patient identified by MRN, date of birth, ID band Patient awake    Reviewed: Allergy & Precautions, NPO status , Patient's Chart, lab work & pertinent test results  Airway Mallampati: II  TM Distance: >3 FB Neck ROM: Full    Dental no notable dental hx.    Pulmonary neg pulmonary ROS,    Pulmonary exam normal        Cardiovascular hypertension, Pt. on medications Normal cardiovascular exam     Neuro/Psych  Headaches, PSYCHIATRIC DISORDERS Depression    GI/Hepatic negative GI ROS, Neg liver ROS,   Endo/Other  negative endocrine ROS  Renal/GU negative Renal ROS     Musculoskeletal negative musculoskeletal ROS (+)   Abdominal (+) + obese,   Peds  Hematology  (+) Blood dyscrasia, anemia ,   Anesthesia Other Findings Left Sided Breast pain  Reproductive/Obstetrics                             Anesthesia Physical Anesthesia Plan  ASA: 2  Anesthesia Plan: General   Post-op Pain Management:    Induction: Intravenous  PONV Risk Score and Plan: 3 and Ondansetron, Dexamethasone, Midazolam and Treatment may vary due to age or medical condition  Airway Management Planned: LMA  Additional Equipment:   Intra-op Plan:   Post-operative Plan: Extubation in OR  Informed Consent: I have reviewed the patients History and Physical, chart, labs and discussed the procedure including the risks, benefits and alternatives for the proposed anesthesia with the patient or authorized representative who has indicated his/her understanding and acceptance.     Dental advisory given  Plan Discussed with: CRNA  Anesthesia Plan Comments:         Anesthesia Quick Evaluation

## 2022-05-16 NOTE — Progress Notes (Signed)
   05/16/22 0206  Assess: MEWS Score  Temp (!) 102.7 F (39.3 C)  BP (!) 89/41  MAP (mmHg) (!) 55  Pulse Rate (!) 109  Resp 18  SpO2 96 %  O2 Device Room Air  Assess: MEWS Score  MEWS Temp 2  MEWS Systolic 1  MEWS Pulse 1  MEWS RR 0  MEWS LOC 0  MEWS Score 4  MEWS Score Color Red  Assess: if the MEWS score is Yellow or Red  Were vital signs taken at a resting state? Yes  Focused Assessment No change from prior assessment  Does the patient meet 2 or more of the SIRS criteria? Yes  Does the patient have a confirmed or suspected source of infection? Yes  Provider and Rapid Response Notified? Yes  MEWS guidelines implemented *See Row Information* Yes  Treat  MEWS Interventions Escalated (See documentation below)  Pain Scale 0-10  Pain Score 4  Pain Type Acute pain  Pain Location Breast  Pain Orientation Left  Pain Descriptors / Indicators Tightness  Pain Frequency Intermittent  Pain Onset Gradual  Pain Intervention(s) Cold applied  Take Vital Signs  Increase Vital Sign Frequency  Red: Q 1hr X 4 then Q 4hr X 4, if remains red, continue Q 4hrs  Escalate  MEWS: Escalate Red: discuss with charge nurse/RN and provider, consider discussing with RRT  Notify: Charge Nurse/RN  Name of Charge Nurse/RN Notified [place], RN  Date Charge Nurse/RN Notified 05/16/22  Time Charge Nurse/RN Notified 0208  Notify: Provider  Provider Name/Title Krista Blue, PA  Date Provider Notified 05/16/22  Time Provider Notified 0210  Method of Notification Call  Notification Reason Change in status (Temp 102.7, BP soft 89/41 with MAP of 55, HR is 109)  Provider response See new orders;Other (Comment) (Inform Dri Luppens)  Date of Provider Response 05/16/22  Time of Provider Response 0215  Notify: Rapid Response  Name of Rapid Response RN Notified Waunita Schooner, RN  Date Rapid Response Notified 05/16/22  Time Rapid Response Notified 0229  Assess: SIRS CRITERIA  SIRS Temperature  1  SIRS Pulse 1   SIRS Respirations  0  SIRS WBC 0  SIRS Score Sum  2

## 2022-05-16 NOTE — Anesthesia Procedure Notes (Signed)
Procedure Name: LMA Insertion Date/Time: 05/16/2022 8:09 AM  Performed by: Rande Brunt, CRNAPre-anesthesia Checklist: Patient identified, Patient being monitored, Timeout performed, Emergency Drugs available and Suction available Patient Re-evaluated:Patient Re-evaluated prior to induction Oxygen Delivery Method: Circle system utilized Preoxygenation: Pre-oxygenation with 100% oxygen Induction Type: IV induction Ventilation: Mask ventilation without difficulty LMA: LMA inserted LMA Size: 3.0 Tube type: Oral Number of attempts: 1 Placement Confirmation: positive ETCO2 and breath sounds checked- equal and bilateral Tube secured with: Tape Dental Injury: Teeth and Oropharynx as per pre-operative assessment

## 2022-05-16 NOTE — Transfer of Care (Signed)
Immediate Anesthesia Transfer of Care Note  Patient: Yvette Franklin  Procedure(s) Performed: REMOVAL OF TISSUE EXPANDER (Left: Breast)  Patient Location: PACU  Anesthesia Type:General  Level of Consciousness: drowsy, patient cooperative and responds to stimulation  Airway & Oxygen Therapy: Patient Spontanous Breathing and Patient connected to face mask oxygen  Post-op Assessment: Report given to RN, Post -op Vital signs reviewed and stable and Patient moving all extremities X 4  Post vital signs: Reviewed and stable  Last Vitals:  Vitals Value Taken Time  BP 100/54 05/16/22 0920  Temp 36.5 C 05/16/22 0920  Pulse 89 05/16/22 0922  Resp 20 05/16/22 0922  SpO2 96 % 05/16/22 0922  Vitals shown include unvalidated device data.  Last Pain:  Vitals:   05/16/22 0606  TempSrc: Oral  PainSc:          Complications: No notable events documented.

## 2022-05-16 NOTE — Op Note (Signed)
Operative Note   DATE OF OPERATION: 05/16/2022  SURGICAL DEPARTMENT: Plastic Surgery  PREOPERATIVE DIAGNOSES: Infected left tissue expander  POSTOPERATIVE DIAGNOSES:  same  PROCEDURE:  1) Removal of left tissue expander 2) irrigation and debridement of left breast pocket including removal of majority of acellular dermal matrix.  SURGEON: Melene Plan. June Rode, MD  ASSISTANT: Krista Blue, PA  ANESTHESIA:  General.   COMPLICATIONS: None.   INDICATIONS FOR PROCEDURE:  The patient, Yvette Franklin is a 51 y.o. female born on 02-08-1971, is here for treatment of active left tissue expander.  The patient presented in in the office with erythema over the left reconstructed breast and was directly admitted.  In the hospital overnight she did have fevers and other signs of infection. MRN: [id number]  CONSENT:  Informed consent was obtained directly from the patient. Risks, benefits and alternatives were fully discussed. Specific risks including but not limited to bleeding, infection, hematoma, seroma, scarring, pain, contracture, asymmetry, wound healing problems, and need for further surgery were all discussed. The patient did have an ample opportunity to have questions answered to satisfaction.   DESCRIPTION OF PROCEDURE:  The patient was taken to the operating room. SCDs were placed and preop antibiotics were given. General anesthesia was administered.  The patient's operative site was prepped and draped in a sterile fashion. A time out was performed and all information was confirmed to be correct.    The left breast incision was opened through the previous incision.  Combination of sharp and blunt dissection was used to expose the Flex HD which was starting to incorporate well.  Significant straw colored fluid was expressed but no purulent fluid.  I opened the interface between the pectoral muscle and Flex HD and was able to remove the tissue expander without difficulty. The Flex HD that was  removed was sent for cultures including acid-fast.  The majority of Flex HD was removed.  Pulse lavage was used to irrigate the pocket with copious saline.  Following this we also irrigated with antibiotic irrigation.  Attention was turned towards hemostasis and the lighted retractor was used to visualize the pocket.  I placed a 47 [place] Blake drain which was dual plane above and below the pectoralis muscle.  After confirming hemostasis we closed the left breast wound in a layered fashion.  We used 3-0 PDS for dermal sutures and 4-0 Prolene for skin suture.  The advanced practice practitioner (APP) assisted throughout the case.  The APP was essential in retraction and counter traction when needed to make the case progress smoothly.  This retraction and assistance made it possible to see the tissue planes for the procedure.  The assistance was needed for hemostasis, tissue re-approximation and closure of the incision site.    The patient tolerated the procedure well.  There were no complications. The patient was allowed to wake from anesthesia, extubated and taken to the recovery room in satisfactory condition.

## 2022-05-16 NOTE — Interval H&P Note (Signed)
History and Physical Interval Note:  05/16/2022 7:36 AM  Yvette Franklin  has presented today for surgery, with the diagnosis of Left Sided Breast pain.  The various methods of treatment have been discussed with the patient and family. After consideration of risks, benefits and other options for treatment, the patient has consented to  Procedure(s): REMOVAL OF TISSUE EXPANDER (Left) as a surgical intervention.  The patient's history has been reviewed, patient examined, no change in status, stable for surgery.  I have reviewed the patient's chart and labs.  Questions were answered to the patient's satisfaction.     Lennice Sites

## 2022-05-17 ENCOUNTER — Encounter (HOSPITAL_COMMUNITY): Payer: Self-pay | Admitting: Plastic Surgery

## 2022-05-17 DIAGNOSIS — Y838 Other surgical procedures as the cause of abnormal reaction of the patient, or of later complication, without mention of misadventure at the time of the procedure: Secondary | ICD-10-CM | POA: Diagnosis present

## 2022-05-17 DIAGNOSIS — Z9012 Acquired absence of left breast and nipple: Secondary | ICD-10-CM | POA: Diagnosis not present

## 2022-05-17 DIAGNOSIS — Z853 Personal history of malignant neoplasm of breast: Secondary | ICD-10-CM | POA: Diagnosis not present

## 2022-05-17 DIAGNOSIS — I10 Essential (primary) hypertension: Secondary | ICD-10-CM | POA: Diagnosis present

## 2022-05-17 DIAGNOSIS — T8579XA Infection and inflammatory reaction due to other internal prosthetic devices, implants and grafts, initial encounter: Secondary | ICD-10-CM | POA: Diagnosis present

## 2022-05-17 DIAGNOSIS — Z9049 Acquired absence of other specified parts of digestive tract: Secondary | ICD-10-CM | POA: Diagnosis not present

## 2022-05-17 DIAGNOSIS — R5381 Other malaise: Secondary | ICD-10-CM | POA: Diagnosis present

## 2022-05-17 DIAGNOSIS — N61 Mastitis without abscess: Secondary | ICD-10-CM | POA: Diagnosis present

## 2022-05-17 DIAGNOSIS — Z91018 Allergy to other foods: Secondary | ICD-10-CM | POA: Diagnosis not present

## 2022-05-17 DIAGNOSIS — B999 Unspecified infectious disease: Principal | ICD-10-CM | POA: Diagnosis present

## 2022-05-17 LAB — COMPREHENSIVE METABOLIC PANEL
ALT: 54 U/L — ABNORMAL HIGH (ref 0–44)
AST: 38 U/L (ref 15–41)
Albumin: 2.2 g/dL — ABNORMAL LOW (ref 3.5–5.0)
Alkaline Phosphatase: 91 U/L (ref 38–126)
Anion gap: 9 (ref 5–15)
BUN: 9 mg/dL (ref 6–20)
CO2: 24 mmol/L (ref 22–32)
Calcium: 8.5 mg/dL — ABNORMAL LOW (ref 8.9–10.3)
Chloride: 107 mmol/L (ref 98–111)
Creatinine, Ser: 0.7 mg/dL (ref 0.44–1.00)
GFR, Estimated: >60 mL/min (ref >60)
Glucose, Bld: 140 mg/dL — ABNORMAL HIGH (ref 70–99)
Potassium: 4.3 mmol/L (ref 3.5–5.1)
Sodium: 140 mmol/L (ref 135–145)
Total Bilirubin: 0.5 mg/dL (ref 0.3–1.2)
Total Protein: 5.8 g/dL — ABNORMAL LOW (ref 6.5–8.1)

## 2022-05-17 LAB — CBC
HCT: 32.5 % — ABNORMAL LOW (ref 36.0–46.0)
Hemoglobin: 10.9 g/dL — ABNORMAL LOW (ref 12.0–15.0)
MCH: 30 pg (ref 26.0–34.0)
MCHC: 33.5 g/dL (ref 30.0–36.0)
MCV: 89.5 fL (ref 80.0–100.0)
Platelets: 328 10*3/uL (ref 150–400)
RBC: 3.63 MIL/uL — ABNORMAL LOW (ref 3.87–5.11)
RDW: 12.7 % (ref 11.5–15.5)
WBC: 11.9 10*3/uL — ABNORMAL HIGH (ref 4.0–10.5)
nRBC: 0 % (ref 0.0–0.2)

## 2022-05-17 MED ORDER — VANCOMYCIN HCL 2000 MG/400ML IV SOLN
2000.0000 mg | Freq: Once | INTRAVENOUS | Status: AC
  Administered 2022-05-17: 2000 mg via INTRAVENOUS
  Filled 2022-05-17: qty 400

## 2022-05-17 MED ORDER — VANCOMYCIN HCL 750 MG/150ML IV SOLN
750.0000 mg | Freq: Two times a day (BID) | INTRAVENOUS | Status: DC
  Administered 2022-05-18 – 2022-05-20 (×6): 750 mg via INTRAVENOUS
  Filled 2022-05-17 (×6): qty 150

## 2022-05-17 MED ORDER — VANCOMYCIN HCL IN DEXTROSE 1-5 GM/200ML-% IV SOLN: 1000.0000 mg | Freq: Two times a day (BID) | INTRAVENOUS | Status: DC

## 2022-05-17 MED ORDER — VANCOMYCIN HCL 750 MG/150ML IV SOLN
750.0000 mg | Freq: Two times a day (BID) | INTRAVENOUS | Status: DC
Start: 2022-05-18 — End: 2022-05-17

## 2022-05-17 NOTE — Plan of Care (Signed)
  Problem: Clinical Measurements: Goal: Will remain free from infection Outcome: Progressing   Problem: Clinical Measurements: Goal: Diagnostic test results will improve Outcome: Progressing   Problem: Nutrition: Goal: Adequate nutrition will be maintained Outcome: Progressing   Problem: Coping: Goal: Level of anxiety will decrease Outcome: Progressing   Problem: Pain Managment: Goal: General experience of comfort will improve Outcome: Progressing

## 2022-05-17 NOTE — Progress Notes (Signed)
Patient is postoperative day 1 status post removal of left tissue expander for infection.  She is feeling subjectively better.  PE Temp:  [97.6 F (36.4 C)-99 F (37.2 C)] 97.6 F (36.4 C) (07/02 0758) Pulse Rate:  [75-82] 75 (07/02 0758) Resp:  [16-18] 18 (07/02 0758) BP: (89-107)/(58-65) 92/59 (07/02 0758) SpO2:  [97 %-99 %] 99 % (07/02 0758)  Breast: Drain with light serous sanguinous output, still significant erythema over the breast.  Mepilex border dressing in place.  No evidence of undrained fluid collection or hematoma.  Assessment and plan Doing well postoperative day one.  She has been afebrile since removal of her tissue expander but her pressures have still been a little lower than usual since she is typically hypertensive and she still has significant erythema so would like to continue Vanco and Zosyn at this time.  We will reevaluate her tomorrow morning.

## 2022-05-17 NOTE — Progress Notes (Signed)
Pharmacy Antibiotic Note  Yvette Franklin is a 51 y.o. female admitted on 05/15/2022 with  intra-abdominal infection .  Pharmacy has been consulted for Vancomycin and Zosyn dosing.  Plan: Vancomycin 2000 mg IV x 1, followed by 750 mg IV q12h (SCr used 0.8, eAUC 512.6, goal AUC 400-550). Zosyn 3.375g IV q8h (4 hour infusion). Monitor clinical status, renal function. Follow up cultures, LOT, de-escalate as able.  Height: 5' 4.02" (162.6 cm) Weight: 91.4 kg (201 lb 9.6 oz) IBW/kg (Calculated) : 54.74  Temp (24hrs), Avg:98.3 F (36.8 C), Min:97.6 F (36.4 C), Max:99 F (37.2 C)  Recent Labs  Lab 05/16/22 0218 05/17/22 0006  WBC 10.9* 11.9*  CREATININE 0.80 0.70    Estimated Creatinine Clearance: 91.1 mL/min (by C-G formula based on SCr of 0.7 mg/dL).    Allergies  Allergen Reactions   Pork-Derived Products Other (See Comments)    Strictly no pork derived products- pt is Islam    Antimicrobials this admission: Vancomycin 7/1 >>  Zosyn 7/1 >>   Dose adjustments this admission:   Microbiology results: 7/1 BCx: ng x 1d 7/1 Wound Cx: pending 6/30 MRSA PCR: negative   Thank you for allowing pharmacy to be a part of this patient's care.  Vance Peper, PharmD PGY-2 Pharmacy Resident Phone [phone] 05/17/2022 11:08 AM   Please check AMION for all [place] phone numbers After 10:00 PM, call [place] [phone]

## 2022-05-18 NOTE — Plan of Care (Signed)
  Problem: Education: Goal: Knowledge of General Education information will improve Description Including pain rating scale, medication(s)/side effects and non-pharmacologic comfort measures Outcome: Progressing   Problem: Health Behavior/Discharge Planning: Goal: Ability to manage health-related needs will improve Outcome: Progressing   

## 2022-05-18 NOTE — Progress Notes (Signed)
Ordered XXL pink breast binder for pt. Gave her JP drain holder to use.

## 2022-05-18 NOTE — Progress Notes (Signed)
Patient is now postoperative day 2 status post removal of her left tissue expander for infection.  She is still having some pain but continues to feel better.  Physical exam Temp:  [98.2 F (36.8 C)-98.6 F (37 C)] 98.6 F (37 C) (07/03 0758) Pulse Rate:  [77-87] 87 (07/03 0758) Resp:  [16-20] 18 (07/03 0758) BP: (93-110)/(52-70) 93/55 (07/03 0758) SpO2:  [97 %-100 %] 97 % (07/03 0758)   Breast: Drain continues to have light serosanguineous output.  Still significant erythema over the lateral left breast.  The area of erythema seems to be smaller.  Incision clean dry and intact.  No evidence of undrained fluid collection or hematoma on physical exam.  Intraoperative tissue culture: FEW STAPHYLOCOCCUS AUREUS  SUSCEPTIBILITIES TO FOLLOW   Assessment and plan Patient continues to improve, susceptibilities are pending for the left breast intraoperative culture.  I will discuss duration of continued hospitalization with the team.

## 2022-05-19 LAB — CBC
HCT: 29.6 % — ABNORMAL LOW (ref 36.0–46.0)
Hemoglobin: 9.4 g/dL — ABNORMAL LOW (ref 12.0–15.0)
MCH: 29.3 pg (ref 26.0–34.0)
MCHC: 31.8 g/dL (ref 30.0–36.0)
MCV: 92.2 fL (ref 80.0–100.0)
Platelets: 324 10*3/uL (ref 150–400)
RBC: 3.21 MIL/uL — ABNORMAL LOW (ref 3.87–5.11)
RDW: 13.1 % (ref 11.5–15.5)
WBC: 8.6 10*3/uL (ref 4.0–10.5)
nRBC: 0 % (ref 0.0–0.2)

## 2022-05-19 NOTE — Progress Notes (Signed)
POD 3 s/p removal of tissue expander.  Still with some pain  PE Temp:  [98.2 F (36.8 C)-99.2 F (37.3 C)] 98.5 F (36.9 C) (07/04 0715) Pulse Rate:  [78-87] 79 (07/04 0715) Resp:  [16-18] 16 (07/04 0715) BP: (89-108)/(43-68) 99/43 (07/04 0715) SpO2:  [97 %-100 %] 100 % (07/04 0715)   Breast: Drain continues to have light serosanguineous output.  Still significant erythema over the lateral left breast.  The area of erythema seems to be smaller.  Incision clean dry and intact.  No evidence of undrained fluid collection or hematoma on physical exam.  Culture:  sensitive staph  A/P Continue iv antibiotics, will continue broad spectrum for now since was on antibiotics at time of intraoperative culture.

## 2022-05-19 NOTE — Plan of Care (Signed)
  Problem: Clinical Measurements: Goal: Will remain free from infection Outcome: Not Progressing   Problem: Nutrition: Goal: Adequate nutrition will be maintained Outcome: Not Progressing   Problem: Pain Managment: Goal: General experience of comfort will improve Outcome: Not Progressing

## 2022-05-20 LAB — BASIC METABOLIC PANEL
Anion gap: 11 (ref 5–15)
BUN: 14 mg/dL (ref 6–20)
CO2: 24 mmol/L (ref 22–32)
Calcium: 8.5 mg/dL — ABNORMAL LOW (ref 8.9–10.3)
Chloride: 107 mmol/L (ref 98–111)
Creatinine, Ser: 0.89 mg/dL (ref 0.44–1.00)
GFR, Estimated: >60 mL/min (ref >60)
Glucose, Bld: 121 mg/dL — ABNORMAL HIGH (ref 70–99)
Potassium: 3.9 mmol/L (ref 3.5–5.1)
Sodium: 142 mmol/L (ref 135–145)

## 2022-05-20 LAB — CBC
HCT: 29.2 % — ABNORMAL LOW (ref 36.0–46.0)
Hemoglobin: 9.4 g/dL — ABNORMAL LOW (ref 12.0–15.0)
MCH: 29.3 pg (ref 26.0–34.0)
MCHC: 32.2 g/dL (ref 30.0–36.0)
MCV: 91 fL (ref 80.0–100.0)
Platelets: 343 10*3/uL (ref 150–400)
RBC: 3.21 MIL/uL — ABNORMAL LOW (ref 3.87–5.11)
RDW: 12.8 % (ref 11.5–15.5)
WBC: 8 10*3/uL (ref 4.0–10.5)
nRBC: 0 % (ref 0.0–0.2)

## 2022-05-20 MED ORDER — HYDROCODONE-ACETAMINOPHEN 5-325 MG PO TABS: 1.0000 | ORAL_TABLET | Freq: Four times a day (QID) | ORAL | 0 refills | Status: DC | PRN

## 2022-05-20 MED ORDER — SULFAMETHOXAZOLE-TRIMETHOPRIM 800-160 MG PO TABS: 1.0000 | ORAL_TABLET | Freq: Two times a day (BID) | ORAL | 0 refills | Status: AC

## 2022-05-20 NOTE — Discharge Summary (Signed)
Physician Discharge Summary  Patient ID: Yvette Franklin MRN: [id number] DOB/AGE: [birth year] 51 y.o.  Admit date: 05/15/2022 Discharge date: 05/20/2022  Admission Diagnoses:  Discharge Diagnoses:  Principal Problem:   Infection Left breast  Discharged Condition: good  Hospital Course: The patient was admitted and taken to the OR for removal of expander and ADM.  She had redness and an elevated white count with abnormal labs. She has improved slowly over the past several days.  Her white count has improved.  The redness and swelling has slowly improved.  She is feeling better and would like to go home. Plan for d/c with Bactrim.   Consults: None  Significant Diagnostic Studies: labs  Treatments: surgery  Discharge Exam: Blood pressure 116/78, pulse 70, temperature 98 F (36.7 C), resp. rate 17, height 5' 4.02" (1.626 m), weight 91.4 kg, SpO2 100 %. General appearance: alert, cooperative, and no distress Incision/Wound:improving with mildly less redness and swelling  Disposition: Discharge disposition: 01-Home or Self Care       Discharge Instructions     Call MD for:  difficulty breathing, headache or visual disturbances   Complete by: As directed    Call MD for:  persistant nausea and vomiting   Complete by: As directed    Call MD for:  redness, tenderness, or signs of infection (pain, swelling, redness, odor or green/yellow discharge around incision site)   Complete by: As directed    Call MD for:  severe uncontrolled pain   Complete by: As directed    Call MD for:  temperature >100.4   Complete by: As directed    Diet general   Complete by: As directed    Discharge wound care:   Complete by: As directed    Drain care   Driving Restrictions   Complete by: As directed    No driving while on pain meds   Increase activity slowly   Complete by: As directed    Lifting restrictions   Complete by: As directed    No heavy lifting      Allergies as of 05/20/2022        Reactions   Pork-derived Products Other (See Comments)   Strictly no pork derived products- pt is Islam        Medication List     STOP taking these medications    acetaminophen 500 MG tablet Commonly known as: TYLENOL   diazepam 2 MG tablet Commonly known as: Valium   doxycycline 100 MG tablet Commonly known as: VIBRA-TABS   oxyCODONE 5 MG immediate release tablet Commonly known as: Roxicodone       TAKE these medications    FISH OIL PO Take 1 capsule by mouth daily.   HYDROcodone-acetaminophen 5-325 MG tablet Commonly known as: Norco Take 1 tablet by mouth every 6 (six) hours as needed for moderate pain.   ibuprofen 200 MG tablet Commonly known as: ADVIL Take 400 mg by mouth every 6 (six) hours as needed for headache or mild pain.   losartan-hydrochlorothiazide 100-12.5 MG tablet Commonly known as: HYZAAR Take 1 tablet by mouth daily.   ondansetron 4 MG disintegrating tablet Commonly known as: ZOFRAN-ODT Take 1 tablet (4 mg total) by mouth every 8 (eight) hours as needed for nausea or vomiting.   sulfamethoxazole-trimethoprim 800-160 MG tablet Commonly known as: BACTRIM DS Take 1 tablet by mouth 2 (two) times daily for 10 days.   tamoxifen 10 MG tablet Commonly known as: NOLVADEX Take 0.5 tablets (5 mg total) by  mouth daily.   Xiidra 5 % Soln Generic drug: Lifitegrast Place 1 drop into both eyes daily as needed (dry eye).               Discharge Care Instructions  (From admission, onward)           Start     Ordered   05/20/22 0000  Discharge wound care:       Comments: Drain care   05/20/22 0804             Signed: Loel Lofty [place] Franson 05/20/2022, 8:06 AM

## 2022-05-20 NOTE — Discharge Instructions (Signed)
INSTRUCTIONS FOR AFTER BREAST SURGERY   You will likely have some questions about what to expect following your operation.  The following information will help you and your family understand what to expect when you are discharged from the hospital.  Following these guidelines will help ensure a smooth recovery and reduce risks of complications.  Postoperative instructions include information on: diet, wound care, medications and physical activity.  AFTER SURGERY Expect to go home after the procedure.  In some cases, you may need to spend one night in the hospital for observation.  DIET Breast surgery does not require a specific diet.  However, the healthier you eat the better your body can start healing. It is important to increasing your protein intake.  This means limiting the foods with sugar and carbohydrates.  Focus on vegetables and some meat.  If you have any liposuction during your procedure be sure to drink water.  If your urine is bright yellow, then it is concentrated, and you need to drink more water.  As a general rule after surgery, you should have 8 ounces of water every hour while awake.  If you find you are persistently nauseated or unable to take in liquids let us know.  NO TOBACCO USE or EXPOSURE.  This will slow your healing process and increase the risk of a wound.  WOUND CARE Leave the ACE wrap or binder on for 3 days . Use fragrance free soap.   After 3 days you can remove the ACE wrap or binder to shower. Once dry apply ACE wrap, binder or sports bra.  Use a mild soap like Dial, Dove and Ivory. You may have Topifoam or Lipofoam on.  It is soft and spongy and helps keep you from getting creases if you have liposuction.  This can be removed before the shower and then replaced.  If you need more it is available on [employer] (Lipofoam). If you have steri-strips / tape directly attached to your skin leave them in place. It is OK to get these wet.   No baths, pools or hot tubs for four  weeks. We close your incision to leave the smallest and best-looking scar. No ointment or creams on your incisions until given the go ahead.  Especially not Neosporin (Too many skin reactions with this one).  A few weeks after surgery you can use Mederma and start massaging the scar. We ask you to wear your binder or sports bra for the first 6 weeks around the clock, including while sleeping. This provides added comfort and helps reduce the fluid accumulation at the surgery site.  ACTIVITY No heavy lifting until cleared by the doctor.  This usually means no more than a half-gallon of milk.  It is OK to walk and climb stairs. In fact, moving your legs is very important to decrease your risk of a blood clot.  It will also help keep you from getting deconditioned.  Every 1 to 2 hours get up and walk for 5 minutes. This will help with a quicker recovery back to normal.  Let pain be your guide so you don't do too much.  This is not the time for spring cleaning and don't plan on taking care of anyone else.  This time is for you to recover,  You will be more comfortable if you sleep and rest with your head elevated either with a few pillows under you or in a recliner.  No stomach sleeping for a three months.  WORK Everyone   returns to work at different times. As a rough guide, most people take at least 1 - 2 weeks off prior to returning to work. If you need documentation for your job, bring the forms to your postoperative follow up visit.  DRIVING Arrange for someone to bring you home from the hospital.  You may be able to drive a few days after surgery but not while taking any narcotics or valium.  BOWEL MOVEMENTS Constipation can occur after anesthesia and while taking pain medication.  It is important to stay ahead for your comfort.  We recommend taking Milk of Magnesia (2 tablespoons; twice a day) while taking the pain pills.  MEDICATIONS You may be prescribed should start after surgery At your  preoperative visit for you history and physical you may have been given the following medications: An antibiotic: Start this medication when you get home and take according to the instructions on the bottle. Zofran 4 mg:  This is to treat nausea and vomiting.  You can take this every 6 hours as needed and only if needed. Valium 2 mg: This is for muscle tightness if you have an implant or expander. This will help relax your muscle which also helps with pain control.  This can be taken every 12 hours as needed. Don't drive after taking this medication. Norco (hydrocodone/acetaminophen) 5/325 mg:  This is only to be used after you have taken the motrin or the tylenol. Every 8 hours as needed.   Over the counter Medication to take: Ibuprofen (Motrin) 600 mg:  Take this every 6 hours.  If you have additional pain then take 500 mg of the tylenol every 8 hours.  Only take the Norco after you have tried these two. Miralax or stool softener of choice: Take this according to the bottle if you take the [place] Call your surgeon's office if any of the following occur: Fever 101 degrees F or greater Excessive bleeding or fluid from the incision site. Pain that increases over time without aid from the medications Redness, warmth, or pus draining from incision sites Persistent nausea or inability to take in liquids Severe misshapen area that underwent the operation.

## 2022-05-20 NOTE — Progress Notes (Signed)
Yvette Franklin to be D/C'd  per MD order.  Discussed with the patient and all questions fully answered.  VSS, Skin clean, dry and intact without evidence of skin break down, no evidence of skin tears noted.  IV catheter discontinued intact. Site without signs and symptoms of complications. Dressing and pressure applied.  An After Visit Summary was printed and given to the patient.   D/c education completed with patient/family including follow up instructions, medication list, d/c activities limitations if indicated, with other d/c instructions as indicated by MD - patient able to verbalize understanding, all questions fully answered.   Patient instructed to return to ED, call 911, or call MD for any changes in condition.   Patient to be escorted via [place], and D/C home via private auto.

## 2022-05-21 LAB — AEROBIC/ANAEROBIC CULTURE W GRAM STAIN (SURGICAL/DEEP WOUND)
Gram Stain: NONE SEEN
Gram Stain: NONE SEEN

## 2022-05-21 LAB — CULTURE, BLOOD (ROUTINE X 2)
Culture: NO GROWTH
Culture: NO GROWTH

## 2022-05-22 ENCOUNTER — Ambulatory Visit: Payer: BC Managed Care – PPO | Admitting: Physician Assistant

## 2022-05-26 ENCOUNTER — Encounter: Payer: BC Managed Care – PPO | Admitting: Physician Assistant

## 2022-05-26 NOTE — Progress Notes (Deleted)
Patient is a 51 year old female with PMH of left-sided DCIS s/p left-sided mastectomy with immediate reconstruction using tissue expander Flex HD performed 04/22/2022 by Dr. Marla Roe complicated by infection requiring expander removal and washout performed 05/16/2022 by Dr. Erin Hearing who presents to clinic for post-operative follow-up.  She had been admitted after her expander washout for observation and IV antibiotics.  She was discharged home on 05/20/2022 with Bactrim antibiotics for ongoing coverage of her cellulitis.  Culture obtained in surgery revealed Staph aureus.

## 2022-05-27 ENCOUNTER — Other Ambulatory Visit: Payer: Self-pay | Admitting: *Deleted

## 2022-05-27 MED ORDER — ANASTROZOLE 1 MG PO TABS: 1.0000 mg | ORAL_TABLET | Freq: Every day | ORAL | 0 refills | Status: DC

## 2022-05-27 NOTE — Progress Notes (Signed)
Received call from pt with complaint of nausea and GI upset immediately after taking tamoxifen p.o.  Pt states she has stopped the medication for several days and symptoms have resolved.  Pt also states it has been several years since she has had a menstrual cycle.  Per MD pt to start anastrozole 1 mg tablet p.o daily.  Prescription sent to pharmacy on file. Pt educated and verbalized understanding.  Pt will f/u in office in 1 month to assess tolerance.

## 2022-05-28 ENCOUNTER — Telehealth: Payer: Self-pay | Admitting: Hematology and Oncology

## 2022-05-28 NOTE — Telephone Encounter (Signed)
.  Called patient to schedule appointment per 7/12 inbasket,,left pt msg

## 2022-05-29 ENCOUNTER — Encounter: Payer: Self-pay | Admitting: Plastic Surgery

## 2022-05-29 ENCOUNTER — Ambulatory Visit (INDEPENDENT_AMBULATORY_CARE_PROVIDER_SITE_OTHER): Payer: BC Managed Care – PPO | Admitting: Plastic Surgery

## 2022-05-29 ENCOUNTER — Telehealth: Payer: Self-pay

## 2022-05-29 DIAGNOSIS — D0512 Intraductal carcinoma in situ of left breast: Secondary | ICD-10-CM

## 2022-05-29 MED ORDER — HYDROCODONE-ACETAMINOPHEN 5-325 MG PO TABS: 1.0000 | ORAL_TABLET | Freq: Four times a day (QID) | ORAL | 0 refills | Status: DC | PRN

## 2022-05-29 NOTE — Telephone Encounter (Signed)
Faxed demographics, Rx, insurance information, and today's note to [facility]. Pt was given the actual Rx to take with her once she calls to make an appointment.  Received confirmation fax was received.

## 2022-05-29 NOTE — Progress Notes (Signed)
The patient is a 51 year old female here with her 2 sons for postop visit.  She had the left expander removed with concerns about infection.  Today the redness has resolved.  She still has some tough tight skin on the lateral lower portion.  The drain has only been draining about 15 cc/day.  It is sanguinous for the most part.  She has been without fevers.  Her pain is much better.  She would like to have the drain out.  I think that is reasonable and we did remove it.  I would like to see her back in 1 to 2 weeks.  We will also send in a prescription for some more pain medicine.  I also stressed the importance of protein for healing.

## 2022-06-12 ENCOUNTER — Ambulatory Visit (INDEPENDENT_AMBULATORY_CARE_PROVIDER_SITE_OTHER): Payer: BC Managed Care – PPO | Admitting: Physician Assistant

## 2022-06-12 DIAGNOSIS — Z9889 Other specified postprocedural states: Secondary | ICD-10-CM

## 2022-06-12 NOTE — Progress Notes (Signed)
This is a pleasant 51 year old female seen in our office for follow-up evaluation.  She is status post left expander removal secondary to concerns of infection on 05/16/2022 by Dr. Erin Hearing.  The patient was most recently seen in our office by Dr. Marla Roe on 05/29/2022.  At that time her drain was putting out minimal output, she was afebrile, with no infectious symptoms.  The drain was removed at that time.  Since her last office visit the patient notes she has been doing well, she notes the tenderness she had experienced in the left breast has significantly improved, she denies any infectious symptoms including fever nausea vomiting, no redness or discharge.   On exam the left breast flaps are warm and well-perfused, her incision is clean dry and intact with some retraction of the medial portion.  She has no surrounding redness warmth or discharge.   Overall she appears to be doing well, I did remove her Prolene stitches today she tolerated this without issue.  She does have some retraction of her scar, she was given strict wound care instructions and strict return precautions in the event she develops any new or worsening signs or symptoms.  The patient will follow back in our office in 2 months for reevaluation.  She will follow-up sooner if she develops any new or worsening signs or symptoms.  The patient verbalized understanding and agreement to today's plan had no further questions or concerns.  The patient son was present and a chaperone was present as well.

## 2022-06-19 NOTE — Progress Notes (Signed)
Patient Care Team: Vevelyn Francois, NP as PCP - General (Adult Health Nurse Practitioner) Jovita Kussmaul, MD as Consulting Physician (General Surgery) Nicholas Lose, MD as Consulting Physician (Hematology and Oncology) Kyung Rudd, MD as Consulting Physician (Radiation Oncology) Mauro Kaufmann, RN as Oncology Nurse Navigator Rockwell Germany, RN as Oncology Nurse Navigator  DIAGNOSIS:  Encounter Diagnosis  Name Primary?   Ductal carcinoma in situ (DCIS) of left breast     SUMMARY OF ONCOLOGIC HISTORY: Oncology History  Ductal carcinoma in situ (DCIS) of left breast  02/09/2022 Initial Diagnosis   Screening mammogram detected left breast asymmetry and calcifications 1.5 cm stereotactic biopsy revealed high-grade DCIS ER 20% weak, PR 0% Right breast asymmetry: Ultrasound: Micro 6 6 mm: Biopsy pending   02/18/2022 Cancer Staging   Staging form: Breast, AJCC 8th Edition - Clinical: Stage 0 (cTis (DCIS), cN0, cM0, G3, ER+, PR-, HER2: Not Assessed) - Signed by Nicholas Lose, MD on 02/18/2022 Stage prefix: Initial diagnosis Histologic grading system: 3 grade system   02/26/2022 Genetic Testing   gative hereditary cancer genetic testing: no pathogenic variants detected in [place] CustomNext-Cancer +RNAinsight Panel.  Report date is February 26, 2022.   The CustomNext-Cancer+RNAinsight panel offered by [facility] includes sequencing and rearrangement analysis for the following 47 genes:  APC, ATM, AXIN2, BARD1, BMPR1A, BRCA1, BRCA2, BRIP1, CDH1, CDK4, CDKN2A, CHEK2, DICER1, EPCAM, GREM1, HOXB13, MEN1, MLH1, MSH2, MSH3, MSH6, MUTYH, NBN, NF1, NF2, NTHL1, PALB2, PMS2, POLD1, POLE, PTEN, RAD51C, RAD51D, RECQL, RET, SDHA, SDHAF2, SDHB, SDHC, SDHD, SMAD4, SMARCA4, STK11, TP53, TSC1, TSC2, and VHL.  RNA data is routinely analyzed for use in variant interpretation for all genes.    04/22/2022 Surgery   Left Lumpectomy: HG DCIS, margins Neg, 0/4 LN Neg, ER 20%, PR 0%     CHIEF COMPLIANT: Follow-up  to discuss tolerance of anastrozole  INTERVAL HISTORY: Yvette Franklin is a 51 y.o female is here because of recent diagnosis of left breast DCIS. She presents to the clinic today for a follow-up to discuss anastrozole.  She reports that she could not tolerate anastrozole and had to discontinue it as well.  She developed joint aches and pains as well as leg swelling.  Since she stopped it couple weeks ago she is feeling somewhat better but not fully better.  She is trying to exercise regularly but she tells me that her endurance has come down.   ALLERGIES:  is allergic to pork-derived products.  MEDICATIONS:  Current Outpatient Medications  Medication Sig Dispense Refill   exemestane (AROMASIN) 25 MG tablet Take 1 tablet (25 mg total) by mouth daily after breakfast. 90 tablet 3   ibuprofen (ADVIL) 200 MG tablet Take 400 mg by mouth every 6 (six) hours as needed for headache or mild pain.     losartan-hydrochlorothiazide (HYZAAR) 100-12.5 MG tablet Take 1 tablet by mouth daily. 90 tablet 3   Omega-3 Fatty Acids (FISH OIL PO) Take 1 capsule by mouth daily.     ondansetron (ZOFRAN-ODT) 4 MG disintegrating tablet Take 1 tablet (4 mg total) by mouth every 8 (eight) hours as needed for nausea or vomiting. 20 tablet 0   XIIDRA 5 % SOLN Place 1 drop into both eyes daily as needed (dry eye).     No current facility-administered medications for this visit.    PHYSICAL EXAMINATION: ECOG PERFORMANCE STATUS: 1 - Symptomatic but completely ambulatory  Vitals:   06/30/22 1417  BP: (!) 137/91  Pulse: 89  Resp: 18  Temp: (!) 97.5 F (36.4 C)  SpO2: 100%   Filed Weights   06/30/22 1417  Weight: 197 lb 12.8 oz (89.7 kg)     LABORATORY DATA:  I have reviewed the data as listed    Latest Ref Rng & Units 05/20/2022   12:45 AM 05/17/2022   12:06 AM 05/16/2022    2:18 AM  CMP  Glucose 70 - 99 mg/dL 121  140  114   BUN 6 - 20 mg/dL 14  9  9    Creatinine 0.44 - 1.00 mg/dL 0.89  0.70  0.80   Sodium 135  - 145 mmol/L 142  140  138   Potassium 3.5 - 5.1 mmol/L 3.9  4.3  3.3   Chloride 98 - 111 mmol/L 107  107  104   CO2 22 - 32 mmol/L 24  24  25    Calcium 8.9 - 10.3 mg/dL 8.5  8.5  8.6   Total Protein 6.5 - 8.1 g/dL  5.8  6.3   Total Bilirubin 0.3 - 1.2 mg/dL  0.5  0.6   Alkaline Phos 38 - 126 U/L  91  86   AST 15 - 41 U/L  38  63   ALT 0 - 44 U/L  54  64     Lab Results  Component Value Date   WBC 8.0 05/20/2022   HGB 9.4 (L) 05/20/2022   HCT 29.2 (L) 05/20/2022   MCV 91.0 05/20/2022   PLT 343 05/20/2022   NEUTROABS 7.6 05/16/2022    ASSESSMENT & PLAN:  Ductal carcinoma in situ (DCIS) of left breast  02/09/2022:Screening mammogram detected left breast asymmetry and calcifications 1.5 cm stereotactic biopsy revealed high-grade DCIS ER 20% weak, PR 0% Right breast asymmetry: Ultrasound: Micro 6 6 mm: Biopsy done today   Recommendation: 1.  Left mastectomy 04/22/22: HG DCIS, margins Neg, 0/4 LN Neg, ER 20%, PR 0% 2. Followed by antiestrogen therapy with tamoxifen 5 mg 5 years (based on TAM-01 clinical trial) discontinued 3.  Switched to anastrozole discontinued 06/16/2022 because of joint pains  Treatment plan: Switch to exemestane starting 07/31/2022. Start half a tablet today. Telephone call in mid October to discuss tolerance to exemestane.       No orders of the defined types were placed in this encounter.  The patient has a good understanding of the overall plan. she agrees with it. she will call with any problems that may develop before the next visit here. Total time spent: 30 mins including face to face time and time spent for planning, charting and co-ordination of care   Harriette Ohara, MD 06/30/22    I Gardiner Coins am scribing for Dr. Lindi Adie  I have reviewed the above documentation for accuracy and completeness, and I agree with the above.

## 2022-06-20 ENCOUNTER — Other Ambulatory Visit: Payer: Self-pay | Admitting: Hematology and Oncology

## 2022-06-25 ENCOUNTER — Telehealth: Payer: Self-pay

## 2022-06-25 ENCOUNTER — Telehealth: Payer: Self-pay | Admitting: *Deleted

## 2022-06-25 NOTE — Telephone Encounter (Signed)
Received call from pt with complaint of bilateral lower extremity joint pain and edema.  Pt denies redness or warmth to lower extremities and states edema is alleviated with rest and elevation.  Pt currently schedule to f/u with MD next week to discuss tolerance to Anastrozole. Pt educated to stop Anastrozole today to assess if symptoms resolve being off the medication.  Pt educated to contact the office if symptoms progress.  Pt verbalized understanding.

## 2022-06-25 NOTE — Telephone Encounter (Signed)
Called and left VM for pt to return call.

## 2022-06-30 ENCOUNTER — Inpatient Hospital Stay: Payer: BC Managed Care – PPO | Attending: Hematology and Oncology | Admitting: Hematology and Oncology

## 2022-06-30 ENCOUNTER — Other Ambulatory Visit: Payer: Self-pay

## 2022-06-30 DIAGNOSIS — D0512 Intraductal carcinoma in situ of left breast: Secondary | ICD-10-CM | POA: Diagnosis not present

## 2022-06-30 DIAGNOSIS — Z79811 Long term (current) use of aromatase inhibitors: Secondary | ICD-10-CM | POA: Diagnosis not present

## 2022-06-30 DIAGNOSIS — Z79899 Other long term (current) drug therapy: Secondary | ICD-10-CM | POA: Diagnosis not present

## 2022-06-30 MED ORDER — EXEMESTANE 25 MG PO TABS: 25.0000 mg | ORAL_TABLET | Freq: Every day | ORAL | 3 refills | Status: DC

## 2022-06-30 NOTE — Assessment & Plan Note (Addendum)
02/09/2022:Screening mammogram detected left breast asymmetry and calcifications 1.5 cm stereotactic biopsy revealed high-grade DCIS ER 20% weak, PR 0% Right breast asymmetry: Ultrasound: Micro 6 6 mm: Biopsydone today  Recommendation: 1.  Left mastectomy 04/22/22: HG DCIS, margins Neg, 0/4 LN Neg, ER 20%, PR 0% 2. Followed by antiestrogen therapy with tamoxifen 5 mg 5 years (based on TAM-01 clinical trial) discontinued 3.  Switched to anastrozole discontinued 06/16/2022 because of joint pains  Treatment plan: Switch to exemestane starting 07/31/2022. Start half a tablet today. Telephone call in mid October to discuss tolerance to exemestane.

## 2022-07-06 ENCOUNTER — Other Ambulatory Visit (HOSPITAL_COMMUNITY): Payer: Self-pay

## 2022-07-06 ENCOUNTER — Other Ambulatory Visit: Payer: Self-pay | Admitting: Nurse Practitioner

## 2022-07-06 DIAGNOSIS — I10 Essential (primary) hypertension: Secondary | ICD-10-CM

## 2022-07-06 MED ORDER — LOSARTAN POTASSIUM-HCTZ 100-12.5 MG PO TABS
1.0000 | ORAL_TABLET | Freq: Every day | ORAL | 3 refills | Status: DC
  Filled 2022-07-06: qty 30, 30d supply, fill #0
  Filled 2022-10-19: qty 30, 30d supply, fill #1
  Filled 2022-11-18: qty 30, 30d supply, fill #2
  Filled 2022-12-31: qty 30, 30d supply, fill #3

## 2022-07-31 ENCOUNTER — Ambulatory Visit (INDEPENDENT_AMBULATORY_CARE_PROVIDER_SITE_OTHER): Payer: BC Managed Care – PPO | Admitting: Physician Assistant

## 2022-07-31 ENCOUNTER — Ambulatory Visit: Payer: BC Managed Care – PPO

## 2022-07-31 ENCOUNTER — Encounter: Payer: Self-pay | Admitting: Physician Assistant

## 2022-07-31 ENCOUNTER — Ambulatory Visit
Admission: RE | Admit: 2022-07-31 | Discharge: 2022-07-31 | Disposition: A | Discharge status: Home / Self Care | Payer: BC Managed Care – PPO | Source: Ambulatory Visit | Attending: Nurse Practitioner | Admitting: Nurse Practitioner

## 2022-07-31 DIAGNOSIS — Z9889 Other specified postprocedural states: Secondary | ICD-10-CM

## 2022-07-31 DIAGNOSIS — N6489 Other specified disorders of breast: Secondary | ICD-10-CM

## 2022-07-31 NOTE — Progress Notes (Signed)
This is a pleasant 51 year old female seen in our office for follow-up evaluation status post left expander removal secondary to concerns of infection on 05/16/2022 by Dr. Erin Hearing.  The patient was most recently seen in our office on 06/12/2022.  She had been doing well at that last office visit with minimal complaints and no infectious symptoms.  Since her last office visit she denies any infectious symptoms including fever chills nausea vomiting, no surrounding redness or discharge.  She notes that the appearance of the left chest has improved as far as her comfort with an absent breast.  She is concerned that the skin flaps have inverted in and does not like the way this looks.  She would like to proceed with tissue range arrangement to have a more flat appearance.  She does not want to proceed with any expander placement.  Chaperone present.  On exam the left breast flaps are warm and well-perfused with no redness or discharge.  Her incisions clean dry and intact with some retraction and inversion of the medial portion.  No areas of fluctuance.  Overall the patient is doing well.  She is not 924% certain but is becoming more confident that she does not want any further expander or implants.  She would like the excess tissue removed.  I spoke with Dr. Marla Roe we would like her to follow-up in the office in 1 to 2 months for reevaluation and potential surgical planning at that time.  The patient was given strict return precautions.  Both the patient and her son verbalized understanding and agreement to today's plan had no further questions or concerns.

## 2022-08-05 ENCOUNTER — Telehealth: Payer: Self-pay | Admitting: Adult Health

## 2022-08-05 NOTE — Telephone Encounter (Signed)
Rescheduled appointment per provider PAL. Left voicemail. 

## 2022-08-19 ENCOUNTER — Telehealth: Payer: Self-pay | Admitting: Hematology and Oncology

## 2022-08-19 NOTE — Telephone Encounter (Signed)
Rescheduled appointment per provider on call. Left voicemail.  

## 2022-08-21 ENCOUNTER — Encounter: Payer: BC Managed Care – PPO | Admitting: Adult Health

## 2022-08-24 ENCOUNTER — Other Ambulatory Visit: Payer: Self-pay

## 2022-08-24 ENCOUNTER — Inpatient Hospital Stay: Payer: BC Managed Care – PPO | Attending: Hematology and Oncology | Admitting: Adult Health

## 2022-08-24 ENCOUNTER — Encounter: Payer: Self-pay | Admitting: Adult Health

## 2022-08-24 VITALS — BP 127/78 | HR 73 | Temp 97.8°F | Resp 18 | Ht 60.0 in | Wt 204.0 lb

## 2022-08-24 DIAGNOSIS — E2839 Other primary ovarian failure: Secondary | ICD-10-CM

## 2022-08-24 DIAGNOSIS — Z79899 Other long term (current) drug therapy: Secondary | ICD-10-CM | POA: Diagnosis not present

## 2022-08-24 DIAGNOSIS — Z9012 Acquired absence of left breast and nipple: Secondary | ICD-10-CM | POA: Diagnosis not present

## 2022-08-24 DIAGNOSIS — Z1231 Encounter for screening mammogram for malignant neoplasm of breast: Secondary | ICD-10-CM | POA: Diagnosis not present

## 2022-08-24 DIAGNOSIS — D0512 Intraductal carcinoma in situ of left breast: Secondary | ICD-10-CM | POA: Diagnosis not present

## 2022-08-24 NOTE — Progress Notes (Signed)
SURVIVORSHIP VISIT:   BRIEF ONCOLOGIC HISTORY:  Oncology History  Ductal carcinoma in situ (DCIS) of left breast  02/09/2022 Initial Diagnosis   Screening mammogram detected left breast asymmetry and calcifications 1.5 cm stereotactic biopsy revealed high-grade DCIS ER 20% weak, PR 0% Right breast asymmetry: Ultrasound: Micro 6 6 mm: Biopsy pending   02/18/2022 Cancer Staging   Staging form: Breast, AJCC 8th Edition - Clinical: Stage 0 (cTis (DCIS), cN0, cM0, G3, ER+, PR-, HER2: Not Assessed) - Signed by Nicholas Lose, MD on 02/18/2022 Stage prefix: Initial diagnosis Histologic grading system: 3 grade system   02/26/2022 Genetic Testing   gative hereditary cancer genetic testing: no pathogenic variants detected in [place] CustomNext-Cancer +RNAinsight Panel.  Report date is February 26, 2022.   The CustomNext-Cancer+RNAinsight panel offered by [facility] includes sequencing and rearrangement analysis for the following 47 genes:  APC, ATM, AXIN2, BARD1, BMPR1A, BRCA1, BRCA2, BRIP1, CDH1, CDK4, CDKN2A, CHEK2, DICER1, EPCAM, GREM1, HOXB13, MEN1, MLH1, MSH2, MSH3, MSH6, MUTYH, NBN, NF1, NF2, NTHL1, PALB2, PMS2, POLD1, POLE, PTEN, RAD51C, RAD51D, RECQL, RET, SDHA, SDHAF2, SDHB, SDHC, SDHD, SMAD4, SMARCA4, STK11, TP53, TSC1, TSC2, and VHL.  RNA data is routinely analyzed for use in variant interpretation for all genes.    04/22/2022 Surgery   Left Mastectomy: HG DCIS, margins Neg, 0/4 LN Neg, ER 20%, PR 0%   06/2022 -  Anti-estrogen oral therapy   Exemestane     INTERVAL HISTORY:  Yvette Franklin to review her survivorship care plan detailing her treatment course for breast cancer, as well as monitoring long-term side effects of that treatment, education regarding health maintenance, screening, and overall wellness and health promotion.     Overall, Yvette Franklin reports feeling quite well .  She is taking exemestane 1/2 tablet daily and is tolerating it well.   REVIEW OF SYSTEMS:  Review of Systems   Constitutional:  Negative for appetite change, chills, fatigue, fever and unexpected weight change.  HENT:   Negative for hearing loss, lump/mass and trouble swallowing.   Eyes:  Negative for eye problems and icterus.  Respiratory:  Negative for chest tightness, cough and shortness of breath.   Cardiovascular:  Negative for chest pain, leg swelling and palpitations.  Gastrointestinal:  Negative for abdominal distention, abdominal pain, constipation, diarrhea, nausea and vomiting.  Endocrine: Negative for hot flashes.  Genitourinary:  Negative for difficulty urinating.   Musculoskeletal:  Negative for arthralgias.  Skin:  Negative for itching and rash.  Neurological:  Negative for dizziness, extremity weakness, headaches and numbness.  Hematological:  Negative for adenopathy. Does not bruise/bleed easily.  Psychiatric/Behavioral:  Negative for depression. The patient is not nervous/anxious.       ONCOLOGY TREATMENT TEAM:  1. Surgeon:  Dr. Marlou Starks at [facility] 2. Medical Oncologist: Dr. Lindi Adie  3. Radiation Oncologist: Dr. Lisbeth Renshaw    PAST MEDICAL/SURGICAL HISTORY:  Past Medical History:  Diagnosis Date   Breast cancer [facility])    left breast DCIS   Complication of anesthesia    Family history of GYN cancer 02/20/2022   Hypertension    PONV (postoperative nausea and vomiting)    Past Surgical History:  Procedure Laterality Date   BREAST BIOPSY Left 02/09/2022   BREAST BIOPSY Right 02/18/2022   BREAST RECONSTRUCTION WITH PLACEMENT OF TISSUE EXPANDER AND ALLODERM Left 04/22/2022   Procedure: BREAST RECONSTRUCTION WITH PLACEMENT OF TISSUE EXPANDER AND FLEX HD;  Surgeon: Wallace Going, DO;  Location: [place];  Service: Plastics;  Laterality: Left;  CHOLECYSTECTOMY     IRRIGATION AND DEBRIDEMENT ABDOMEN Left 05/16/2022   Procedure: IRRIGATION AND DEBRIDEMENT OF LEFT BREAST POCKET;  Surgeon: Lennice Sites, MD;  Location: [place];  Service: Plastics;   Laterality: Left;   MASTECTOMY W/ SENTINEL NODE BIOPSY Left 04/22/2022   Procedure: LEFT MASTECTOMY WITH SENTINEL LYMPH NODE BIOPSY;  Surgeon: Jovita Kussmaul, MD;  Location: [place];  Service: General;  Laterality: Left;   REMOVAL OF TISSUE EXPANDER AND PLACEMENT OF IMPLANT Left 05/16/2022   Procedure: REMOVAL OF TISSUE EXPANDER;  Surgeon: Lennice Sites, MD;  Location: [place];  Service: Plastics;  Laterality: Left;     ALLERGIES:  Allergies  Allergen Reactions   Pork-Derived Products Other (See Comments)    Strictly no pork derived products- pt is Islam     CURRENT MEDICATIONS:  Outpatient Encounter Medications as of 08/24/2022  Medication Sig Note   exemestane (AROMASIN) 25 MG tablet Take 1 tablet (25 mg total) by mouth daily after breakfast.    ibuprofen (ADVIL) 200 MG tablet Take 400 mg by mouth every 6 (six) hours as needed for headache or mild pain.    losartan-hydrochlorothiazide (HYZAAR) 100-12.5 MG tablet Take 1 tablet by mouth daily.    Omega-3 Fatty Acids (FISH OIL PO) Take 1 capsule by mouth daily.    ondansetron (ZOFRAN-ODT) 4 MG disintegrating tablet Take 1 tablet (4 mg total) by mouth every 8 (eight) hours as needed for nausea or vomiting. 05/16/2022: Pt has at home if needed, has never had to use.   XIIDRA 5 % SOLN Place 1 drop into both eyes daily as needed (dry eye).    No facility-administered encounter medications on file as of 08/24/2022.     ONCOLOGIC FAMILY HISTORY:  Family History  Problem Relation Age of Onset   Healthy Mother    Healthy Father    Cancer Maternal Aunt        GYN; ? ovarian   Cancer Cousin        unknown type; dx 82s   Breast cancer Neg Hx      SOCIAL HISTORY:  Social History   Socioeconomic History   Marital status: Married    Spouse name: Not on file   Number of children: Not on file   Years of education: Not on file   Highest education level: Not on file  Occupational History   Not on file  Tobacco Use    Smoking status: Never   Smokeless tobacco: Never  Vaping Use   Vaping Use: Never used  Substance and Sexual Activity   Alcohol use: No   Drug use: No   Sexual activity: Yes    Birth control/protection: Post-menopausal    Comment: no periods >75yr  Other Topics Concern   Not on file  Social History Narrative   Not on file   Social Determinants of Health   Financial Resource Strain: Not on file  Food Insecurity: Not on file  Transportation Needs: Not on file  Physical Activity: Not on file  Stress: Not on file  Social Connections: Not on file  Intimate Partner Violence: Not on file     OBSERVATIONS/OBJECTIVE:  There were no vitals taken for this visit. GENERAL: Patient is a well appearing female in no acute distress HEENT:  Sclerae anicteric.  Oropharynx clear and moist. No ulcerations or evidence of oropharyngeal candidiasis. Neck is supple.  NODES:  No cervical, supraclavicular, or axillary lymphadenopathy palpated.  BREAST EXAM: Left breast status postmastectomy, no sign  of local recurrence, right breast benign LUNGS:  Clear to auscultation bilaterally.  No wheezes or rhonchi. HEART:  Regular rate and rhythm. No murmur appreciated. ABDOMEN:  Soft, nontender.  Positive, normoactive bowel sounds. No organomegaly palpated. MSK:  No focal spinal tenderness to palpation. Full range of motion bilaterally in the upper extremities. EXTREMITIES:  No peripheral edema.   SKIN:  Clear with no obvious rashes or skin changes. No nail dyscrasia. NEURO:  Nonfocal. Well oriented.  Appropriate affect.  LABORATORY DATA:  None for this visit.  DIAGNOSTIC IMAGING:  None for this visit.      ASSESSMENT AND PLAN:  Ms.. Franklin is a pleasant 51 y.o. female with Stage 0 left breast DCIS, ER+/PR-, diagnosed in March 2023, treated with mastectomy and anti-estrogen therapy with exemestane beginning in August 2023.  She presents to the Survivorship Clinic for our initial meeting and routine  follow-up post-completion of treatment for breast cancer.    1. Stage 0 left breast cancer:  Yvette Franklin is continuing to recover from definitive treatment for breast cancer. She will follow-up with her medical oncologist, Dr. Lindi Adie in 6 months with history and physical exam per surveillance protocol.  She will continue her anti-estrogen therapy with exemestane. Thus far, she is tolerating the Mustain well, with minimal side effects. She was instructed to make Dr. Lindi Adie or myself aware if she begins to experience any worsening side effects of the medication and I could see her back in clinic to help manage those side effects, as needed. Her mammogram is due February 2024; orders placed today.  Today, a comprehensive survivorship care plan and treatment summary was reviewed with the patient today detailing her breast cancer diagnosis, treatment course, potential late/long-term effects of treatment, appropriate follow-up care with recommendations for the future, and patient education resources.  A copy of this summary, along with a letter will be sent to the patient's primary care provider via mail/fax/In Basket message after today's visit.    2. Bone health:  Given Ms. Depace's age/history of breast cancer and her current treatment regimen including anti-estrogen therapy with exemestane, she is at risk for bone demineralization.  She has not undergone bone density testing and I placed orders for this.  She was given education on specific activities to promote bone health.  3. Cancer screening:  Due to Ms. Weinert's history and her age, she should receive screening for skin cancers, colon cancer, and cervical cancer..  The information and recommendations are listed on the patient's comprehensive care plan/treatment summary and were reviewed in detail with the patient.    4. Health maintenance and wellness promotion: Ms. Caplin was encouraged to consume 5-7 servings of fruits and vegetables per day. We reviewed  the "Nutrition Rainbow" handout.  She was also encouraged to engage in moderate to vigorous exercise for 30 minutes per day most days of the week. We discussed the [employer] fitness program, which is designed for cancer survivors to help them become more physically fit after cancer treatments.  She was instructed to limit her alcohol consumption and continue to abstain from tobacco use\.     5. Support services/counseling: It is not uncommon for this period of the patient's cancer care trajectory to be one of many emotions and stressors.  She was given information regarding our available services and encouraged to contact me with any questions or for help enrolling in any of our support group/programs.    Follow up instructions:    -Return to cancer center in  6 months for follow-up with Dr. Lindi Adie -Mammogram due in February 2024 -Bone density testing ordered -Follow up with surgery in 1 year -She is welcome to return back to the Survivorship Clinic at any time; no additional follow-up needed at this time.  -Consider referral back to survivorship as a long-term survivor for continued surveillance  The patient was provided an opportunity to ask questions and all were answered. The patient agreed with the plan and demonstrated an understanding of the instructions.   Total encounter time:30 minutes*in face-to-face visit time, chart review, lab review, care coordination, order entry, and documentation of the encounter time.    Wilber Bihari, NP 08/24/22 4:18 PM Medical Oncology and Hematology [facility] [place], [place] [address] Tel. [phone]    Fax. [phone]  *Total Encounter Time as defined by the Centers for Medicare and Medicaid Services includes, in addition to the face-to-face time of a patient visit (documented in the note above) non-face-to-face time: obtaining and reviewing outside history, ordering and reviewing medications, tests or  procedures, care coordination (communications with other health care professionals or caregivers) and documentation in the medical record.

## 2022-08-25 ENCOUNTER — Telehealth: Payer: Self-pay | Admitting: Hematology and Oncology

## 2022-08-25 NOTE — Telephone Encounter (Signed)
Scheduled appointment per 10/9 los. Left voicemail.

## 2022-09-03 ENCOUNTER — Telehealth: Payer: BC Managed Care – PPO | Admitting: Hematology and Oncology

## 2022-09-07 NOTE — Progress Notes (Signed)
HEMATOLOGY-ONCOLOGY TELEPHONE VISIT PROGRESS NOTE  I connected with our patient on 09/09/22 at  2:00 PM EDT by telephone and verified that I am speaking with the correct person using two identifiers.  I discussed the limitations, risks, security and privacy concerns of performing an evaluation and management service by telephone and the availability of in person appointments.  I also discussed with the patient that there may be a patient responsible charge related to this service. The patient expressed understanding and agreed to proceed.   History of Present Illness: Yvette Franklin is a 51 y.o female is here because of recent diagnosis of left breast DCIS. She presents to the clinic via phone.  She could not tolerate exemestane and after 2 weeks of taking half a tablet she discontinued it because of diffuse body aches and pains.  Oncology History  Ductal carcinoma in situ (DCIS) of left breast  02/09/2022 Initial Diagnosis   Screening mammogram detected left breast asymmetry and calcifications 1.5 cm stereotactic biopsy revealed high-grade DCIS ER 20% weak, PR 0% Right breast asymmetry: Ultrasound: Micro 6 6 mm: Biopsy pending   02/18/2022 Cancer Staging   Staging form: Breast, AJCC 8th Edition - Clinical: Stage 0 (cTis (DCIS), cN0, cM0, G3, ER+, PR-, HER2: Not Assessed) - Signed by Nicholas Lose, MD on 02/18/2022 Stage prefix: Initial diagnosis Histologic grading system: 3 grade system   02/26/2022 Genetic Testing   gative hereditary cancer genetic testing: no pathogenic variants detected in [facility]Next-Cancer +RNAinsight Panel.  Report date is February 26, 2022.   The CustomNext-Cancer+RNAinsight panel offered by [facility] includes sequencing and rearrangement analysis for the following 47 genes:  APC, ATM, AXIN2, BARD1, BMPR1A, BRCA1, BRCA2, BRIP1, CDH1, CDK4, CDKN2A, CHEK2, DICER1, EPCAM, GREM1, HOXB13, MEN1, MLH1, MSH2, MSH3, MSH6, MUTYH, NBN, NF1, NF2, NTHL1, PALB2, PMS2, POLD1, POLE,  PTEN, RAD51C, RAD51D, RECQL, RET, SDHA, SDHAF2, SDHB, SDHC, SDHD, SMAD4, SMARCA4, STK11, TP53, TSC1, TSC2, and VHL.  RNA data is routinely analyzed for use in variant interpretation for all genes.    04/22/2022 Surgery   Left Mastectomy: HG DCIS, margins Neg, 0/4 LN Neg, ER 20%, PR 0%   06/2022 -  Anti-estrogen oral therapy   Exemestane     REVIEW OF SYSTEMS:   Constitutional: Denies fevers, chills or abnormal weight loss All other systems were reviewed with the patient and are negative. Observations/Objective:     Assessment Plan:  Ductal carcinoma in situ (DCIS) of left breast 02/09/2022:Screening mammogram detected left breast asymmetry and calcifications 1.5 cm stereotactic biopsy revealed high-grade DCIS ER 20% weak, PR 0% Right breast asymmetry: Ultrasound: Micro 6 6 mm: Biopsy done today   Recommendation: 1.  Left mastectomy 04/22/22: HG DCIS, margins Neg, 0/4 LN Neg, ER 20%, PR 0% 2. Followed by antiestrogen therapy with tamoxifen 5 mg 5 years (based on TAM-01 clinical trial) discontinued 3.  Switched to anastrozole discontinued 06/16/2022 because of joint pains   Treatment plan: Switch to exemestane starting 07/31/2022. - Diffuse body pains Discontinued it. Because she has tried all options for antiestrogen therapy we will not continue any further antiestrogen treatments.  She will continue to get annual mammograms and I will see her annually for breast exams.   I discussed the assessment and treatment plan with the patient. The patient was provided an opportunity to ask questions and all were answered. The patient agreed with the plan and demonstrated an understanding of the instructions. The patient was advised to call back or seek an in-person evaluation if the  symptoms worsen or if the condition fails to improve as anticipated.   I provided 12 minutes of non-face-to-face time during this encounter.  This includes time for charting and coordination of care   Harriette Ohara,  MD

## 2022-09-09 ENCOUNTER — Inpatient Hospital Stay (HOSPITAL_BASED_OUTPATIENT_CLINIC_OR_DEPARTMENT_OTHER): Payer: BC Managed Care – PPO | Admitting: Hematology and Oncology

## 2022-09-09 DIAGNOSIS — D0512 Intraductal carcinoma in situ of left breast: Secondary | ICD-10-CM

## 2022-09-09 NOTE — Assessment & Plan Note (Signed)
02/09/2022:Screening mammogram detected left breast asymmetry and calcifications 1.5 cm stereotactic biopsy revealed high-grade DCIS ER 20% weak, PR 0% Right breast asymmetry: Ultrasound: Micro 6 6 mm: Biopsydone today  Recommendation: 1.Left mastectomy6/7/23: HG DCIS, margins Neg, 0/4 LN Neg, ER 20%, PR 0% 2. Followed by antiestrogen therapy with tamoxifen 5 mg 5 years (based on TAM-01 clinical trial) discontinued 3.  Switched to anastrozole discontinued 06/16/2022 because of joint pains  Treatment plan: Switch to exemestane starting 07/31/2022. Start half a tablet today. Telephone call in mid October to discuss tolerance to exemestane.

## 2022-09-22 ENCOUNTER — Ambulatory Visit (HOSPITAL_BASED_OUTPATIENT_CLINIC_OR_DEPARTMENT_OTHER)
Admission: RE | Admit: 2022-09-22 | Discharge: 2022-09-22 | Disposition: A | Discharge status: Home / Self Care | Payer: BC Managed Care – PPO | Source: Ambulatory Visit | Attending: Adult Health | Admitting: Adult Health

## 2022-09-22 DIAGNOSIS — E2839 Other primary ovarian failure: Secondary | ICD-10-CM | POA: Insufficient documentation

## 2022-09-22 DIAGNOSIS — Z780 Asymptomatic menopausal state: Secondary | ICD-10-CM | POA: Diagnosis not present

## 2022-09-23 ENCOUNTER — Telehealth: Payer: Self-pay

## 2022-09-23 NOTE — Telephone Encounter (Signed)
Pt aware of results per NP. She verbalized thanks and understanding. Knows to call with any concerns.

## 2022-09-23 NOTE — Telephone Encounter (Signed)
-----   Message from Gardenia Phlegm, NP sent at 09/22/2022  2:43 PM EST ----- Normal bone density, please notify patient ----- Message ----- From: Interface, Rad Results In Sent: 09/22/2022   1:37 PM EST To: Gardenia Phlegm, NP

## 2022-09-25 ENCOUNTER — Ambulatory Visit (INDEPENDENT_AMBULATORY_CARE_PROVIDER_SITE_OTHER): Payer: BC Managed Care – PPO | Admitting: Plastic Surgery

## 2022-09-25 ENCOUNTER — Encounter: Payer: Self-pay | Admitting: Plastic Surgery

## 2022-09-25 VITALS — BP 118/78 | HR 87

## 2022-09-25 DIAGNOSIS — Z9012 Acquired absence of left breast and nipple: Secondary | ICD-10-CM

## 2022-09-25 DIAGNOSIS — D0512 Intraductal carcinoma in situ of left breast: Secondary | ICD-10-CM | POA: Diagnosis not present

## 2022-09-25 DIAGNOSIS — Z9010 Acquired absence of unspecified breast and nipple: Secondary | ICD-10-CM | POA: Insufficient documentation

## 2022-09-25 NOTE — Progress Notes (Signed)
   Subjective:    Patient ID: Yvette Franklin, female    DOB: [birth year], 51 y.o.   MRN: [id number]  The patient is a 51 year old female here for evaluation of her breasts.  The patient was diagnosed with left breast DCIS in May 2023.  It was estrogen positive and progesterone negative.  She is 5 feet 4 inches tall and weighs around 195 pounds.  She is not a smoker and does not have diabetes.  She had a left mastectomy with reconstruction with placement of an expander.  She then had some issues with the expander and it had to be removed in July 2023.  Since then she has been doing really well and feeling much better.  Today she looks great and had a big smile on her face.  She does not want the expander placed back in.  She wanted to know if her breast could be made flatter.  We talked about the options for liposuction and fat filling.      Review of Systems  Constitutional: Negative.   HENT: Negative.    Eyes: Negative.   Respiratory: Negative.  Negative for chest tightness and shortness of breath.   Cardiovascular: Negative.   Gastrointestinal: Negative.   Endocrine: Negative.   Genitourinary: Negative.   Musculoskeletal: Negative.        Objective:   Physical Exam Constitutional:      Appearance: Normal appearance.  HENT:     Head: Normocephalic and atraumatic.  Cardiovascular:     Rate and Rhythm: Normal rate.     Pulses: Normal pulses.  Pulmonary:     Effort: Pulmonary effort is normal.  Abdominal:     General: There is no distension.     Palpations: Abdomen is soft.     Tenderness: There is no abdominal tenderness.  Skin:    General: Skin is warm.     Capillary Refill: Capillary refill takes less than 2 seconds.     Coloration: Skin is not jaundiced.     Findings: No bruising or lesion.  Neurological:     Mental Status: She is alert and oriented to person, place, and time.  Psychiatric:        Mood and Affect: Mood normal.        Behavior: Behavior normal.         Thought Content: Thought content normal.        Judgment: Judgment normal.       Assessment & Plan:     ICD-10-CM   1. Ductal carcinoma in situ (DCIS) of left breast  D05.12     2. Acquired absence of left breast  Z90.12        The patient does not want to do anything right now.  She knows that we are available if she changes her mind or if her physical condition changes and the left chest feels tight.  She knows to give [place] a call.  I did not take pictures today with her 2 sons present.

## 2022-10-19 ENCOUNTER — Other Ambulatory Visit (HOSPITAL_COMMUNITY): Payer: Self-pay

## 2022-10-24 ENCOUNTER — Other Ambulatory Visit (HOSPITAL_COMMUNITY): Payer: Self-pay

## 2022-11-20 ENCOUNTER — Other Ambulatory Visit (HOSPITAL_COMMUNITY): Payer: Self-pay

## 2022-12-31 ENCOUNTER — Other Ambulatory Visit (HOSPITAL_COMMUNITY): Payer: Self-pay

## 2023-01-02 ENCOUNTER — Other Ambulatory Visit (HOSPITAL_COMMUNITY): Payer: Self-pay

## 2023-01-15 ENCOUNTER — Ambulatory Visit: Payer: BC Managed Care – PPO

## 2023-01-22 ENCOUNTER — Encounter: Payer: Self-pay | Admitting: Plastic Surgery

## 2023-01-22 ENCOUNTER — Telehealth: Payer: Self-pay | Admitting: Plastic Surgery

## 2023-01-22 ENCOUNTER — Ambulatory Visit (INDEPENDENT_AMBULATORY_CARE_PROVIDER_SITE_OTHER): Payer: BC Managed Care – PPO | Admitting: Plastic Surgery

## 2023-01-22 VITALS — BP 112/70 | HR 83

## 2023-01-22 DIAGNOSIS — Z9012 Acquired absence of left breast and nipple: Secondary | ICD-10-CM | POA: Diagnosis not present

## 2023-01-22 NOTE — Telephone Encounter (Signed)
Pt son called and and stated that his mom would like to consider a tummy tuck, pt saw Dr Marla Roe today for a revision

## 2023-01-22 NOTE — Progress Notes (Signed)
   Subjective:    Patient ID: Yvette Franklin, female    DOB: 1971-05-23, 52 y.o.   MRN: [id number]  The patient is a 52 year old female here with her son for follow-up and evaluation of her breast.  She was diagnosed in May 2023 with left DCIS.  It was estrogen and progesterone negative.  She underwent a left mastectomy with implant-based reconstruction.  She had some issues and decided to have the expander removed and not to proceed with reconstruction.  She now complains of a little bit of discomfort and pain on the medial aspect of the left breast.  She would like to know if she can have some fat filling in order to help with the irregularities in the contour and may be even some of the pain.    Review of Systems  Constitutional: Negative.   HENT: Negative.    Eyes: Negative.   Respiratory: Negative.    Cardiovascular: Negative.   Gastrointestinal: Negative.   Endocrine: Negative.   Genitourinary: Negative.   Musculoskeletal: Negative.        Objective:   Physical Exam Vitals and nursing note reviewed.  Constitutional:      Appearance: Normal appearance.  HENT:     Head: Normocephalic and atraumatic.  Cardiovascular:     Rate and Rhythm: Normal rate.     Pulses: Normal pulses.  Pulmonary:     Effort: Pulmonary effort is normal.  Abdominal:     Palpations: Abdomen is soft.  Musculoskeletal:        General: Tenderness and deformity present. No swelling or signs of injury.  Skin:    Capillary Refill: Capillary refill takes less than 2 seconds.     Coloration: Skin is not jaundiced or pale.     Findings: No bruising or lesion.  Neurological:     Mental Status: She is alert and oriented to person, place, and time.  Psychiatric:        Mood and Affect: Mood normal.        Behavior: Behavior normal.        Thought Content: Thought content normal.        Judgment: Judgment normal.         Assessment & Plan:     ICD-10-CM   1. Acquired absence of left breast  Z90.12        I think is reasonable to try and do fat filling of the left breast in order to have better contour and less issues with keeping the area clean.

## 2023-01-25 ENCOUNTER — Ambulatory Visit: Payer: Self-pay | Admitting: Nurse Practitioner

## 2023-02-05 ENCOUNTER — Encounter: Payer: Self-pay | Admitting: *Deleted

## 2023-02-08 ENCOUNTER — Ambulatory Visit
Admission: RE | Admit: 2023-02-08 | Discharge: 2023-02-08 | Disposition: A | Discharge status: Home / Self Care | Payer: BC Managed Care – PPO | Source: Ambulatory Visit | Attending: Adult Health | Admitting: Adult Health

## 2023-02-08 DIAGNOSIS — Z1231 Encounter for screening mammogram for malignant neoplasm of breast: Secondary | ICD-10-CM | POA: Diagnosis not present

## 2023-02-09 ENCOUNTER — Telehealth: Payer: Self-pay | Admitting: Plastic Surgery

## 2023-02-09 NOTE — Telephone Encounter (Signed)
Son, Cordelia Pen, called to say patient has decided she only wants to have the tummy tuck. She does not want to have breast surgery. Please let her know the cost of the cosmetic tummy tuck and help her get that scheduled as soon as possible. Son [phone]

## 2023-02-10 NOTE — Telephone Encounter (Signed)
Returned call to Mr. Yvette Franklin, pt's son. Explained Dr. Marla Roe would have to provide the details for surgery in order for me to send a quote and I would ask her today when she is in clinic. Discussed in general how cosmetic quotes are broken down and where payments are made (our office, [facility], and [employer]). He is aware quote will be sent through Ms. Dicamillo's mychart

## 2023-02-22 ENCOUNTER — Encounter: Payer: Self-pay | Admitting: *Deleted

## 2023-02-23 ENCOUNTER — Inpatient Hospital Stay: Payer: BC Managed Care – PPO | Attending: Hematology and Oncology | Admitting: Hematology and Oncology

## 2023-02-23 NOTE — Assessment & Plan Note (Deleted)
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  Left mastectomy 04/22/22: HG DCIS, margins Neg, 0/4 LN Neg, ER 20%, PR 0% 2. Followed by antiestrogen therapy with tamoxifen 5 mg 5 years (based on TAM-01 clinical trial) discontinued 3.  Switched to anastrozole discontinued 06/16/2022 because of joint pains, switched to exemestane 07/31/2022: Persistent body pains: Discontinued antiestrogen therapy  Breast cancer surveillance: Breast exam 02/23/2023: Benign Mammogram right breast 02/09/2023: Benign breast density category B  Return to clinic in 1 year for follow-up with long-term survivorship clinic

## 2023-02-24 ENCOUNTER — Telehealth: Payer: Self-pay | Admitting: Plastic Surgery

## 2023-02-24 NOTE — Telephone Encounter (Signed)
No Auth Needed for Procedure of lipfilling of left breast.  [phone]

## 2023-03-01 ENCOUNTER — Ambulatory Visit (INDEPENDENT_AMBULATORY_CARE_PROVIDER_SITE_OTHER): Payer: BC Managed Care – PPO | Admitting: Nurse Practitioner

## 2023-03-01 ENCOUNTER — Encounter: Payer: Self-pay | Admitting: Nurse Practitioner

## 2023-03-01 VITALS — BP 129/64 | HR 81 | Temp 97.5°F | Ht 64.0 in | Wt 200.2 lb

## 2023-03-01 DIAGNOSIS — D0512 Intraductal carcinoma in situ of left breast: Secondary | ICD-10-CM | POA: Diagnosis not present

## 2023-03-01 DIAGNOSIS — Z13228 Encounter for screening for other metabolic disorders: Secondary | ICD-10-CM

## 2023-03-01 DIAGNOSIS — I10 Essential (primary) hypertension: Secondary | ICD-10-CM

## 2023-03-01 DIAGNOSIS — R140 Abdominal distension (gaseous): Secondary | ICD-10-CM

## 2023-03-01 DIAGNOSIS — Z1321 Encounter for screening for nutritional disorder: Secondary | ICD-10-CM | POA: Diagnosis not present

## 2023-03-01 DIAGNOSIS — Z1211 Encounter for screening for malignant neoplasm of colon: Secondary | ICD-10-CM

## 2023-03-01 DIAGNOSIS — E66811 Obesity, class 1: Secondary | ICD-10-CM

## 2023-03-01 DIAGNOSIS — Z23 Encounter for immunization: Secondary | ICD-10-CM | POA: Diagnosis not present

## 2023-03-01 DIAGNOSIS — E669 Obesity, unspecified: Secondary | ICD-10-CM | POA: Insufficient documentation

## 2023-03-01 DIAGNOSIS — Z0000 Encounter for general adult medical examination without abnormal findings: Secondary | ICD-10-CM | POA: Diagnosis not present

## 2023-03-01 DIAGNOSIS — Z1329 Encounter for screening for other suspected endocrine disorder: Secondary | ICD-10-CM

## 2023-03-01 DIAGNOSIS — Z130 Encounter for screening for diseases of the blood and blood-forming organs and certain disorders involving the immune mechanism: Secondary | ICD-10-CM

## 2023-03-01 HISTORY — DX: Obesity, class 1: E66.811

## 2023-03-01 MED ORDER — LOSARTAN POTASSIUM-HCTZ 100-12.5 MG PO TABS
1.0000 | ORAL_TABLET | Freq: Every day | ORAL | 1 refills | Status: DC
Start: 2023-03-01 — End: 2023-10-11

## 2023-03-01 NOTE — Assessment & Plan Note (Signed)
Patient educated on CDC recommendation for theshingles vaccine. Verbal consent was obtained from the patient, vaccine administered by nurse, no sign of adverse reactions noted at this time. Patient education on arm soreness and use of tylenol  for this patient  was discussed. Patient educated on the signs and symptoms of adverse effect and advise to contact the office if they occur.  

## 2023-03-01 NOTE — Progress Notes (Signed)
Complete physical exam  Patient: Yvette Franklin   DOB: 03/30/1971   52 y.o. Female  MRN: [id number]  Subjective:    Chief Complaint  Patient presents with   Establish Care    Yvette Franklin is a 52 y.o. female  has a past medical history of Breast cancer, Complication of anesthesia, Family history of GYN cancer (02/20/2022), Hypertension, and PONV (postoperative nausea and vomiting). who presents today for a complete physical exam and to establish care for her chronic medical conditions.  Patient was last seen in this office IN 09/2021 by Thad Ranger.. She reports consuming a general diet. The patient has a physically strenuous job, but has no regular exercise apart from work.  She generally feels well.  Patient complains of bloating.  She denies nausea, vomiting, blood in the stool, abdominal pain.  History of breast cancer.  Status post left mastectomy.  She has tried all options for antiestrogen therapy but could not continue due to side effects patient reports missing her last appointment with oncology she was encouraged to call the office and reschedule the appointment.  Her last mammogram in March was normal with recommendation for repeat in a year.  Due for shingles vaccine, Tdap vaccine need for both vaccines discussed, vaccines were administered in the office today.  Cologuard ordered to screen for colon cancer.     03/01/2023   10:55 AM  Fall Risk   Falls in the past year? 0  Number falls in past yr: 0  Injury with Fall? 0  Risk for fall due to : No Fall Risks  Follow up Falls evaluation completed     Most recent depression screenings:    03/01/2023   10:56 AM 12/30/2020   11:03 AM  PHQ 2/9 Scores  PHQ - 2 Score 0 0  PHQ- 9 Score 0         Patient Care Team: Donell Beers, FNP as PCP - General (Nurse Practitioner) Griselda Miner, MD as Consulting Physician (General Surgery) Serena Croissant, MD as Consulting Physician (Hematology and Oncology) Dorothy Puffer,  MD as Consulting Physician (Radiation Oncology)   Outpatient Medications Prior to Visit  Medication Sig   Multiple Vitamin (CALCIUM COMPLEX PO) Take by mouth.   [DISCONTINUED] losartan-hydrochlorothiazide (HYZAAR) 100-12.5 MG tablet Take 1 tablet by mouth daily.   No facility-administered medications prior to visit.    Review of Systems  Constitutional: Negative.  Negative for chills, fever, malaise/fatigue and weight loss.  HENT: Negative.    Eyes:  Negative for pain, discharge and redness.  Respiratory: Negative.  Negative for cough, hemoptysis, sputum production and shortness of breath.   Cardiovascular:  Negative for chest pain, palpitations, orthopnea and claudication.  Gastrointestinal:  Negative for abdominal pain, diarrhea, heartburn, nausea and vomiting.  Genitourinary:  Negative for dysuria, frequency and urgency.  Musculoskeletal: Negative.  Negative for back pain, myalgias and neck pain.  Skin: Negative.   Neurological: Negative.   Endo/Heme/Allergies: Negative.   Psychiatric/Behavioral: Negative.            Objective:     BP 129/64   Pulse 81   Temp (!) 97.5 F (36.4 C)   Ht  (1.626 m)   Wt 200 lb 3.2 oz (90.8 kg)   LMP 11/15/2012 (Approximate)   SpO2 99%   BMI 34.36 kg/m    Physical Exam Constitutional:      General: She is not in acute dSORAIYA AHNER  Appearance: Normal appearance. She is obese.  She is not ill-appearing, toxic-appearing or diaphoretic.  HENT:     Right Ear: Tympanic membrane, ear canal and external ear normal. There is no impacted cerumen.     Left Ear: Tympanic membrane and external ear normal. There is no impacted cerumen.     Nose: Nose normal. No congestion or rhinorrhea.     Mouth/Throat:     Mouth: Mucous membranes are moist.     Pharynx: Oropharynx is clear. No oropharyngeal exudate or posterior oropharyngeal erythema.  Eyes:     General: No scleral icterus.       Right eye: No discharge.        Left eye: No discharge.      Conjunctiva/sclera: Conjunctivae normal.  Neck:     Vascular: No carotid bruit.  Cardiovascular:     Rate and Rhythm: Normal rate and regular rhythm.     Pulses: Normal pulses.     Heart sounds: Normal heart sounds. No murmur heard.    No friction rub. No gallop.  Pulmonary:     Effort: Pulmonary effort is normal. No respiratory distress.     Breath sounds: Normal breath sounds. No stridor. No wheezing, rhonchi or rales.  Chest:     Chest wall: No tenderness.  Abdominal:     General: There is no distension.     Palpations: Abdomen is soft. There is no mass.     Tenderness: There is no abdominal tenderness. There is no right CVA tenderness, left CVA tenderness, guarding or rebound.     Hernia: No hernia is present.  Musculoskeletal:        General: No swelling, tenderness, deformity or signs of injury.     Cervical back: Normal range of motion. No rigidity or tenderness.     Right lower leg: No edema.     Left lower leg: No edema.  Lymphadenopathy:     Cervical: No cervical adenopathy.  Skin:    General: Skin is warm and dry.     Capillary Refill: Capillary refill takes less than 2 seconds.     Coloration: Skin is not jaundiced or pale.     Findings: No bruising, erythema, lesion or rash.  Neurological:     Mental Status: She is alert and oriented to person, place, and time.     Cranial Nerves: No cranial nerve deficit.     Sensory: No sensory deficit.     Motor: No weakness.     Coordination: Coordination normal.     Gait: Gait normal.     Deep Tendon Reflexes: Reflexes normal.  Psychiatric:        Mood and Affect: Mood normal.        Behavior: Behavior normal.        Thought Content: Thought content normal.        Judgment: Judgment normal.      No results found for any visits on 03/01/23.     Assessment & Plan:    Routine Health Maintenance and Physical Exam  Immunization History  Administered Date(s) Administered   PFIZER(Purple Top)SARS-COV-2  Vaccination 09/24/2020, 10/15/2020   Td 01/05/2008    Health Maintenance  Topic Date Due   Hepatitis C Screening  Never done   Zoster Vaccines- Shingrix (1 of 2) Never done   COLONOSCOPY (Pts 45-105yrs Insurance coverage will need to be confirmed)  Never done   DTaP/Tdap/Td (2 - Tdap) 01/04/2018   COVID-19 Vaccine (3 - Pfizer risk series) 11/12/2020   PAP SMEAR-Modifier  09/25/2024  MAMMOGRAM  02/07/2025   HIV Screening  Completed   HPV VACCINES  Aged Out    Discussed health benefits of physical activity, and encouraged her to engage in regular exercise appropriate for her age and condition.  Problem List Items Addressed This Visit       Cardiovascular and Mediastinum   Essential hypertension    BP Readings from Last 3 Encounters:  03/01/23 129/64  01/22/23 112/70  09/25/22 118/78  Currently on losartan-hydrochlorothiazide 100-2.5 mg 1 tablet daily, medication refilled  HTN Controlled . Continue current medications. No changes in management. Discussed DASH diet and dietary sodium restrictions Continue to increase dietary efforts and exercise.  CMP today      Relevant Medications   losartan-hydrochlorothiazide (HYZAAR) 100-12.5 MG tablet   Other Relevant Orders   CMP14+EGFR     Other   Ductal carcinoma in situ (DCIS) of left breast    Status post left breast mastectomy, she is up-to-date on her mammogram.  Patient encouraged to follow-up with oncology as planned.  She declined breast exam today      Obesity (BMI 30.0-34.9)    Wt Readings from Last 3 Encounters:  03/01/23 200 lb 3.2 oz (90.8 kg)  08/24/22 204 lb (92.5 kg)  06/30/22 197 lb 12.8 oz (89.7 kg)   Body mass index is 34.36 kg/m.  Patient counseled on low-carb modified diet, she was encouraged to engage in regular moderate to vigorous exercise at least 150 minutes weekly as tolerated. Benefits of healthy weight discussed      Abdominal bloating    Patient encouraged to avoid gas producing foods like  broccoli carbonated drinks  beans , avoid overeating      Need for diphtheria-tetanus-pertussis (Tdap) vaccine    Patient educated on CDC recommendation for the TDAP vaccine. Verbal consent was obtained from the patient, vaccine administered by nurse, no sign of adverse reactions noted at this time. Patient education on arm soreness and use of tylenol for this patient  was discussed. Patient educated on the signs and symptoms of adverse effect and advise to contact the office if they occur.       Need for shingles vaccine    Patient educated on CDC recommendation for the shingles vaccine. Verbal consent was obtained from the patient, vaccine administered by nurse, no sign of adverse reactions noted at this time. Patient education on arm soreness and use of tylenol for this patient  was discussed. Patient educated on the signs and symptoms of adverse effect and advise to contact the office if they occur.       Annual physical exam - Primary    Annual exam as documented.  Counseling done include healthy lifestyle involving committing to 150 minutes of exercise per week, heart healthy diet, and attaining healthy weight. The importance of adequate sleep also discussed.  Regular use of seat belt and home safety were also discussed . Changes in health habits are decided on by patient with goals and time frames set for achieving them. Immunization and cancer screening  needs are specifically addressed at this visit.    Fasting labs ordered, shingles and Tdap vaccine given.  Cologuard ordered to screen for colon cancer.  Up-to-date with Pap and mammogram      Other Visit Diagnoses     Screening for colon cancer       Relevant Orders   Cologuard   Screening for endocrine, nutritional, metabolic and immunity disorder       Relevant Orders  CMP14+EGFR   CBC with Differential   TSH   Vitamin D, 25-hydroxy   Hemoglobin A1c   Lipid Panel   Hepatitis C antibody      Return in about 6 months  (around 08/31/2023) for HTN.     Donell Beers, FNP

## 2023-03-01 NOTE — Patient Instructions (Addendum)
[facility] Medical Oncology [phone]   It is important that you exercise regularly at least 30 minutes 5 times a week as tolerated  Think about what you will eat, plan ahead. Choose " clean, green, fresh or frozen" over canned, processed or packaged foods which are more sugary, salty and fatty. 70 to 75% of food eaten should be vegetables and fruit. Three meals at set times with snacks allowed between meals, but they must be fruit or vegetables. Aim to eat over a 12 hour period , example 7 am to 7 pm, and STOP after  your last meal of the day. Drink water,generally about 64 ounces per day, no other drink is as healthy. Fruit juice is best enjoyed in a healthy way, by EATING the fruit.  Thanks for choosing [facility] we consider it a privelige to serve you.

## 2023-03-01 NOTE — Assessment & Plan Note (Addendum)
Patient encouraged to avoid gas producing foods like broccoli carbonated drinks  beans , avoid overeating

## 2023-03-01 NOTE — Addendum Note (Signed)
Addended byRaj Janus on: 03/01/2023 12:05 PM   Modules accepted: Orders

## 2023-03-01 NOTE — Assessment & Plan Note (Signed)
Annual exam as documented.  Counseling done include healthy lifestyle involving committing to 150 minutes of exercise per week, heart healthy diet, and attaining healthy weight. The importance of adequate sleep also discussed.  Regular use of seat belt and home safety were also discussed . Changes in health habits are decided on by patient with goals and time frames set for achieving them. Immunization and cancer screening  needs are specifically addressed at this visit.    Fasting labs ordered, shingles and Tdap vaccine given.  Cologuard ordered to screen for colon cancer.  Up-to-date with Pap and mammogram

## 2023-03-01 NOTE — Assessment & Plan Note (Signed)
Wt Readings from Last 3 Encounters:  03/01/23 200 lb 3.2 oz (90.8 kg)  08/24/22 204 lb (92.5 kg)  06/30/22 197 lb 12.8 oz (89.7 kg)   Body mass index is 34.36 kg/m.  Patient counseled on low-carb modified diet, she was encouraged to engage in regular moderate to vigorous exercise at least 150 minutes weekly as tolerated. Benefits of healthy weight discussed

## 2023-03-01 NOTE — Assessment & Plan Note (Signed)
BP Readings from Last 3 Encounters:  03/01/23 129/64  01/22/23 112/70  09/25/22 118/78   Currently on losartan-hydrochlorothiazide 100-2.5 mg 1 tablet daily, medication refilled  HTN Controlled . Continue current medications. No changes in management. Discussed DASH diet and dietary sodium restrictions Continue to increase dietary efforts and exercise.  CMP today

## 2023-03-01 NOTE — Assessment & Plan Note (Addendum)
Status post left breast mastectomy, she is up-to-date on her mammogram.  Patient encouraged to follow-up with oncology as planned.  She declined breast exam today

## 2023-03-01 NOTE — Assessment & Plan Note (Signed)
Patient educated on CDC recommendation for the TDAP vaccine. Verbal consent was obtained from the patient, vaccine administered by nurse, no sign of adverse reactions noted at this time. Patient education on arm soreness and use of tylenol for this patient  was discussed. Patient educated on the signs and symptoms of adverse effect and advise to contact the office if they occur.  

## 2023-03-02 LAB — CBC WITH DIFFERENTIAL/PLATELET
Basophils Absolute: 0 10*3/uL (ref 0.0–0.2)
Basos: 1 %
EOS (ABSOLUTE): 0 10*3/uL (ref 0.0–0.4)
Eos: 1 %
Hematocrit: 41.8 % (ref 34.0–46.6)
Hemoglobin: 13.8 g/dL (ref 11.1–15.9)
Immature Grans (Abs): 0 10*3/uL (ref 0.0–0.1)
Immature Granulocytes: 0 %
Lymphocytes Absolute: 2.3 10*3/uL (ref 0.7–3.1)
Lymphs: 40 %
MCH: 29.4 pg (ref 26.6–33.0)
MCHC: 33 g/dL (ref 31.5–35.7)
MCV: 89 fL (ref 79–97)
Monocytes Absolute: 0.3 10*3/uL (ref 0.1–0.9)
Monocytes: 6 %
Neutrophils Absolute: 3 10*3/uL (ref 1.4–7.0)
Neutrophils: 52 %
Platelets: 275 10*3/uL (ref 150–450)
RBC: 4.7 x10E6/uL (ref 3.77–5.28)
RDW: 12.7 % (ref 11.7–15.4)
WBC: 5.7 10*3/uL (ref 3.4–10.8)

## 2023-03-02 LAB — CMP14+EGFR
ALT: 14 IU/L (ref 0–32)
AST: 19 IU/L (ref 0–40)
Albumin/Globulin Ratio: 1.4 (ref 1.2–2.2)
Albumin: 4.3 g/dL (ref 3.8–4.9)
Alkaline Phosphatase: 70 IU/L (ref 44–121)
BUN/Creatinine Ratio: 14 (ref 9–23)
BUN: 13 mg/dL (ref 6–24)
Bilirubin Total: 0.2 mg/dL (ref 0.0–1.2)
CO2: 25 mmol/L (ref 20–29)
Calcium: 9.7 mg/dL (ref 8.7–10.2)
Chloride: 106 mmol/L (ref 96–106)
Creatinine, Ser: 0.95 mg/dL (ref 0.57–1.00)
Globulin, Total: 3 g/dL (ref 1.5–4.5)
Glucose: 97 mg/dL (ref 70–99)
Potassium: 3.8 mmol/L (ref 3.5–5.2)
Sodium: 142 mmol/L (ref 134–144)
Total Protein: 7.3 g/dL (ref 6.0–8.5)
eGFR: 72 mL/min/{1.73_m2} (ref >59)

## 2023-03-02 LAB — HEMOGLOBIN A1C
Est. average glucose Bld gHb Est-mCnc: 117 mg/dL
Hgb A1c MFr Bld: 5.7 % — ABNORMAL HIGH (ref 4.8–5.6)

## 2023-03-02 LAB — LIPID PANEL
Chol/HDL Ratio: 4.1 ratio (ref 0.0–4.4)
Cholesterol, Total: 218 mg/dL — ABNORMAL HIGH (ref 100–199)
HDL: 53 mg/dL (ref >39)
LDL Chol Calc (NIH): 150 mg/dL — ABNORMAL HIGH (ref 0–99)
Triglycerides: 87 mg/dL (ref 0–149)
VLDL Cholesterol Cal: 15 mg/dL (ref 5–40)

## 2023-03-02 LAB — VITAMIN D 25 HYDROXY (VIT D DEFICIENCY, FRACTURES): Vit D, 25-Hydroxy: 35.6 ng/mL (ref 30.0–100.0)

## 2023-03-02 LAB — HEPATITIS C ANTIBODY: Hep C Virus Ab: NONREACTIVE

## 2023-03-02 LAB — TSH: TSH: 1.92 u[IU]/mL (ref 0.450–4.500)

## 2023-03-02 NOTE — Progress Notes (Signed)
Prediabetes avoid sugar sweets soda.   Hyperlipidemia Eat a healthy diet, including lots of fruits and vegetables. Avoid foods with a lot of saturated and trans fats, such as red meat, butter, fried foods and cheese . Maintain a healthy weight.   Other labs are normal.    The 10-year ASCVD risk score (Arnett DK, et al., 2019) is: 4.7%   Values used to calculate the score:     Age: 52 years     Sex: Female     Is Non-Hispanic African American: Yes     Diabetic: No     Tobacco smoker: No     Systolic Blood Pressure: 129 mmHg     Is BP treated: Yes     HDL Cholesterol: 53 mg/dL     Total Cholesterol: 218 mg/dL

## 2023-03-07 DIAGNOSIS — Z1211 Encounter for screening for malignant neoplasm of colon: Secondary | ICD-10-CM | POA: Diagnosis not present

## 2023-03-09 ENCOUNTER — Telehealth: Payer: Self-pay

## 2023-03-09 NOTE — Telephone Encounter (Addendum)
Called patient, left a message on voicemail inquiring if she had any question in regards to the quote Joss sent about the abdominoplasty, or if she ready to schedule the procedure.  If so, please call our office at your earliest convenience.  Sent message via Mychart as well.

## 2023-03-15 LAB — COLOGUARD: COLOGUARD: NEGATIVE

## 2023-04-01 ENCOUNTER — Telehealth: Payer: Self-pay | Admitting: *Deleted

## 2023-04-01 NOTE — Telephone Encounter (Signed)
LVM TO SCHED SX 

## 2023-05-03 ENCOUNTER — Telehealth: Payer: Self-pay | Admitting: *Deleted

## 2023-05-03 NOTE — Telephone Encounter (Signed)
LVM to schedule surgery

## 2023-05-06 ENCOUNTER — Telehealth: Payer: Self-pay | Admitting: *Deleted

## 2023-05-06 NOTE — Telephone Encounter (Signed)
Spoke with patients son who states patient is no longer interested in the surgery and will contact us if she changes her mind.

## 2023-05-06 NOTE — Telephone Encounter (Signed)
LVM to schedule surgery - 3rd and final attempt

## 2023-06-06 IMAGING — MR MR BREAST BILAT WO/W CM
8 of 12 series · 29 of 48 positions shown · IV contrast (10 ml gadavist)
Comparison: Prior exams
COMPARISON: Prior exams

Addendum:
CLINICAL DATA: Recent diagnosis of high-grade DCIS in the left
breast asymmetries. On diagnostic imaging, performed on 01/27/2022,
probably benign microcysts were noted on the right corresponding to
the screening abnormality. On the left, the asymmetry was felt be
due to asymmetric normal fibroglandular tissue. There were
interspersed normal intramammary lymph nodes on ultrasound. However,
faint linear calcifications were also detected in the upper outer
left breast. These underwent stereotactic core needle biopsy on
02/09/2022, which revealed DCIS, intermediate to focally high-grade.
See subsequently underwent ultrasound-guided core needle biopsy of
the right breast lesion at 3 o'clock, 4 cm the nipple, pathology
revealing fibrocystic change with apocrine metaplasia, without
malignancy or atypia. Current exam is for extent of disease.

EXAM:
BILATERAL BREAST MRI WITH AND WITHOUT CONTRAST
TECHNIQUE: Multiplanar, multisequence MR images of both breasts were obtained
prior to and following the intravenous administration of ml of
Gadavist

[Series 2: t2_tirm_tra ipat (a-p) · axial · 3.0mm · 0.70mm/px · 1 of 55 slices shown]
[im 1/55]
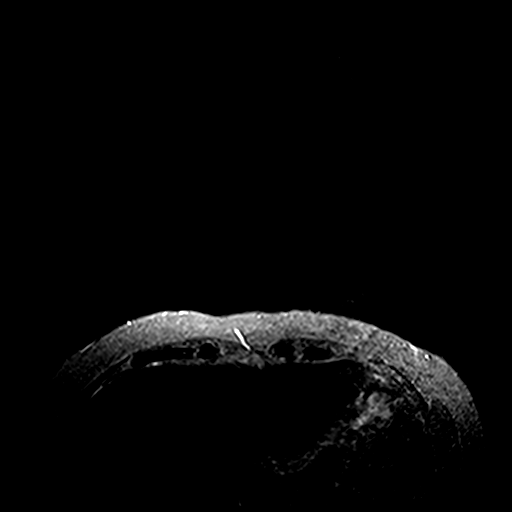

[Series 3: fl3d pre-cm no · axial · non-contrast · 1.2mm · 0.89mm/px · z∈[-78,+93]mm · 5 of 144 slices shown]
[im 1/144]
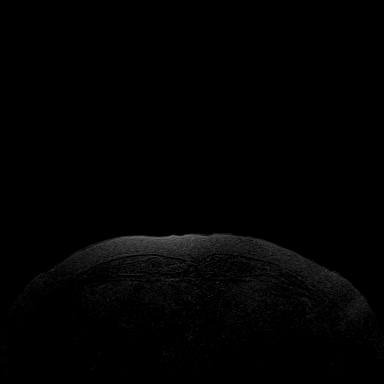
[im 36/144]
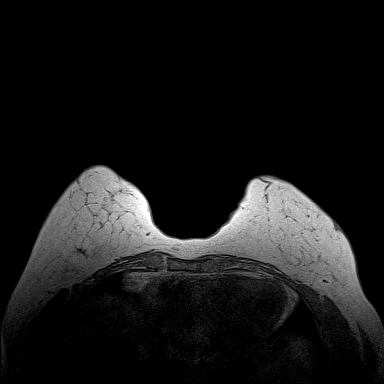
[im 72/144]
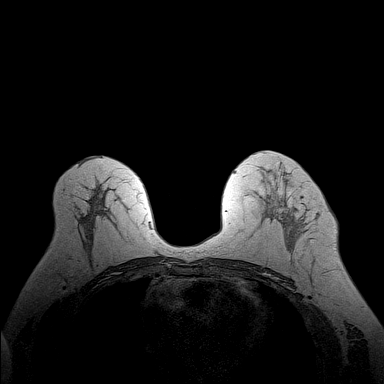
[im 108/144]
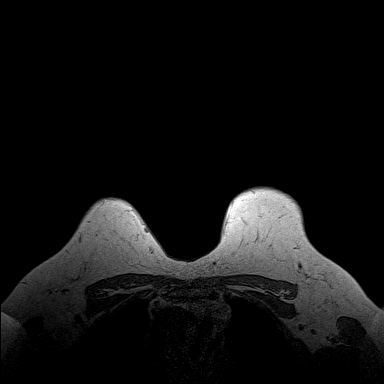
[im 144/144]
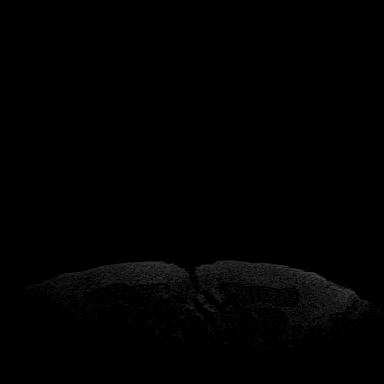

[Series 4: fl3d pre-cm · axial · non-contrast · 1.2mm · 0.89mm/px · z∈[-78,+93]mm · 5 of 144 slices shown]
[im 1/144]
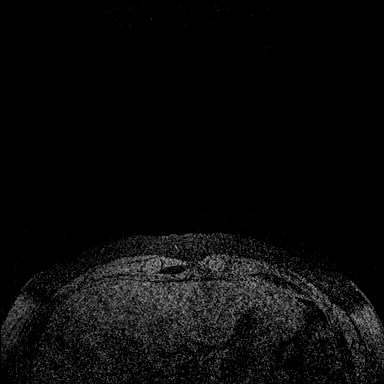
[im 36/144]
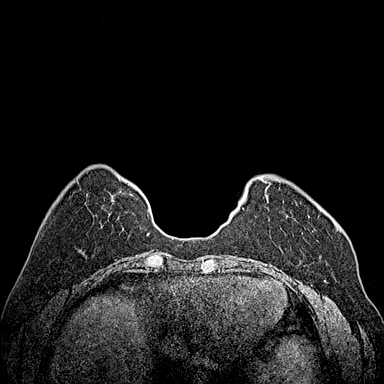
[im 72/144]
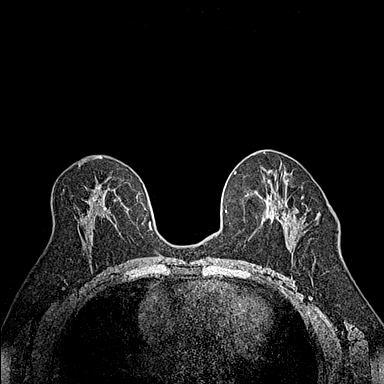
[im 108/144]
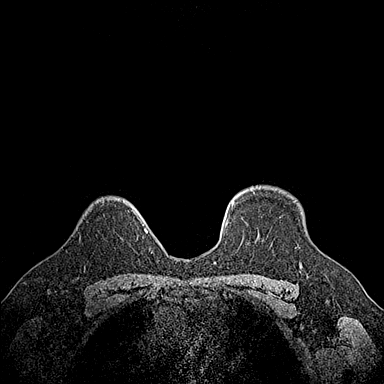
[im 144/144]
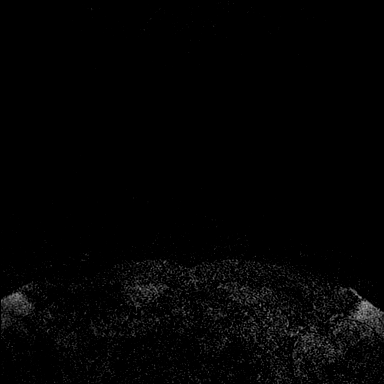

[Series 5: fl3d post-cm 20 · axial · 1.2mm · 0.89mm/px · z∈[-78,+93]mm · 5 of 144 slices shown (1 of 3)]
[im 1/144]
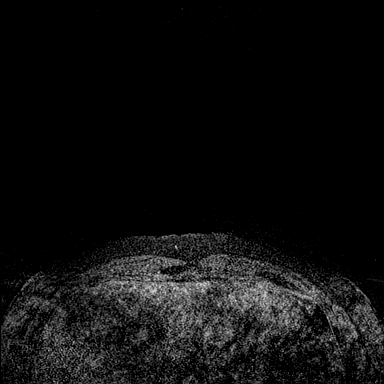
[im 36/144]
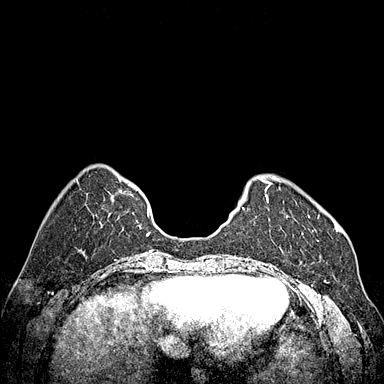
[im 72/144]
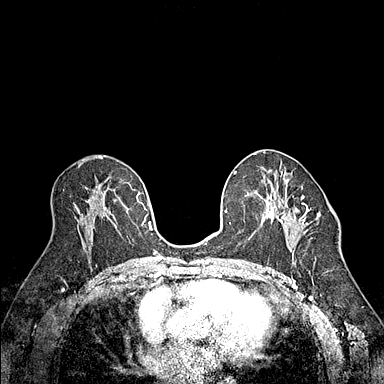
[im 108/144]
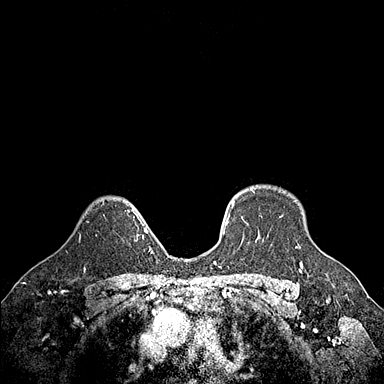
[im 144/144]
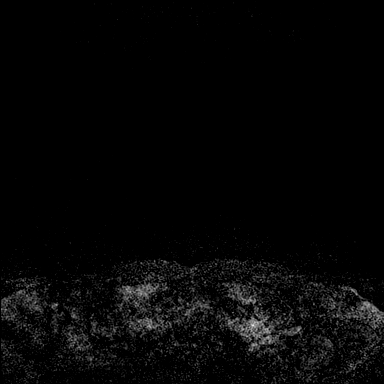

[Series 6: fl3d post-cm 20 · axial · 1.2mm · 0.89mm/px · z∈[-78,+93]mm · 5 of 144 slices shown (2 of 3)]
[im 1/144]
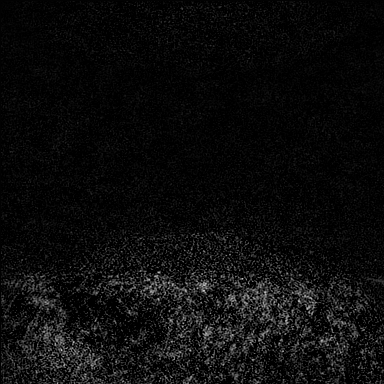
[im 36/144]
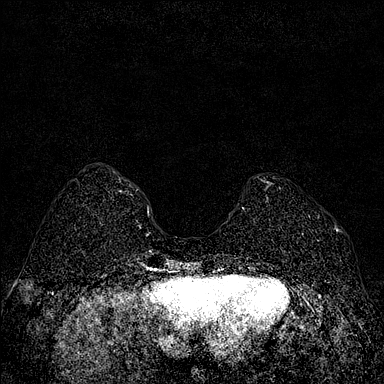
[im 72/144]
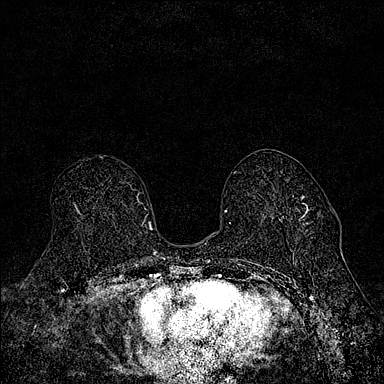
[im 108/144]
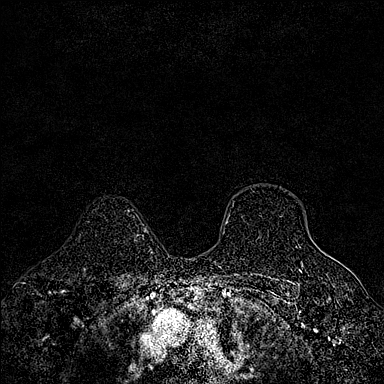
[im 144/144]
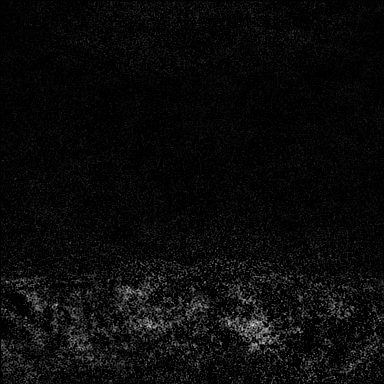

[Series 7: fl3d post-cm 20 · axial · 172.8mm · 0.89mm/px · 1 of 1 slices shown (3 of 3)]
[im 1/1]
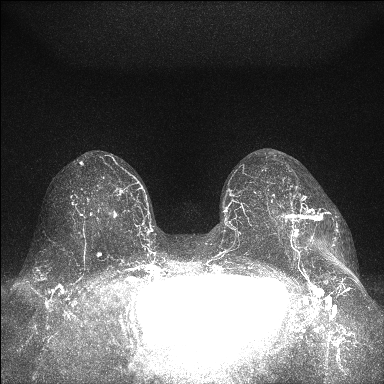

[Series 8: fl3d post-cm 3 · axial · 1.2mm · 0.89mm/px · z∈[-78,+93]mm · 6 of 144 slices shown (1 of 2)]
[im 1/144]
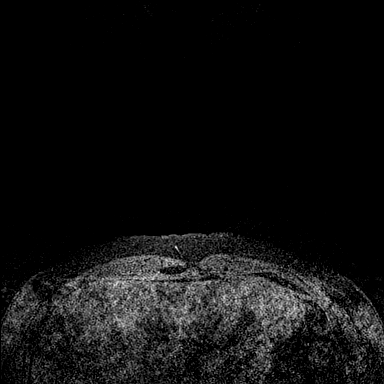
[im 29/144]
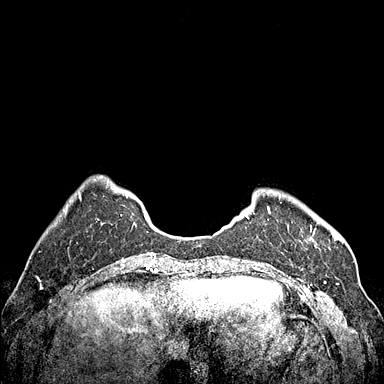
[im 58/144]
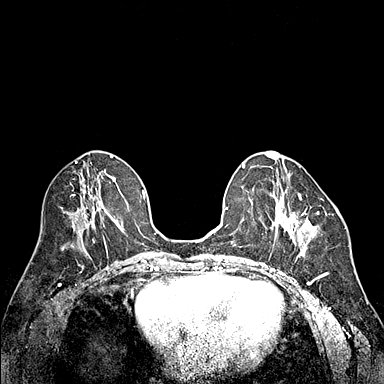
[im 86/144]
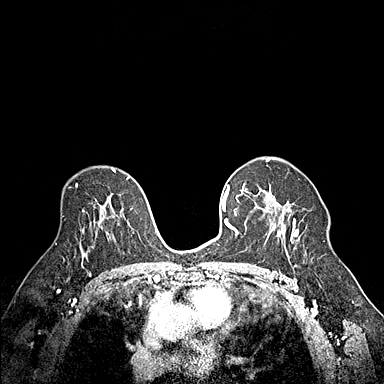
[im 115/144]
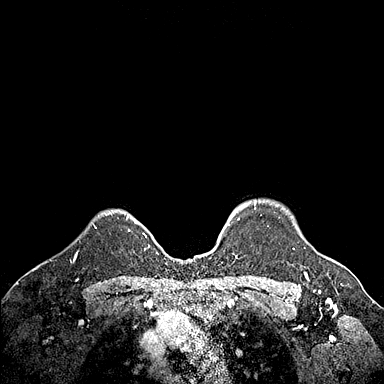
[im 144/144]
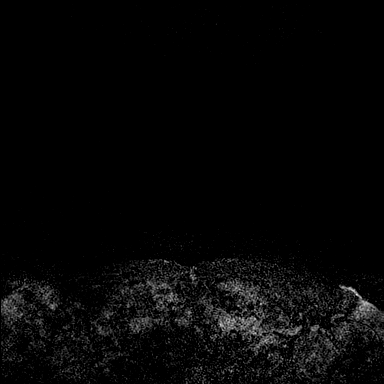

[Series 9: fl3d post-cm 3 · axial · 1.2mm · 0.89mm/px · 1 of 144 slices shown (2 of 2)]
[im 1/144]
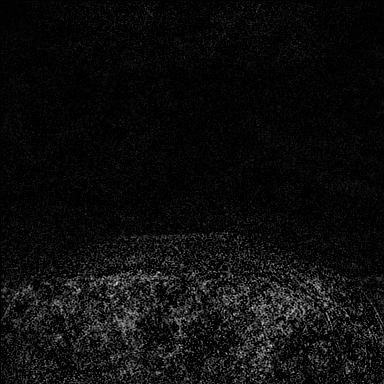

[29 of 48 positions shown; findings below may reference images not displayed]

Three-dimensional MR images were rendered by post-processing of the
original MR data on an independent workstation. The
three-dimensional MR images were interpreted, and findings are
reported in the following complete MRI report for this study. Three
dimensional images were evaluated at the independent interpreting
workstation using the DynaCAD thin client.
FINDINGS: Breast composition: b. Scattered fibroglandular tissue.

Background parenchymal enhancement: Mild

Right breast: No mass or abnormal enhancement. Biopsy clip artifact
in medial breast.

Left breast: Enhancement extends along biopsy tract in the lateral
left breast. There is low level non mass enhancement extends
anterior and posterior to the biopsy site, with progressive
enhancement kinetics, spanning 7.0 x 1.6 x 1.7 cm. There are no
masses. No other areas of abnormal enhancement.

Lymph nodes: No abnormal appearing lymph nodes.

Ancillary findings:  None.
IMPRESSION: 1. Recent diagnosis of left breast DCIS. There is low level non mass
enhancement in a linear distribution, extending anterior and
posterior to the biopsy site, spanning a total of 7 cm in long axis.
This could reflect additional DCIS. This lies in the lateral left
breast.
2. No evidence of right breast malignancy.
3. No abnormal lymph nodes.

RECOMMENDATION:
1. Recommend 2 MRI guided left breast core needle biopsies of the
low level non mass enhancement, anterior and posterior to the site
of biopsy-proven DCIS.

BI-RADS CATEGORY  4: Suspicious.

ADDENDUM:
The dosage of Gadavist was 10 mL.

*** End of Addendum ***
Three-dimensional MR images were rendered by post-processing of the
original MR data on an independent workstation. The
three-dimensional MR images were interpreted, and findings are
reported in the following complete MRI report for this study. Three
dimensional images were evaluated at the independent interpreting
workstation using the DynaCAD thin client.
FINDINGS: Breast composition: b. Scattered fibroglandular tissue.

Background parenchymal enhancement: Mild

Right breast: No mass or abnormal enhancement. Biopsy clip artifact
in medial breast.

Left breast: Enhancement extends along biopsy tract in the lateral
left breast. There is low level non mass enhancement extends
anterior and posterior to the biopsy site, with progressive
enhancement kinetics, spanning 7.0 x 1.6 x 1.7 cm. There are no
masses. No other areas of abnormal enhancement.

Lymph nodes: No abnormal appearing lymph nodes.

Ancillary findings:  None.
IMPRESSION: 1. Recent diagnosis of left breast DCIS. There is low level non mass
enhancement in a linear distribution, extending anterior and
posterior to the biopsy site, spanning a total of 7 cm in long axis.
This could reflect additional DCIS. This lies in the lateral left
breast.
2. No evidence of right breast malignancy.
3. No abnormal lymph nodes.

RECOMMENDATION:
1. Recommend 2 MRI guided left breast core needle biopsies of the
low level non mass enhancement, anterior and posterior to the site
of biopsy-proven DCIS.

BI-RADS CATEGORY  4: Suspicious.

## 2023-07-18 ENCOUNTER — Emergency Department (HOSPITAL_BASED_OUTPATIENT_CLINIC_OR_DEPARTMENT_OTHER)
Admission: EM | Admit: 2023-07-18 | Discharge: 2023-07-18 | Disposition: A | Discharge status: Home / Self Care | Payer: BC Managed Care – PPO | Attending: Emergency Medicine | Admitting: Emergency Medicine

## 2023-07-18 ENCOUNTER — Other Ambulatory Visit: Payer: Self-pay

## 2023-07-18 ENCOUNTER — Encounter (HOSPITAL_BASED_OUTPATIENT_CLINIC_OR_DEPARTMENT_OTHER): Payer: Self-pay | Admitting: Emergency Medicine

## 2023-07-18 DIAGNOSIS — Z853 Personal history of malignant neoplasm of breast: Secondary | ICD-10-CM | POA: Diagnosis not present

## 2023-07-18 DIAGNOSIS — U071 COVID-19: Secondary | ICD-10-CM | POA: Insufficient documentation

## 2023-07-18 DIAGNOSIS — R0981 Nasal congestion: Secondary | ICD-10-CM | POA: Diagnosis not present

## 2023-07-18 LAB — RESP PANEL BY RT-PCR (RSV, FLU A&B, COVID)  RVPGX2
Influenza A by PCR: NEGATIVE
Influenza B by PCR: NEGATIVE
Resp Syncytial Virus by PCR: NEGATIVE
SARS Coronavirus 2 by RT PCR: POSITIVE — AB

## 2023-07-18 LAB — GROUP A STREP BY PCR: Group A Strep by PCR: NOT DETECTED

## 2023-07-18 MED ORDER — LIDOCAINE VISCOUS HCL 2 % MT SOLN: 15.0000 mL | OROMUCOSAL | 0 refills | Status: AC | PRN

## 2023-07-18 MED ORDER — LIDOCAINE VISCOUS HCL 2 % MT SOLN
15.0000 mL | Freq: Once | OROMUCOSAL | Status: AC
  Administered 2023-07-18: 15 mL via OROMUCOSAL
  Filled 2023-07-18: qty 15

## 2023-07-18 MED ORDER — BENZONATATE 100 MG PO CAPS: 100.0000 mg | ORAL_CAPSULE | Freq: Three times a day (TID) | ORAL | 0 refills | Status: AC | PRN

## 2023-07-18 NOTE — Discharge Instructions (Addendum)
Discussed, you are positive for COVID.  Sent a prescription of viscous lidocaine to your pharmacy you can take as needed for sore throat.  I have also sent a prescription of benzonatate capsules to your pharmacy you can take this up to 3 times a day for cough.  Continue taking your medication for mucus and allergy medications.  Please follow-up with your PCP in the next 5 to 7 days for reevaluation of your symptoms.  Get help right away if: You have trouble breathing or get short of breath. You have pain or pressure in your chest. You cannot speak or move any part of your body. You are confused. Your symptoms get worse.

## 2023-07-18 NOTE — ED Triage Notes (Signed)
Pt c/o congestion and fatigue x 1 wk; taking OTC meds for mucous, but is not improving

## 2023-07-18 NOTE — ED Provider Notes (Signed)
[facility] Provider Note   CSN: [id number] Arrival date & time: 07/18/23  1557     History  Chief Complaint  Patient presents with   Nasal Congestion    Yvette Franklin is a 52 y.o. female with a history of breast cancer, cataracts, vertigo who presents the ED today for sick symptoms.  Patient reports congestion cough, sore throat, and fatigue for 1 week.  Patient reports that she is taking "mucus medication," allergy medication, and Tylenol for intermittent headaches.  She reports that her symptoms wax and wane, some days she feels better than others.  She gets chills but has not had a fever.  Denies nausea, vomiting, or diarrhea.    Home Medications Prior to Admission medications   Medication Sig Start Date End Date Taking? Authorizing Provider  benzonatate (TESSALON) 100 MG capsule Take 1 capsule (100 mg total) by mouth 3 (three) times daily as needed for up to 14 days for cough. 07/18/23 08/01/23 Yes Maxwell Marion, PA-C  lidocaine (XYLOCAINE) 2 % solution Use as directed 15 mLs in the mouth or throat as needed for up to 10 days for mouth pain. 07/18/23 07/28/23 Yes Maxwell Marion, PA-C  losartan-hydrochlorothiazide (HYZAAR) 100-12.5 MG tablet Take 1 tablet by mouth daily. 03/01/23   Donell Beers, FNP  Multiple Vitamin (CALCIUM COMPLEX PO) Take by mouth.    [provider]      Allergies    Pork-derived products    Review of Systems   Review of Systems  HENT:  Positive for congestion.   All other systems reviewed and are negative.   Physical Exam Updated Vital Signs BP (!) 151/95   Pulse 84   Temp 97.9 F (36.6 C) (Oral)   Resp 20   Ht 5\' 4"  (1.626 m)   Wt 84.4 kg   LMP 11/15/2012 (Approximate)   SpO2 100%   BMI 31.93 kg/m  Physical Exam Vitals and nursing note reviewed.  Constitutional:      Appearance: Normal appearance.  HENT:     Head: Normocephalic and atraumatic.     Right Ear: Tympanic membrane, ear  canal and external ear normal.     Left Ear: Tympanic membrane, ear canal and external ear normal.     Mouth/Throat:     Mouth: Mucous membranes are moist.     Pharynx: No oropharyngeal exudate or posterior oropharyngeal erythema.  Eyes:     Conjunctiva/sclera: Conjunctivae normal.     Pupils: Pupils are equal, round, and reactive to light.  Cardiovascular:     Rate and Rhythm: Normal rate and regular rhythm.     Pulses: Normal pulses.     Heart sounds: Normal heart sounds.  Pulmonary:     Effort: Pulmonary effort is normal.     Breath sounds: Normal breath sounds.  Abdominal:     Palpations: Abdomen is soft.     Tenderness: There is no abdominal tenderness.  Skin:    General: Skin is warm and dry.     Findings: No rash.  Neurological:     General: No focal deficit present.     Mental Status: She is alert.  Psychiatric:        Mood and Affect: Mood normal.        Behavior: Behavior normal.     ED Results / Procedures / Treatments   Labs (all labs ordered are listed, but only abnormal results are displayed) Labs Reviewed  RESP PANEL BY RT-PCR (  RSV, FLU A&B, COVID)  [id number] - Abnormal; Notable for the following components:      Result Value   SARS Coronavirus 2 by RT PCR POSITIVE (*)    All other components within normal limits  GROUP A STREP BY PCR    EKG None  Radiology No results found.  Procedures Procedures: not indicated.   Medications Ordered in ED Medications  lidocaine (XYLOCAINE) 2 % viscous mouth solution 15 mL (15 mLs Mouth/Throat Given 07/18/23 1742)    ED Course/ Medical Decision Making/ A&P                                 Medical Decision Making Risk Prescription drug management.   This patient presents to the ED for concern of congestion and fatigue, this involves an extensive number of treatment options, and is a complaint that carries with it a high risk of complications and morbidity.   Differential diagnosis includes: COVID, flu, RSV,  strep throat, URI, etc.   Comorbidities  See HPI above   Additional History  Additional history obtained from previous records.   Lab Tests  I ordered and personally interpreted labs.  The pertinent results include:   Positive for COVID   Problem List / ED Course / Critical Interventions / Medication Management  Congestion, sore throat and fatigue I ordered medications including: Viscous lidocaine for sore throat Reevaluation of the patient after these medicines showed that the patient improved I have reviewed the patients home medicines and have made adjustments as needed   Social Determinants of Health  Access to healthcare   Test / Admission - Considered  Discussed results with patient. She is hemodynamically stable and safe for discharge home. Prescriptions for viscous lidocaine and benzonatate sent to the pharmacy. Return precautions given.       Final Clinical Impression(s) / ED Diagnoses Final diagnoses:  COVID    Rx / DC Orders ED Discharge Orders          Ordered    lidocaine (XYLOCAINE) 2 % solution  As needed        07/18/23 1804    benzonatate (TESSALON) 100 MG capsule  3 times daily PRN        07/18/23 1804              Maxwell Marion, PA-C 07/18/23 1806    Arby Barrette, MD 07/30/23 1751

## 2023-08-31 ENCOUNTER — Ambulatory Visit: Payer: Self-pay | Admitting: Nurse Practitioner

## 2023-10-11 ENCOUNTER — Ambulatory Visit (INDEPENDENT_AMBULATORY_CARE_PROVIDER_SITE_OTHER): Payer: BC Managed Care – PPO | Admitting: Nurse Practitioner

## 2023-10-11 ENCOUNTER — Encounter: Payer: Self-pay | Admitting: Nurse Practitioner

## 2023-10-11 VITALS — BP 127/68 | HR 82 | Temp 97.4°F | Ht 64.0 in | Wt 186.6 lb

## 2023-10-11 DIAGNOSIS — E66811 Obesity, class 1: Secondary | ICD-10-CM | POA: Diagnosis not present

## 2023-10-11 DIAGNOSIS — I10 Essential (primary) hypertension: Secondary | ICD-10-CM | POA: Diagnosis not present

## 2023-10-11 DIAGNOSIS — E785 Hyperlipidemia, unspecified: Secondary | ICD-10-CM | POA: Diagnosis not present

## 2023-10-11 DIAGNOSIS — Z23 Encounter for immunization: Secondary | ICD-10-CM

## 2023-10-11 DIAGNOSIS — R7303 Prediabetes: Secondary | ICD-10-CM

## 2023-10-11 HISTORY — DX: Prediabetes: R73.03

## 2023-10-11 HISTORY — DX: Hyperlipidemia, unspecified: E78.5

## 2023-10-11 MED ORDER — LOSARTAN POTASSIUM-HCTZ 100-12.5 MG PO TABS
1.0000 | ORAL_TABLET | Freq: Every day | ORAL | 1 refills | Status: DC
Start: 2023-10-11 — End: 2024-05-29

## 2023-10-11 NOTE — Addendum Note (Signed)
Addended by: Renelda Loma on: 10/11/2023 11:47 AM   Modules accepted: Orders

## 2023-10-11 NOTE — Assessment & Plan Note (Signed)
Lab Results  Component Value Date   HGBA1C 5.7 (H) 03/01/2023  Avoid sugar sweets soda, lose weight We will recheck labs at next visit

## 2023-10-11 NOTE — Patient Instructions (Signed)
1. Obesity (BMI 30.0-34.9)   2. Need for shingles vaccine   3. Essential hypertension  - losartan-hydrochlorothiazide (HYZAAR) 100-12.5 MG tablet; Take 1 tablet by mouth daily.  Dispense: 90 tablet; Refill: 1    It is important that you exercise regularly at least 30 minutes 5 times a week as tolerated  Think about what you will eat, plan ahead. Choose " clean, green, fresh or frozen" over canned, processed or packaged foods which are more sugary, salty and fatty. 70 to 75% of food eaten should be vegetables and fruit. Three meals at set times with snacks allowed between meals, but they must be fruit or vegetables. Aim to eat over a 12 hour period , example 7 am to 7 pm, and STOP after  your last meal of the day. Drink water,generally about 64 ounces per day, no other drink is as healthy. Fruit juice is best enjoyed in a healthy way, by EATING the fruit.  Thanks for choosing [facility] we consider it a privelige to serve you.

## 2023-10-11 NOTE — Assessment & Plan Note (Addendum)
Wt Readings from Last 3 Encounters:  10/11/23 186 lb 9.6 oz (84.6 kg)  07/18/23 186 lb (84.4 kg)  03/01/23 200 lb 3.2 oz (90.8 kg)  Walking 1 mile 5 days a week.  She is also trying to eat healthy and also does portion control She has lost about 14 pounds since her last visit, patient congratulated on her efforts at losing weight Benefits of healthy weights discussed Encouraged to engage in regular moderate exercise at least 150 minutes weekly Patient counseled on low-carb modified diet

## 2023-10-11 NOTE — Assessment & Plan Note (Signed)
Patient educated on CDC recommendation for the vaccine. Verbal consent was obtained from the patient, vaccine administered by nurse, no sign of adverse reactions noted at this time. Patient education on arm soreness and use of tylenol  for this patient  was discussed. Patient educated on the signs and symptoms of adverse effect and advise to contact the office if they occur. Vaccine information sheet given to patient.  

## 2023-10-11 NOTE — Progress Notes (Signed)
Established Patient Office Visit  Subjective:  Patient ID: Yvette Franklin, female    DOB: 30-Dec-1970  Age: 52 y.o. MRN: [id number]  CC:  Chief Complaint  Patient presents with   Hypertension    HPI Yvette Franklin is a 52 y.o. female  has a past medical history of Breast cancer (HCC), Complication of anesthesia, Family history of GYN cancer (02/20/2022), Hyperlipidemia (10/11/2023), Hypertension, Obesity (BMI 30.0-34.9) (03/01/2023), PONV (postoperative nausea and vomiting), and Prediabetes (10/11/2023).  Patient presents for follow-up for her chronic medical conditions She denies any adverse reactions to her current medications States that she generally feels well Due for second dose of shingles vaccine vaccine given in the office today Please see assessment and plan section for full HPI         Past Medical History:  Diagnosis Date   Breast cancer (HCC)    left breast DCIS   Complication of anesthesia    Family history of GYN cancer 02/20/2022   Hyperlipidemia 10/11/2023   Hypertension    Obesity (BMI 30.0-34.9) 03/01/2023   PONV (postoperative nausea and vomiting)    Prediabetes 10/11/2023    Past Surgical History:  Procedure Laterality Date   BREAST BIOPSY Left 02/09/2022   BREAST BIOPSY Right 02/18/2022   BREAST RECONSTRUCTION WITH PLACEMENT OF TISSUE EXPANDER AND ALLODERM Left 04/22/2022   Procedure: BREAST RECONSTRUCTION WITH PLACEMENT OF TISSUE EXPANDER AND FLEX HD;  Surgeon: Peggye Form, DO;  Location: [facility];  Service: Plastics;  Laterality: Left;   CHOLECYSTECTOMY     IRRIGATION AND DEBRIDEMENT ABDOMEN Left 05/16/2022   Procedure: IRRIGATION AND DEBRIDEMENT OF LEFT BREAST POCKET;  Surgeon: Janne Napoleon, MD;  Location: [facility];  Service: Plastics;  Laterality: Left;   MASTECTOMY W/ SENTINEL NODE BIOPSY Left 04/22/2022   Procedure: LEFT MASTECTOMY WITH SENTINEL LYMPH NODE BIOPSY;  Surgeon: Griselda Miner, MD;  Location: [facility];  Service: General;  Laterality: Left;   REMOVAL OF TISSUE EXPANDER AND PLACEMENT OF IMPLANT Left 05/16/2022   Procedure: REMOVAL OF TISSUE EXPANDER;  Surgeon: Janne Napoleon, MD;  Location: [facility];  Service: Plastics;  Laterality: Left;    Family History  Problem Relation Age of Onset   Healthy Mother    Healthy Father    Cancer Maternal Aunt        GYN; ? ovarian   Cancer Cousin        unknown type; dx 45s   Breast cancer Neg Hx     Social History   Socioeconomic History   Marital status: Married    Spouse name: Not on file   Number of children: 3   Years of education: Not on file   Highest education level: Not on file  Occupational History   Not on file  Tobacco Use   Smoking status: Never   Smokeless tobacco: Never  Vaping Use   Vaping status: Never Used  Substance and Sexual Activity   Alcohol use: No   Drug use: No   Sexual activity: Yes    Birth control/protection: Post-menopausal    Comment: no periods >59yrs  Other Topics Concern   Not on file  Social History Narrative   Lives with her husband and her children    Social Determinants of Health   Financial Resource Strain: Not on file  Food Insecurity: Not on file  Transportation Needs: Not on file  Physical Activity: Not on file  Stress: Not on file  Social Connections: Unknown (  03/31/2022)   Received from [facility], [facility]   Social Network    Social Network: Not on file  Intimate Partner Violence: Unknown (02/20/2022)   Received from [facility], [facility]   HITS    Physically Hurt: Not on file    Insult or Talk Down To: Not on file    Threaten Physical Harm: Not on file    Scream or Curse: Not on file    Outpatient Medications Prior to Visit  Medication Sig Dispense Refill   losartan-hydrochlorothiazide (HYZAAR) 100-12.5 MG tablet Take 1 tablet by mouth daily. 90 tablet 1   Multiple Vitamin (CALCIUM COMPLEX PO) Take by mouth. (Patient not taking: Reported on  10/11/2023)     No facility-administered medications prior to visit.    Allergies  Allergen Reactions   Pork-Derived Products Other (See Comments)    Strictly no pork derived products- pt is Islam    ROS Review of Systems  Constitutional:  Negative for appetite change, chills, fatigue and fever.  HENT:  Negative for congestion, postnasal drip, rhinorrhea and sneezing.   Respiratory:  Negative for cough, shortness of breath and wheezing.   Cardiovascular:  Negative for chest pain, palpitations and leg swelling.  Gastrointestinal:  Negative for abdominal pain, constipation, nausea and vomiting.  Genitourinary:  Negative for difficulty urinating, dysuria, flank pain and frequency.  Musculoskeletal:  Negative for arthralgias, back pain, joint swelling and myalgias.  Skin:  Negative for color change, pallor, rash and wound.  Neurological:  Negative for dizziness, facial asymmetry, weakness, numbness and headaches.  Psychiatric/Behavioral:  Negative for behavioral problems, confusion, self-injury and suicidal ideas.       Objective:    Physical Exam Vitals and nursing note reviewed.  Constitutional:      General: She is not in acute distress.    Appearance: Normal appearance. She is obese. She is not ill-appearing, toxic-appearing or diaphoretic.  HENT:     Mouth/Throat:     Mouth: Mucous membranes are moist.     Pharynx: Oropharynx is clear. No oropharyngeal exudate or posterior oropharyngeal erythema.  Eyes:     General: No scleral icterus.       Right eye: No discharge.        Left eye: No discharge.     Extraocular Movements: Extraocular movements intact.     Conjunctiva/sclera: Conjunctivae normal.  Cardiovascular:     Rate and Rhythm: Normal rate and regular rhythm.     Pulses: Normal pulses.     Heart sounds: Normal heart sounds. No murmur heard.    No friction rub. No gallop.  Pulmonary:     Effort: Pulmonary effort is normal. No respiratory distress.     Breath  sounds: Normal breath sounds. No stridor. No wheezing, rhonchi or rales.  Chest:     Chest wall: No tenderness.  Abdominal:     General: There is no distension.     Palpations: Abdomen is soft.     Tenderness: There is no abdominal tenderness. There is no right CVA tenderness, left CVA tenderness or guarding.  Musculoskeletal:        General: No swelling, tenderness, deformity or signs of injury.     Right lower leg: No edema.     Left lower leg: No edema.  Skin:    General: Skin is warm and dry.     Capillary Refill: Capillary refill takes less than 2 seconds.     Coloration: Skin is not jaundiced or pale.  Findings: No bruising, erythema or lesion.  Neurological:     Mental Status: She is alert and oriented to person, place, and time.     Motor: No weakness.     Coordination: Coordination normal.     Gait: Gait normal.  Psychiatric:        Mood and Affect: Mood normal.        Behavior: Behavior normal.        Thought Content: Thought content normal.        Judgment: Judgment normal.     BP 127/68   Pulse 82   Temp (!) 97.4 F (36.3 C)   Ht 5\' 4"  (1.626 m)   Wt 186 lb 9.6 oz (84.6 kg)   LMP 11/15/2012 (Approximate)   SpO2 100%   BMI 32.03 kg/m  Wt Readings from Last 3 Encounters:  10/11/23 186 lb 9.6 oz (84.6 kg)  07/18/23 186 lb (84.4 kg)  03/01/23 200 lb 3.2 oz (90.8 kg)    Lab Results  Component Value Date   TSH 1.920 03/01/2023   Lab Results  Component Value Date   WBC 5.7 03/01/2023   HGB 13.8 03/01/2023   HCT 41.8 03/01/2023   MCV 89 03/01/2023   PLT 275 03/01/2023   Lab Results  Component Value Date   NA 142 03/01/2023   K 3.8 03/01/2023   CO2 25 03/01/2023   GLUCOSE 97 03/01/2023   BUN 13 03/01/2023   CREATININE 0.95 03/01/2023   BILITOT 0.2 03/01/2023   ALKPHOS 70 03/01/2023   AST 19 03/01/2023   ALT 14 03/01/2023   PROT 7.3 03/01/2023   ALBUMIN 4.3 03/01/2023   CALCIUM 9.7 03/01/2023   ANIONGAP 11 05/20/2022   EGFR 72 03/01/2023    Lab Results  Component Value Date   CHOL 218 (H) 03/01/2023   Lab Results  Component Value Date   HDL 53 03/01/2023   Lab Results  Component Value Date   LDLCALC 150 (H) 03/01/2023   Lab Results  Component Value Date   TRIG 87 03/01/2023   Lab Results  Component Value Date   CHOLHDL 4.1 03/01/2023   Lab Results  Component Value Date   HGBA1C 5.7 (H) 03/01/2023      Assessment & Plan:   Problem List Items Addressed This Visit       Cardiovascular and Mediastinum   Essential hypertension    BP Readings from Last 3 Encounters:  10/11/23 127/68  07/18/23 (!) 151/95  03/01/23 129/64   HTN Controlled .  On losartan-hydrochlorothiazide 100-12.5 mg 1 tablets daily Stated that she has been taking the medication on most days, and her husband has a blood pressure monitor but she does not check her blood pressure She denies chest pain shortness of breath edema Patient encouraged to take medication daily as ordered Encouraged to monitor blood pressure and report hypotension Continue current medications. No changes in management. Discussed DASH diet and dietary sodium restrictions Continue to increase dietary efforts and exercise.  CMP today, medication refilled Follow-up in 5 months for CPE        Relevant Medications   losartan-hydrochlorothiazide (HYZAAR) 100-12.5 MG tablet   Other Relevant Orders   CMP14+EGFR     Other   Obesity (BMI 30.0-34.9) - Primary    Wt Readings from Last 3 Encounters:  10/11/23 186 lb 9.6 oz (84.6 kg)  07/18/23 186 lb (84.4 kg)  03/01/23 200 lb 3.2 oz (90.8 kg)  Walking 1 mile 5 days a week.  She is also trying to eat healthy and also does portion control She has lost about 14 pounds since her last visit, patient congratulated on her efforts at losing weight Benefits of healthy weights discussed Encouraged to engage in regular moderate exercise at least 150 minutes weekly Patient counseled on low-carb modified diet       Need  for shingles vaccine    Patient educated on CDC recommendation for the vaccine. Verbal consent was obtained from the patient, vaccine administered by nurse, no sign of adverse reactions noted at this time. Patient education on arm soreness and use of tylenol for this patient  was discussed. Patient educated on the signs and symptoms of adverse effect and advise to contact the office if they occur. Vaccine information sheet given to patient.        Hyperlipidemia    Lab Results  Component Value Date   CHOL 218 (H) 03/01/2023   HDL 53 03/01/2023   LDLCALC 150 (H) 03/01/2023   TRIG 87 03/01/2023   CHOLHDL 4.1 03/01/2023  Avoid fatty fried foods, continue to intensify efforts at losing weight Recheck labs at next visit      Relevant Medications   losartan-hydrochlorothiazide (HYZAAR) 100-12.5 MG tablet   Prediabetes    Lab Results  Component Value Date   HGBA1C 5.7 (H) 03/01/2023  Avoid sugar sweets soda, lose weight We will recheck labs at next visit       Meds ordered this encounter  Medications   losartan-hydrochlorothiazide (HYZAAR) 100-12.5 MG tablet    Sig: Take 1 tablet by mouth daily.    Dispense:  90 tablet    Refill:  1    Follow-up: Return in about 5 months (around 03/10/2024) for CPE.    Donell Beers, FNP

## 2023-10-11 NOTE — Assessment & Plan Note (Addendum)
Lab Results  Component Value Date   CHOL 218 (H) 03/01/2023   HDL 53 03/01/2023   LDLCALC 150 (H) 03/01/2023   TRIG 87 03/01/2023   CHOLHDL 4.1 03/01/2023  Avoid fatty fried foods, continue to intensify efforts at losing weight Recheck labs at next visit

## 2023-10-11 NOTE — Assessment & Plan Note (Signed)
BP Readings from Last 3 Encounters:  10/11/23 127/68  07/18/23 (!) 151/95  03/01/23 129/64   HTN Controlled .  On losartan-hydrochlorothiazide 100-12.5 mg 1 tablets daily Stated that she has been taking the medication on most days, and her husband has a blood pressure monitor but she does not check her blood pressure She denies chest pain shortness of breath edema Patient encouraged to take medication daily as ordered Encouraged to monitor blood pressure and report hypotension Continue current medications. No changes in management. Discussed DASH diet and dietary sodium restrictions Continue to increase dietary efforts and exercise.  CMP today, medication refilled Follow-up in 5 months for CPE

## 2023-10-12 LAB — CMP14+EGFR
ALT: 13 [IU]/L (ref 0–32)
AST: 18 [IU]/L (ref 0–40)
Albumin: 4.4 g/dL (ref 3.8–4.9)
Alkaline Phosphatase: 70 [IU]/L (ref 44–121)
BUN/Creatinine Ratio: 16 (ref 9–23)
BUN: 12 mg/dL (ref 6–24)
Bilirubin Total: 0.3 mg/dL (ref 0.0–1.2)
CO2: 23 mmol/L (ref 20–29)
Calcium: 9.8 mg/dL (ref 8.7–10.2)
Chloride: 106 mmol/L (ref 96–106)
Creatinine, Ser: 0.74 mg/dL (ref 0.57–1.00)
Globulin, Total: 2.6 g/dL (ref 1.5–4.5)
Glucose: 94 mg/dL (ref 70–99)
Potassium: 4.1 mmol/L (ref 3.5–5.2)
Sodium: 144 mmol/L (ref 134–144)
Total Protein: 7 g/dL (ref 6.0–8.5)
eGFR: 97 mL/min/{1.73_m2} (ref >59)

## 2024-01-10 ENCOUNTER — Telehealth: Payer: Self-pay | Admitting: Hematology and Oncology

## 2024-01-10 NOTE — Telephone Encounter (Signed)
 Scheduled appointment incoming call. Patient is aware of the made appointment.

## 2024-01-21 ENCOUNTER — Ambulatory Visit: Admitting: Podiatry

## 2024-01-24 ENCOUNTER — Ambulatory Visit (INDEPENDENT_AMBULATORY_CARE_PROVIDER_SITE_OTHER)

## 2024-01-24 ENCOUNTER — Other Ambulatory Visit: Payer: Self-pay | Admitting: Podiatry

## 2024-01-24 ENCOUNTER — Ambulatory Visit (INDEPENDENT_AMBULATORY_CARE_PROVIDER_SITE_OTHER): Admitting: Podiatry

## 2024-01-24 ENCOUNTER — Encounter: Payer: Self-pay | Admitting: Podiatry

## 2024-01-24 DIAGNOSIS — M79674 Pain in right toe(s): Secondary | ICD-10-CM

## 2024-01-24 DIAGNOSIS — M21619 Bunion of unspecified foot: Secondary | ICD-10-CM

## 2024-01-24 DIAGNOSIS — M21611 Bunion of right foot: Secondary | ICD-10-CM | POA: Diagnosis not present

## 2024-01-24 DIAGNOSIS — M79672 Pain in left foot: Secondary | ICD-10-CM

## 2024-01-26 NOTE — Progress Notes (Signed)
 Subjective:   Patient ID: Yvette Franklin, female   DOB: 53 y.o.   MRN: [id number]   HPI Patient states that she is having a lot of pain in her left foot and that she was getting out of the shower and traumatized.  Also has concerns about significant bunion deformity right over left and patient does not smoke and likes to be active if possible   Review of Systems  All other systems reviewed and are negative.       Objective:  Physical Exam Vitals and nursing note reviewed.  Constitutional:      Appearance: She is well-developed.  Pulmonary:     Effort: Pulmonary effort is normal.  Musculoskeletal:        General: Normal range of motion.  Skin:    General: Skin is warm.  Neurological:     Mental Status: She is alert.     Neurovascular status intact muscle strength was found to be adequate range of motion is found to be adequate with patient noted to have large hyperostosis medial aspect first metatarsal head right over left with moderate flatfoot deformity and noted to have swollen fifth digit left foot with history of trauma several weeks ago.  Patient has good digital perfusion well-oriented significant flatfoot     Assessment:  Significant structural bunion secondary to foot structure along with digital trauma left fifth toe that may be fractured     Plan:  H&P x-rays reviewed discussed and discussion concerning bunion correction and either aggressive distal osteotomy which I think could be done versus Lapidus fusion and for the left revealed a subtle fracture of the fifth toe but not displaced and should heal uneventfully.  All questions answered today discussion of both conditions

## 2024-01-31 DIAGNOSIS — L811 Chloasma: Secondary | ICD-10-CM | POA: Diagnosis not present

## 2024-02-10 ENCOUNTER — Other Ambulatory Visit: Payer: Self-pay | Admitting: Adult Health

## 2024-02-10 DIAGNOSIS — Z1231 Encounter for screening mammogram for malignant neoplasm of breast: Secondary | ICD-10-CM

## 2024-02-14 ENCOUNTER — Inpatient Hospital Stay: Payer: BC Managed Care – PPO | Admitting: Hematology and Oncology

## 2024-02-14 NOTE — Assessment & Plan Note (Deleted)
 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  Left mastectomy 04/22/22: HG DCIS, margins Neg, 0/4 LN Neg, ER 20%, PR 0% 2. Followed by antiestrogen therapy with tamoxifen 5 mg 5 years (based on TAM-01 clinical trial) discontinued 3.  Switched to anastrozole discontinued 06/16/2022, switched to exemestane discontinued because of joint pains  Breast cancer surveillance: Breast exam 02/14/2024: Benign Mammogram scheduled for 03/20/2024 on the right breast  Return to clinic in 1 year for follow-up

## 2024-03-01 DIAGNOSIS — L578 Other skin changes due to chronic exposure to nonionizing radiation: Secondary | ICD-10-CM | POA: Diagnosis not present

## 2024-03-01 DIAGNOSIS — L309 Dermatitis, unspecified: Secondary | ICD-10-CM | POA: Diagnosis not present

## 2024-03-06 ENCOUNTER — Ambulatory Visit (INDEPENDENT_AMBULATORY_CARE_PROVIDER_SITE_OTHER): Payer: Self-pay | Admitting: Nurse Practitioner

## 2024-03-06 ENCOUNTER — Encounter: Payer: Self-pay | Admitting: Nurse Practitioner

## 2024-03-06 VITALS — BP 122/72 | HR 85 | Temp 97.0°F | Wt 190.0 lb

## 2024-03-06 DIAGNOSIS — R7303 Prediabetes: Secondary | ICD-10-CM | POA: Diagnosis not present

## 2024-03-06 DIAGNOSIS — Z13228 Encounter for screening for other metabolic disorders: Secondary | ICD-10-CM | POA: Diagnosis not present

## 2024-03-06 DIAGNOSIS — Z1321 Encounter for screening for nutritional disorder: Secondary | ICD-10-CM | POA: Diagnosis not present

## 2024-03-06 DIAGNOSIS — I10 Essential (primary) hypertension: Secondary | ICD-10-CM | POA: Diagnosis not present

## 2024-03-06 DIAGNOSIS — Z130 Encounter for screening for diseases of the blood and blood-forming organs and certain disorders involving the immune mechanism: Secondary | ICD-10-CM

## 2024-03-06 DIAGNOSIS — Z1329 Encounter for screening for other suspected endocrine disorder: Secondary | ICD-10-CM

## 2024-03-06 DIAGNOSIS — Z0000 Encounter for general adult medical examination without abnormal findings: Secondary | ICD-10-CM

## 2024-03-06 DIAGNOSIS — E785 Hyperlipidemia, unspecified: Secondary | ICD-10-CM | POA: Diagnosis not present

## 2024-03-06 DIAGNOSIS — E65 Localized adiposity: Secondary | ICD-10-CM | POA: Insufficient documentation

## 2024-03-06 NOTE — Patient Instructions (Signed)

## 2024-03-06 NOTE — Assessment & Plan Note (Signed)
 Lab Results  Component Value Date   CHOL 218 (H) 03/01/2023   HDL 53 03/01/2023   LDLCALC 150 (H) 03/01/2023   TRIG 87 03/01/2023   CHOLHDL 4.1 03/01/2023  Checking lipid panel Avoid fatty fried foods, lose weight

## 2024-03-06 NOTE — Assessment & Plan Note (Addendum)
 Wt Readings from Last 3 Encounters:  03/06/24 190 lb (86.2 kg)  10/11/23 186 lb 9.6 oz (84.6 kg)  07/18/23 186 lb (84.4 kg)   Body mass index is 32.61 kg/m.  Patient counseled on low-carb diet Encouraged to engage in regular moderate exercise at least 150 minutes weekly as tolerated. Wants to have a liposuction procedure done.  Awaiting need paper work from Personal assistant for surgical clearance

## 2024-03-06 NOTE — Assessment & Plan Note (Signed)
 Continue losartan -HCT 100-12.5mg  daily  DASH diet and commitment to daily physical activity for a minimum of 30 minutes discussed and encouraged, as a part of hypertension management. The importance of attaining a healthy weight is also discussed.     03/06/2024    8:29 AM 10/11/2023   10:35 AM 07/18/2023    4:07 PM 07/18/2023    4:03 PM 03/01/2023   10:57 AM 01/22/2023    9:24 AM 09/25/2022    1:16 PM  BP/Weight  Systolic BP 122 127 151  129 112 118  Diastolic BP 72 68 95  64 70 78  Wt. (Lbs) 190 186.6  186 200.2    BMI 32.61 kg/m2 32.03 kg/m2  31.93 kg/m2 34.36 kg/m2

## 2024-03-06 NOTE — Assessment & Plan Note (Signed)
 Annual exam as documented.  Counseling done include healthy lifestyle involving committing to 150 minutes of exercise per week, heart healthy diet, and attaining healthy weight. The importance of adequate sleep also discussed.  Regular use of seat belt and home safety were also discussed . Changes in health habits are decided on by patient with goals and time frames set for achieving them. Immunization and cancer screening  needs are specifically addressed at this visit.    Has upcoming mammogram  - Lipid panel - CBC - Hemoglobin A1c -CMP14+EGFR

## 2024-03-06 NOTE — Assessment & Plan Note (Signed)
 Lab Results  Component Value Date   HGBA1C 5.7 (H) 03/01/2023  Avoid sugar sweets soda Rechecking labs

## 2024-03-06 NOTE — Progress Notes (Signed)
 Established Patient Office Visit  Subjective:  Patient ID: Yvette Franklin, female    DOB: 28-Aug-1971  Age: 53 y.o. MRN: [id number]  CC:  Chief Complaint  Patient presents with   Hypertension    HPI Yvette Franklin is a 53 y.o. female  has a past medical history of Breast cancer (HCC), Complication of anesthesia, Family history of GYN cancer (02/20/2022), Hyperlipidemia (10/11/2023), Hypertension, Obesity (BMI 30.0-34.9) (03/01/2023), PONV (postoperative nausea and vomiting), and Prediabetes (10/11/2023).  Patient presents for a CPE  She is on a low-salt low-fat diet does walking exercises daily at least 1 hour, feels very well and sleeps very well  Abdominal obesity.  States that she does weight lifting to lose weight, also does walking exercises and follows a low-salt low-fat low-carb diet. she is worried about her abdominal obesity, she is not able to wear skirts and dresses due to her obesity, does have pain on her back when she lifts weights, she plans on getting liposuction procedure.  Stated that she has done laser treatment to help lose abdominal fat without good  results  Hypertension.  Currently on losartan -hydrochlorothiazide  100-12.5 mg 1 tablet daily.  Patient denies chest pain, shortness of breath, edema    Past Medical History:  Diagnosis Date   Breast cancer (HCC)    left breast DCIS   Complication of anesthesia    Family history of GYN cancer 02/20/2022   Hyperlipidemia 10/11/2023   Hypertension    Obesity (BMI 30.0-34.9) 03/01/2023   PONV (postoperative nausea and vomiting)    Prediabetes 10/11/2023    Past Surgical History:  Procedure Laterality Date   BREAST BIOPSY Left 02/09/2022   BREAST BIOPSY Right 02/18/2022   BREAST RECONSTRUCTION WITH PLACEMENT OF TISSUE EXPANDER AND ALLODERM Left 04/22/2022   Procedure: BREAST RECONSTRUCTION WITH PLACEMENT OF TISSUE EXPANDER AND FLEX HD;  Surgeon: Thornell Flirt, DO;  Location: [facility];   Service: Plastics;  Laterality: Left;   CHOLECYSTECTOMY     IRRIGATION AND DEBRIDEMENT ABDOMEN Left 05/16/2022   Procedure: IRRIGATION AND DEBRIDEMENT OF LEFT BREAST POCKET;  Surgeon: Rodman Clam, MD;  Location: [facility];  Service: Plastics;  Laterality: Left;   MASTECTOMY W/ SENTINEL NODE BIOPSY Left 04/22/2022   Procedure: LEFT MASTECTOMY WITH SENTINEL LYMPH NODE BIOPSY;  Surgeon: Caralyn Chandler, MD;  Location: [facility];  Service: General;  Laterality: Left;   REMOVAL OF TISSUE EXPANDER AND PLACEMENT OF IMPLANT Left 05/16/2022   Procedure: REMOVAL OF TISSUE EXPANDER;  Surgeon: Rodman Clam, MD;  Location: [facility] OR;  Service: Plastics;  Laterality: Left;    Family History  Problem Relation Age of Onset   Healthy Mother    Healthy Father    Cancer Maternal Aunt        GYN; ? ovarian   Cancer Cousin        unknown type; dx 41s   Breast cancer Neg Hx     Social History   Socioeconomic History   Marital status: Married    Spouse name: Not on file   Number of children: 3   Years of education: Not on file   Highest education level: Not on file  Occupational History   Not on file  Tobacco Use   Smoking status: Never   Smokeless tobacco: Never  Vaping Use   Vaping status: Never Used  Substance and Sexual Activity   Alcohol use: No   Drug use: No   Sexual activity: Yes  Birth control/protection: Post-menopausal    Comment: no periods >67yrs  Other Topics Concern   Not on file  Social History Narrative   Lives with her husband and her children    Social Drivers of Corporate investment banker Strain: Not on file  Food Insecurity: Not on file  Transportation Needs: Not on file  Physical Activity: Not on file  Stress: Not on file  Social Connections: Unknown (03/31/2022)   Received from [facility], [facility]   Social Network    Social Network: Not on file  Intimate Partner Violence: Unknown (02/20/2022)   Received from [facility], [facility]    HITS    Physically Hurt: Not on file    Insult or Talk Down To: Not on file    Threaten Physical Harm: Not on file    Scream or Curse: Not on file    Outpatient Medications Prior to Visit  Medication Sig Dispense Refill   hydrocortisone 2.5 % cream Apply 1 Application topically.     losartan -hydrochlorothiazide  (HYZAAR ) 100-12.5 MG tablet Take 1 tablet by mouth daily. 90 tablet 1   Multiple Vitamin (CALCIUM  COMPLEX PO) Take by mouth. (Patient not taking: Reported on 03/06/2024)     triamcinolone  cream (KENALOG) 0.1 % Apply 1 Application topically. (Patient not taking: Reported on 03/06/2024)     No facility-administered medications prior to visit.    Allergies  Allergen Reactions   Pork-Derived Products Other (See Comments)    Strictly no pork derived products- pt is Islam    ROS Review of Systems  Constitutional:  Negative for appetite change, chills, fatigue and fever.  HENT:  Negative for congestion, postnasal drip, rhinorrhea and sneezing.   Eyes:  Negative for pain, discharge and itching.  Respiratory:  Negative for cough, shortness of breath and wheezing.   Cardiovascular:  Negative for chest pain, palpitations and leg swelling.  Gastrointestinal:  Negative for abdominal pain, constipation, nausea and vomiting.  Endocrine: Negative for cold intolerance, heat intolerance and polydipsia.  Genitourinary:  Negative for difficulty urinating, dysuria, flank pain and frequency.  Musculoskeletal:  Negative for arthralgias, back pain, joint swelling and myalgias.  Skin:  Negative for color change, pallor, rash and wound.  Neurological:  Negative for dizziness, facial asymmetry, weakness, numbness and headaches.  Psychiatric/Behavioral:  Negative for behavioral problems, confusion, self-injury and suicidal ideas.       Objective:    Physical Exam Vitals and nursing note reviewed. Exam conducted with a chaperone present.  Constitutional:      General: She is not in acute  distress.    Appearance: Normal appearance. She is obese. She is not ill-appearing, toxic-appearing or diaphoretic.  HENT:     Right Ear: Tympanic membrane, ear canal and external ear normal. There is no impacted cerumen.     Left Ear: Tympanic membrane, ear canal and external ear normal. There is no impacted cerumen.     Nose: Nose normal. No congestion or rhinorrhea.     Mouth/Throat:     Mouth: Mucous membranes are moist.     Pharynx: Oropharynx is clear. No oropharyngeal exudate or posterior oropharyngeal erythema.  Eyes:     General: No scleral icterus.       Right eye: No discharge.        Left eye: No discharge.     Extraocular Movements: Extraocular movements intact.     Conjunctiva/sclera: Conjunctivae normal.  Neck:     Vascular: No carotid bruit.  Cardiovascular:  Rate and Rhythm: Normal rate and regular rhythm.     Pulses: Normal pulses.     Heart sounds: Normal heart sounds. No murmur heard.    No friction rub. No gallop.  Pulmonary:     Effort: Pulmonary effort is normal. No respiratory distress.     Breath sounds: Normal breath sounds. No stridor. No wheezing, rhonchi or rales.  Chest:     Chest wall: No mass, lacerations, tenderness or edema.  Breasts:    Right: No swelling, bleeding, inverted nipple, mass, nipple discharge, skin change or tenderness.     Left: Absent.     Comments: Left mastectomy Abdominal:     General: Bowel sounds are normal. There is no distension.     Palpations: Abdomen is soft. There is no mass.     Tenderness: There is no abdominal tenderness. There is no right CVA tenderness, left CVA tenderness, guarding or rebound.     Hernia: No hernia is present.  Musculoskeletal:        General: No swelling, tenderness, deformity or signs of injury.     Cervical back: Normal range of motion and neck supple. No rigidity or tenderness.     Right lower leg: No edema.     Left lower leg: No edema.  Lymphadenopathy:     Cervical: No cervical  adenopathy.     Upper Body:     Right upper body: No supraclavicular, axillary or pectoral adenopathy.  Skin:    General: Skin is warm and dry.     Capillary Refill: Capillary refill takes less than 2 seconds.     Coloration: Skin is not jaundiced or pale.     Findings: No bruising, erythema, lesion or rash.  Neurological:     Mental Status: She is alert and oriented to person, place, and time.     Cranial Nerves: No cranial nerve deficit.     Sensory: No sensory deficit.     Motor: No weakness.     Coordination: Coordination normal.     Gait: Gait normal.     Deep Tendon Reflexes: Reflexes normal.  Psychiatric:        Mood and Affect: Mood normal.        Behavior: Behavior normal.        Thought Content: Thought content normal.        Judgment: Judgment normal.     BP 122/72   Pulse 85   Temp (!) 97 F (36.1 C)   Wt 190 lb (86.2 kg)   LMP 11/15/2012 (Approximate)   SpO2 100%   BMI 32.61 kg/m  Wt Readings from Last 3 Encounters:  03/06/24 190 lb (86.2 kg)  10/11/23 186 lb 9.6 oz (84.6 kg)  07/18/23 186 lb (84.4 kg)    Lab Results  Component Value Date   TSH 1.920 03/01/2023   Lab Results  Component Value Date   WBC 5.7 03/01/2023   HGB 13.8 03/01/2023   HCT 41.8 03/01/2023   MCV 89 03/01/2023   PLT 275 03/01/2023   Lab Results  Component Value Date   NA 144 10/11/2023   K 4.1 10/11/2023   CO2 23 10/11/2023   GLUCOSE 94 10/11/2023   BUN 12 10/11/2023   CREATININE 0.74 10/11/2023   BILITOT 0.3 10/11/2023   ALKPHOS 70 10/11/2023   AST 18 10/11/2023   ALT 13 10/11/2023   PROT 7.0 10/11/2023   ALBUMIN 4.4 10/11/2023   CALCIUM  9.8 10/11/2023   ANIONGAP 11 05/20/2022  EGFR 97 10/11/2023   Lab Results  Component Value Date   CHOL 218 (H) 03/01/2023   Lab Results  Component Value Date   HDL 53 03/01/2023   Lab Results  Component Value Date   LDLCALC 150 (H) 03/01/2023   Lab Results  Component Value Date   TRIG 87 03/01/2023   Lab Results   Component Value Date   CHOLHDL 4.1 03/01/2023   Lab Results  Component Value Date   HGBA1C 5.7 (H) 03/01/2023      Assessment & Plan:   Problem List Items Addressed This Visit       Cardiovascular and Mediastinum   Essential hypertension   Continue losartan -HCT 100-12.5mg  daily  DASH diet and commitment to daily physical activity for a minimum of 30 minutes discussed and encouraged, as a part of hypertension management. The importance of attaining a healthy weight is also discussed.     03/06/2024    8:29 AM 10/11/2023   10:35 AM 07/18/2023    4:07 PM 07/18/2023    4:03 PM 03/01/2023   10:57 AM 01/22/2023    9:24 AM 09/25/2022    1:16 PM  BP/Weight  Systolic BP 122 127 151  129 112 118  Diastolic BP 72 68 95  64 70 78  Wt. (Lbs) 190 186.6  186 200.2    BMI 32.61 kg/m2 32.03 kg/m2  31.93 kg/m2 34.36 kg/m2             Relevant Orders   CMP14+EGFR     Other   Annual physical exam - Primary   Annual exam as documented.  Counseling done include healthy lifestyle involving committing to 150 minutes of exercise per week, heart healthy diet, and attaining healthy weight. The importance of adequate sleep also discussed.  Regular use of seat belt and home safety were also discussed . Changes in health habits are decided on by patient with goals and time frames set for achieving them. Immunization and cancer screening  needs are specifically addressed at this visit.    Has upcoming mammogram  - Lipid panel - CBC - Hemoglobin A1c -CMP14+EGFR        Hyperlipidemia   Lab Results  Component Value Date   CHOL 218 (H) 03/01/2023   HDL 53 03/01/2023   LDLCALC 150 (H) 03/01/2023   TRIG 87 03/01/2023   CHOLHDL 4.1 03/01/2023  Checking lipid panel Avoid fatty fried foods, lose weight      Relevant Orders   Lipid panel   Prediabetes   Lab Results  Component Value Date   HGBA1C 5.7 (H) 03/01/2023  Avoid sugar sweets soda Rechecking labs      Relevant Orders    Hemoglobin A1c   Abdominal obesity   Wt Readings from Last 3 Encounters:  03/06/24 190 lb (86.2 kg)  10/11/23 186 lb 9.6 oz (84.6 kg)  07/18/23 186 lb (84.4 kg)   Body mass index is 32.61 kg/m.  Patient counseled on low-carb diet Encouraged to engage in regular moderate exercise at least 150 minutes weekly as tolerated. Wants to have a liposuction procedure done.  Awaiting need paper work from Personal assistant for surgical clearance       Other Visit Diagnoses       Screening for endocrine, nutritional, metabolic and immunity disorder       Relevant Orders   Lipid panel   CBC       No orders of the defined types were placed in this encounter.   Follow-up:  Return in about 6 months (around 09/05/2024).    Circe Chilton R Johncharles Fusselman, FNP

## 2024-03-07 LAB — CMP14+EGFR
ALT: 13 IU/L (ref 0–32)
AST: 18 IU/L (ref 0–40)
Albumin: 4.1 g/dL (ref 3.8–4.9)
Alkaline Phosphatase: 68 IU/L (ref 44–121)
BUN/Creatinine Ratio: 17 (ref 9–23)
BUN: 13 mg/dL (ref 6–24)
Bilirubin Total: 0.3 mg/dL (ref 0.0–1.2)
CO2: 25 mmol/L (ref 20–29)
Calcium: 9.8 mg/dL (ref 8.7–10.2)
Chloride: 106 mmol/L (ref 96–106)
Creatinine, Ser: 0.78 mg/dL (ref 0.57–1.00)
Globulin, Total: 2.6 g/dL (ref 1.5–4.5)
Glucose: 88 mg/dL (ref 70–99)
Potassium: 4 mmol/L (ref 3.5–5.2)
Sodium: 144 mmol/L (ref 134–144)
Total Protein: 6.7 g/dL (ref 6.0–8.5)
eGFR: 91 mL/min/{1.73_m2} (ref >59)

## 2024-03-07 LAB — CBC
Hematocrit: 42.2 % (ref 34.0–46.6)
Hemoglobin: 13.7 g/dL (ref 11.1–15.9)
MCH: 29.5 pg (ref 26.6–33.0)
MCHC: 32.5 g/dL (ref 31.5–35.7)
MCV: 91 fL (ref 79–97)
Platelets: 279 10*3/uL (ref 150–450)
RBC: 4.64 x10E6/uL (ref 3.77–5.28)
RDW: 12.6 % (ref 11.7–15.4)
WBC: 5.1 10*3/uL (ref 3.4–10.8)

## 2024-03-07 LAB — LIPID PANEL
Chol/HDL Ratio: 3.7 ratio (ref 0.0–4.4)
Cholesterol, Total: 197 mg/dL (ref 100–199)
HDL: 53 mg/dL (ref >39)
LDL Chol Calc (NIH): 127 mg/dL — ABNORMAL HIGH (ref 0–99)
Triglycerides: 97 mg/dL (ref 0–149)
VLDL Cholesterol Cal: 17 mg/dL (ref 5–40)

## 2024-03-07 LAB — HEMOGLOBIN A1C
Est. average glucose Bld gHb Est-mCnc: 120 mg/dL
Hgb A1c MFr Bld: 5.8 % — ABNORMAL HIGH (ref 4.8–5.6)

## 2024-03-13 ENCOUNTER — Ambulatory Visit: Payer: Self-pay | Admitting: Nurse Practitioner

## 2024-03-20 ENCOUNTER — Ambulatory Visit
Admission: RE | Admit: 2024-03-20 | Discharge: 2024-03-20 | Disposition: A | Discharge status: Home / Self Care | Source: Ambulatory Visit | Attending: Adult Health | Admitting: Adult Health

## 2024-03-20 DIAGNOSIS — Z1231 Encounter for screening mammogram for malignant neoplasm of breast: Secondary | ICD-10-CM

## 2024-04-04 ENCOUNTER — Ambulatory Visit: Payer: Self-pay | Admitting: Nurse Practitioner

## 2024-04-17 ENCOUNTER — Inpatient Hospital Stay: Attending: Hematology and Oncology | Admitting: Hematology and Oncology

## 2024-04-17 VITALS — BP 117/67 | HR 68 | Temp 97.0°F | Resp 18 | Ht 64.0 in | Wt 188.4 lb

## 2024-04-17 DIAGNOSIS — D0512 Intraductal carcinoma in situ of left breast: Secondary | ICD-10-CM | POA: Insufficient documentation

## 2024-04-17 DIAGNOSIS — Z9012 Acquired absence of left breast and nipple: Secondary | ICD-10-CM | POA: Insufficient documentation

## 2024-04-17 DIAGNOSIS — Z1722 Progesterone receptor negative status: Secondary | ICD-10-CM | POA: Diagnosis not present

## 2024-04-17 DIAGNOSIS — Z79899 Other long term (current) drug therapy: Secondary | ICD-10-CM | POA: Diagnosis not present

## 2024-04-17 DIAGNOSIS — Z170 Estrogen receptor positive status [ER+]: Secondary | ICD-10-CM | POA: Diagnosis not present

## 2024-04-17 NOTE — Assessment & Plan Note (Signed)
 cm stereotactic biopsy revealed high-grade DCIS ER 20% weak, PR 0% Right breast asymmetry: Ultrasound: Micro 6 6 mm: Biopsy done today   Recommendation: 1.  Left mastectomy 04/22/22: HG DCIS, margins Neg, 0/4 LN Neg, ER 20%, PR 0% 2. Followed by antiestrogen therapy with tamoxifen  5 mg 5 years (based on TAM-01 clinical trial) discontinued 3.  Switched to anastrozole  discontinued 06/16/2022 because of joint pains, switched to exemestane  07/31/2022 (discontinued 09/09/2022)   Because she has tried all options for antiestrogen therapy we will not continue any further antiestrogen treatments.    Breast cancer surveillance: Breast exam 04/17/2024: Benign Mammogram right breast 03/22/2024: Benign breast density category B  Return to clinic in 1 year for follow-up

## 2024-04-17 NOTE — Progress Notes (Signed)
 Patient Care Team: Paseda, Folashade R, FNP as PCP - General (Nurse Practitioner) Caralyn Chandler, MD as Consulting Physician (General Surgery) Cameron Cea, MD as Consulting Physician (Hematology and Oncology) Johna Myers, MD as Consulting Physician (Radiation Oncology)  DIAGNOSIS:  Encounter Diagnosis  Name Primary?   Ductal carcinoma in situ (DCIS) of left breast Yes    SUMMARY OF ONCOLOGIC HISTORY: Oncology History  Ductal carcinoma in situ (DCIS) of left breast  02/09/2022 Initial Diagnosis   Screening mammogram detected left breast asymmetry and calcifications 1.5 cm stereotactic biopsy revealed high-grade DCIS ER 20% weak, PR 0% Right breast asymmetry: Ultrasound: Micro 6 6 mm: Biopsy pending   02/18/2022 Cancer Staging   Staging form: Breast, AJCC 8th Edition - Clinical: Stage 0 (cTis (DCIS), cN0, cM0, G3, ER+, PR-, HER2: Not Assessed) - Signed by Cameron Cea, MD on 02/18/2022 Stage prefix: Initial diagnosis Histologic grading system: 3 grade system   02/26/2022 Genetic Testing   gative hereditary cancer genetic testing: no pathogenic variants detected in [facility] CustomNext-Cancer +RNAinsight Panel.  Report date is February 26, 2022.   The CustomNext-Cancer+RNAinsight panel offered by [facility] includes sequencing and rearrangement analysis for the following 47 genes:  APC, ATM, AXIN2, BARD1, BMPR1A, BRCA1, BRCA2, BRIP1, CDH1, CDK4, CDKN2A, CHEK2, DICER1, EPCAM, GREM1, HOXB13, MEN1, MLH1, MSH2, MSH3, MSH6, MUTYH, NBN, NF1, NF2, NTHL1, PALB2, PMS2, POLD1, POLE, PTEN, RAD51C, RAD51D, RECQL, RET, SDHA, SDHAF2, SDHB, SDHC, SDHD, SMAD4, SMARCA4, STK11, TP53, TSC1, TSC2, and VHL.  RNA data is routinely analyzed for use in variant interpretation for all genes.    04/22/2022 Surgery   Left Mastectomy: HG DCIS, margins Neg, 0/4 LN Neg, ER 20%, PR 0%   06/2022 -  Anti-estrogen oral therapy   Exemestane      CHIEF COMPLIANT: Surveillance  HISTORY OF PRESENT ILLNESS:   History of  Present Illness Yvette Franklin is a 53 year old female who presents with skin allergies. She was referred by urgent care to a dermatologist for further evaluation.  She has a history of ductal carcinoma in situ (DCIS) of the breast, diagnosed in March 2023. She is currently two years post-diagnosis and continues to undergo regular mammograms. She has been off hormone therapies since last year and occasionally experiences hot flashes.  She maintains an active lifestyle, engaging in walking and other exercises regularly. No joint pain or symptoms of arthritis.     ALLERGIES:  is allergic to pork-derived products.  MEDICATIONS:  Current Outpatient Medications  Medication Sig Dispense Refill   hydrocortisone 2.5 % cream Apply 1 Application topically.     losartan -hydrochlorothiazide  (HYZAAR ) 100-12.5 MG tablet Take 1 tablet by mouth daily. 90 tablet 1   hydroquinone 4 % cream Apply topically. (Patient not taking: Reported on 04/17/2024)     Multiple Vitamin (CALCIUM  COMPLEX PO) Take by mouth. (Patient not taking: Reported on 10/11/2023)     triamcinolone  cream (KENALOG) 0.1 % Apply 1 Application topically. (Patient not taking: Reported on 04/17/2024)     No current facility-administered medications for this visit.    PHYSICAL EXAMINATION: ECOG PERFORMANCE STATUS: 1 - Symptomatic but completely ambulatory  Vitals:   04/17/24 1131  BP: 117/67  Pulse: 68  Resp: 18  Temp: (!) 97 F (36.1 C)  SpO2: 100%   Filed Weights   04/17/24 1131  Weight: 188 lb 6.4 oz (85.5 kg)    Physical Exam Left mastectomy without any palpable lumps or nodules.  BREAST: Breasts normal on palpation.  (exam performed in the presence  of a chaperone)  LABORATORY DATA:  I have reviewed the data as listed    Latest Ref Rng & Units 03/06/2024    9:18 AM 10/11/2023   11:06 AM 03/01/2023   11:43 AM  CMP  Glucose 70 - 99 mg/dL 88  94  97   BUN 6 - 24 mg/dL 13  12  13    Creatinine 0.57 - 1.00 mg/dL 1.61  0.96   0.45   Sodium 134 - 144 mmol/L 144  144  142   Potassium 3.5 - 5.2 mmol/L 4.0  4.1  3.8   Chloride 96 - 106 mmol/L 106  106  106   CO2 20 - 29 mmol/L 25  23  25    Calcium  8.7 - 10.2 mg/dL 9.8  9.8  9.7   Total Protein 6.0 - 8.5 g/dL 6.7  7.0  7.3   Total Bilirubin 0.0 - 1.2 mg/dL 0.3  0.3  0.2   Alkaline Phos 44 - 121 IU/L 68  70  70   AST 0 - 40 IU/L 18  18  19    ALT 0 - 32 IU/L 13  13  14      Lab Results  Component Value Date   WBC 5.1 03/06/2024   HGB 13.7 03/06/2024   HCT 42.2 03/06/2024   MCV 91 03/06/2024   PLT 279 03/06/2024   NEUTROABS 3.0 03/01/2023    ASSESSMENT & PLAN:  Ductal carcinoma in situ (DCIS) of left breast cm stereotactic biopsy revealed high-grade DCIS ER 20% weak, PR 0% Right breast asymmetry: Ultrasound: Micro 6 6 mm: Biopsy done today   Recommendation: 1.  Left mastectomy 04/22/22: HG DCIS, margins Neg, 0/4 LN Neg, ER 20%, PR 0% 2. Followed by antiestrogen therapy with tamoxifen  5 mg 5 years (based on TAM-01 clinical trial) discontinued 3.  Switched to anastrozole  discontinued 06/16/2022 because of joint pains, switched to exemestane  07/31/2022 (discontinued 09/09/2022)   Because she has tried all options for antiestrogen therapy we will not continue any further antiestrogen treatments.    Breast cancer surveillance: Breast exam 04/17/2024: Benign Mammogram right breast 03/22/2024: Benign breast density category B   Return to clinic in 1 year for follow-up with Loris Ros for long-term survivorship     No orders of the defined types were placed in this encounter.  The patient has a good understanding of the overall plan. she agrees with it. she will call with any problems that may develop before the next visit here. Total time spent: 30 mins including face to face time and time spent for planning, charting and co-ordination of care   Margert Sheerer, MD 04/17/24

## 2024-05-13 ENCOUNTER — Emergency Department (HOSPITAL_BASED_OUTPATIENT_CLINIC_OR_DEPARTMENT_OTHER)
Admission: EM | Admit: 2024-05-13 | Discharge: 2024-05-13 | Disposition: A | Discharge status: Home / Self Care | Attending: Emergency Medicine | Admitting: Emergency Medicine

## 2024-05-13 ENCOUNTER — Encounter (HOSPITAL_BASED_OUTPATIENT_CLINIC_OR_DEPARTMENT_OTHER): Payer: Self-pay

## 2024-05-13 ENCOUNTER — Other Ambulatory Visit: Payer: Self-pay

## 2024-05-13 DIAGNOSIS — G501 Atypical facial pain: Secondary | ICD-10-CM | POA: Diagnosis not present

## 2024-05-13 DIAGNOSIS — Z853 Personal history of malignant neoplasm of breast: Secondary | ICD-10-CM | POA: Diagnosis not present

## 2024-05-13 DIAGNOSIS — L811 Chloasma: Secondary | ICD-10-CM | POA: Diagnosis not present

## 2024-05-13 DIAGNOSIS — I10 Essential (primary) hypertension: Secondary | ICD-10-CM | POA: Insufficient documentation

## 2024-05-13 NOTE — ED Notes (Signed)
 Dc instructions reviewed with patient. Patient voiced understanding. Dc with belongings.

## 2024-05-13 NOTE — ED Triage Notes (Signed)
 Pt complaining of dark spots around her eyes and on the cheeks. Started in march was put on a cream and it got better. Now it is back causing her face to itch and her eyes to burn.

## 2024-05-13 NOTE — ED Provider Notes (Signed)
 [facility] AT [facility] Provider Note   CSN: [id number] Arrival date & time: 05/13/24  1319     Patient presents with: Facial Pain   ABBEE Franklin is a 53 y.o. female with history of prediabetes, breast cancer, hypertension, presents with concern for dark spots around her eyes and nose that started in March of this year.  States she was given a cream to put on this area which did seem to improve symptoms, but then they returned.  States that the rash occasionally feels itchy.  The rash is not painful.  It has not been spreading since it first came up in March.   HPI     Prior to Admission medications   Medication Sig Start Date End Date Taking? Authorizing Provider  hydrocortisone 2.5 % cream Apply 1 Application topically. 03/01/24   [provider]  losartan -hydrochlorothiazide  (HYZAAR ) 100-12.5 MG tablet Take 1 tablet by mouth daily. 10/11/23   Paseda, Folashade R, FNP    Allergies: Pork-derived products    Review of Systems  Skin:  Positive for color change.    Updated Vital Signs BP 127/88   Pulse 75   Temp 98.2 F (36.8 C) (Oral)   Resp 16   Ht 5' 4 (1.626 m)   Wt 84.4 kg   LMP 11/15/2012 (Approximate)   SpO2 100%   BMI 31.93 kg/m   Physical Exam Vitals and nursing note reviewed.  Constitutional:      Appearance: Normal appearance.  HENT:     Head: Atraumatic.     Right Ear: Tympanic membrane normal.     Left Ear: Tympanic membrane normal.     Mouth/Throat:     Comments: Oropharynx without any rash or lesions Pulmonary:     Effort: Pulmonary effort is normal.   Skin:    Comments: Hyperpigmented macules (brown appearing) around the eyes bilaterally and onto the bridge of the nose.  No rash elsewhere of the upper or lower extremities, palms or soles, mouth, or torso   Neurological:     General: No focal deficit present.     Mental Status: She is alert.   Psychiatric:        Mood and Affect: Mood normal.         Behavior: Behavior normal.     (all labs ordered are listed, but only abnormal results are displayed) Labs Reviewed - No data to display  EKG: None  Radiology: No results found.   Procedures   Medications Ordered in the ED - No data to display                                  Medical Decision Making    Differential diagnosis includes but is not limited to melasma, contact dermatitis, psoriasis, eczema, SLE  ED Course:  Upon initial evaluation, patient is well-appearing, no acute distress.  Stable vitals.  She has hyperpigmentation surrounding the eyes bilaterally and on the bridge of the nose.  No erythema, no concern for cellulitis or contact dermatitis.  No erythema and does not appear malar, no concern for SLE. No overlying scale, no concern for psoriasis or yeast infection.  Skin changes consistent with melasma.  Since this is located around the eyes, do not feel comfortable prescribing any creams such as hydroquinone. Will have her follow up with dermatology.  Stable and appropriate for discharge home   Impression: Melasma  Disposition:  The patient was discharged home with instructions to continue with facial hygiene with facial cleanser and facial sensation daily.  Avoid sun exposure.  Follow-up with dermatology for further management, contact information provided Return precautions given.    This chart was dictated using voice recognition software, Dragon. Despite the best efforts of this provider to proofread and correct errors, errors may still occur which can change documentation meaning.       Final diagnoses:  Melasma    ED Discharge Orders     None          Yvette Franklin, [place] 05/13/24 1831    Tegeler, Yvette PARAS, MD 05/14/24 [phone]

## 2024-05-13 NOTE — Discharge Instructions (Signed)
 The dark spots on your face are due to hyperpigmentation from a condition called melasma.  Unfortunately, since the spots are around your eyes, we cannot use any of the typical treatment creams in this sensitive area.  Please continue to apply facial sunscreen daily to help reduce any further darkening of the spots.  Please follow-up with the dermatology office listed below, or an office of your choice, for further management. Sometimes there are laser treatments or other options for skin lightening.   Return to the ER for any other emergent concerns.

## 2024-05-27 DIAGNOSIS — H1033 Unspecified acute conjunctivitis, bilateral: Secondary | ICD-10-CM | POA: Diagnosis not present

## 2024-05-28 ENCOUNTER — Other Ambulatory Visit: Payer: Self-pay | Admitting: Nurse Practitioner

## 2024-05-28 DIAGNOSIS — I10 Essential (primary) hypertension: Secondary | ICD-10-CM

## 2024-06-13 DIAGNOSIS — L249 Irritant contact dermatitis, unspecified cause: Secondary | ICD-10-CM | POA: Diagnosis not present

## 2024-06-13 DIAGNOSIS — L811 Chloasma: Secondary | ICD-10-CM | POA: Diagnosis not present

## 2024-07-19 DIAGNOSIS — Z961 Presence of intraocular lens: Secondary | ICD-10-CM | POA: Diagnosis not present

## 2024-07-19 DIAGNOSIS — H16143 Punctate keratitis, bilateral: Secondary | ICD-10-CM | POA: Diagnosis not present

## 2024-07-19 DIAGNOSIS — H16223 Keratoconjunctivitis sicca, not specified as Sjogren's, bilateral: Secondary | ICD-10-CM | POA: Diagnosis not present

## 2024-07-19 DIAGNOSIS — H2640 Unspecified secondary cataract: Secondary | ICD-10-CM | POA: Diagnosis not present

## 2024-07-24 DIAGNOSIS — L811 Chloasma: Secondary | ICD-10-CM | POA: Diagnosis not present

## 2024-07-26 DIAGNOSIS — Z961 Presence of intraocular lens: Secondary | ICD-10-CM | POA: Diagnosis not present

## 2024-07-26 DIAGNOSIS — H16143 Punctate keratitis, bilateral: Secondary | ICD-10-CM | POA: Diagnosis not present

## 2024-07-26 DIAGNOSIS — H16223 Keratoconjunctivitis sicca, not specified as Sjogren's, bilateral: Secondary | ICD-10-CM | POA: Diagnosis not present

## 2024-07-26 DIAGNOSIS — H2640 Unspecified secondary cataract: Secondary | ICD-10-CM | POA: Diagnosis not present

## 2024-08-02 DIAGNOSIS — H16143 Punctate keratitis, bilateral: Secondary | ICD-10-CM | POA: Diagnosis not present

## 2024-08-02 DIAGNOSIS — H2640 Unspecified secondary cataract: Secondary | ICD-10-CM | POA: Diagnosis not present

## 2024-08-02 DIAGNOSIS — H16223 Keratoconjunctivitis sicca, not specified as Sjogren's, bilateral: Secondary | ICD-10-CM | POA: Diagnosis not present

## 2024-08-02 DIAGNOSIS — Z961 Presence of intraocular lens: Secondary | ICD-10-CM | POA: Diagnosis not present

## 2024-09-11 ENCOUNTER — Encounter: Payer: Self-pay | Admitting: Nurse Practitioner

## 2024-09-11 ENCOUNTER — Ambulatory Visit (INDEPENDENT_AMBULATORY_CARE_PROVIDER_SITE_OTHER): Payer: Self-pay | Admitting: Nurse Practitioner

## 2024-09-11 VITALS — BP 121/73 | HR 71 | Wt 189.0 lb

## 2024-09-11 DIAGNOSIS — I10 Essential (primary) hypertension: Secondary | ICD-10-CM

## 2024-09-11 DIAGNOSIS — Z0000 Encounter for general adult medical examination without abnormal findings: Secondary | ICD-10-CM | POA: Insufficient documentation

## 2024-09-11 DIAGNOSIS — E66811 Obesity, class 1: Secondary | ICD-10-CM

## 2024-09-11 DIAGNOSIS — R7303 Prediabetes: Secondary | ICD-10-CM

## 2024-09-11 NOTE — Patient Instructions (Signed)
 1. Essential hypertension (Primary)  - CBC - Basic Metabolic Panel  2. Prediabetes   3. Obesity (BMI 30.0-34.9)  - VITAMIN D  25 Hydroxy (Vit-D Deficiency, Fractures)    It is important that you exercise regularly at least 30 minutes 5 times a week as tolerated  Think about what you will eat, plan ahead. Choose  clean, green, fresh or frozen over canned, processed or packaged foods which are more sugary, salty and fatty. 70 to 75% of food eaten should be vegetables and fruit. Three meals at set times with snacks allowed between meals, but they must be fruit or vegetables. Aim to eat over a 12 hour period , example 7 am to 7 pm, and STOP after  your last meal of the day. Drink water,generally about 64 ounces per day, no other drink is as healthy. Fruit juice is best enjoyed in a healthy way, by EATING the fruit.  Thanks for choosing [facility] we consider it a privelige to serve you.

## 2024-09-11 NOTE — Assessment & Plan Note (Signed)
 Wt Readings from Last 3 Encounters:  09/11/24 189 lb (85.7 kg)  05/13/24 186 lb (84.4 kg)  04/17/24 188 lb 6.4 oz (85.5 kg)   Body mass index is 32.44 kg/m.   BMI is 32.4. Discussed dietary modifications and exercise for weight management. - Encourage a diet with 70-80% vegetables and proteins. - Advise reducing intake of carbohydrates, sugary drinks, and sodas. - Recommend moderate to vigorous exercise, 30 minutes, five days a week.

## 2024-09-11 NOTE — Progress Notes (Signed)
 Established Patient Office Visit  Subjective:  Patient ID: Yvette Franklin, female    DOB: [birth year]  Age: 53 y.o. MRN: [id number]  CC:  Chief Complaint  Patient presents with   Medical Management of Chronic Issues   Hypertension   Prediabetes    HPI    Discussed the use of AI scribe software for clinical note transcription with the patient, who gave verbal consent to proceed.  History of Present Illness Yvette Franklin is a 53 year old female  has a past medical history of Breast cancer (HCC), Complication of anesthesia, Family history of GYN cancer (02/20/2022), Hyperlipidemia (10/11/2023), Hypertension, Obesity (BMI 30.0-34.9) (03/01/2023), PONV (postoperative nausea and vomiting), and Prediabetes (10/11/2023).  who presents for a follow-up on her blood pressure management.  Her most recent blood pressure reading was 121/73 mmHg. She is currently taking losartan -hydrochlorothiazide  100/12.5 mg once daily. She did not take her medication on the day of the visit but confirms adherence to her regimen on other days.  She engages in physical activity by walking and exercising three to four times a week. She describes her diet as doing her best to maintain a healthy lifestyle, although specific dietary details were not provided. Her BMI is noted to be 32.4.  She has a history of prediabetes and takes a multivitamin daily. She recently started taking a supplement containing iron, zinc , and vitamin D  about a week ago. She has a history of anemia from two years ago but is unsure if she currently needs these supplements. She mentions experiencing hair thinning, which she feels improved with the multivitamin.  No fever, chills, chest pain, or shortness of breath. Her family is reported to be doing well.    Assessment & Plan    Past Medical History:  Diagnosis Date   Breast cancer (HCC)    left breast DCIS   Complication of anesthesia    Family history of GYN cancer 02/20/2022    Hyperlipidemia 10/11/2023   Hypertension    Obesity (BMI 30.0-34.9) 03/01/2023   PONV (postoperative nausea and vomiting)    Prediabetes 10/11/2023    Past Surgical History:  Procedure Laterality Date   BREAST BIOPSY Left 02/09/2022   BREAST BIOPSY Right 02/18/2022   BREAST RECONSTRUCTION WITH PLACEMENT OF TISSUE EXPANDER AND ALLODERM Left 04/22/2022   Procedure: BREAST RECONSTRUCTION WITH PLACEMENT OF TISSUE EXPANDER AND FLEX HD;  Surgeon: Lowery Estefana RAMAN, DO;  Location: [facility];  Service: Plastics;  Laterality: Left;   CHOLECYSTECTOMY     IRRIGATION AND DEBRIDEMENT ABDOMEN Left 05/16/2022   Procedure: IRRIGATION AND DEBRIDEMENT OF LEFT BREAST POCKET;  Surgeon: Marene Sieving, MD;  Location: [facility];  Service: Plastics;  Laterality: Left;   MASTECTOMY Left    MASTECTOMY W/ SENTINEL NODE BIOPSY Left 04/22/2022   Procedure: LEFT MASTECTOMY WITH SENTINEL LYMPH NODE BIOPSY;  Surgeon: Curvin Deward MOULD, MD;  Location: [facility];  Service: General;  Laterality: Left;   REMOVAL OF TISSUE EXPANDER AND PLACEMENT OF IMPLANT Left 05/16/2022   Procedure: REMOVAL OF TISSUE EXPANDER;  Surgeon: Marene Sieving, MD;  Location: [facility];  Service: Plastics;  Laterality: Left;    Family History  Problem Relation Age of Onset   Healthy Mother    Healthy Father    Cancer Maternal Aunt        GYN; ? ovarian   Cancer Cousin        unknown type; dx 11s   Breast cancer Neg Hx  Social History   Socioeconomic History   Marital status: Married    Spouse name: Not on file   Number of children: 3   Years of education: Not on file   Highest education level: Not on file  Occupational History   Not on file  Tobacco Use   Smoking status: Never   Smokeless tobacco: Never  Vaping Use   Vaping status: Never Used  Substance and Sexual Activity   Alcohol use: No   Drug use: No   Sexual activity: Yes    Birth control/protection: Post-menopausal    Comment: no  periods >12yrs  Other Topics Concern   Not on file  Social History Narrative   Lives with her husband and her children    Social Drivers of Corporate Investment Banker Strain: Not on file  Food Insecurity: Not on file  Transportation Needs: Not on file  Physical Activity: Not on file  Stress: Not on file  Social Connections: Unknown (03/31/2022)   Received from [facility]   Social Network    Social Network: Not on file  Intimate Partner Violence: Unknown (02/20/2022)   Received from [facility]   HITS    Physically Hurt: Not on file    Insult or Talk Down To: Not on file    Threaten Physical Harm: Not on file    Scream or Curse: Not on file    Outpatient Medications Prior to Visit  Medication Sig Dispense Refill   ketorolac  (ACULAR ) 0.5 % ophthalmic solution 1 drop 3 (three) times daily.     losartan -hydrochlorothiazide  (HYZAAR ) 100-12.5 MG tablet TAKE 1 TABLET BY MOUTH EVERY DAY 90 tablet 1   tretinoin (RETIN-A) 0.05 % cream Apply topically at bedtime.     hydrocortisone 2.5 % cream Apply 1 Application topically. (Patient not taking: Reported on 09/11/2024)     No facility-administered medications prior to visit.    Allergies  Allergen Reactions   Porcine (Pork) Protein-Containing Drug Products Other (See Comments)    Strictly no pork derived products- pt is Islam    ROS Review of Systems  Constitutional:  Negative for appetite change, chills, fatigue and fever.  HENT:  Negative for congestion, postnasal drip, rhinorrhea and sneezing.   Respiratory:  Negative for cough, shortness of breath and wheezing.   Cardiovascular:  Negative for chest pain, palpitations and leg swelling.  Gastrointestinal:  Negative for abdominal pain, constipation, nausea and vomiting.  Genitourinary:  Negative for difficulty urinating, dysuria, flank pain and frequency.  Musculoskeletal:  Negative for arthralgias, back pain, joint swelling and myalgias.  Skin:  Negative for color change,  pallor, rash and wound.  Neurological:  Negative for dizziness, facial asymmetry, weakness, numbness and headaches.  Psychiatric/Behavioral:  Negative for behavioral problems, confusion, self-injury and suicidal ideas.       Objective:    Physical Exam Vitals and nursing note reviewed.  Constitutional:      General: She is not in acute distress.    Appearance: Normal appearance. She is not ill-appearing, toxic-appearing or diaphoretic.  Eyes:     General: No scleral icterus.       Right eye: No discharge.        Left eye: No discharge.     Extraocular Movements: Extraocular movements intact.     Conjunctiva/sclera: Conjunctivae normal.  Cardiovascular:     Rate and Rhythm: Normal rate and regular rhythm.     Pulses: Normal pulses.     Heart sounds: Normal heart sounds. No murmur  heard.    No friction rub. No gallop.  Pulmonary:     Effort: Pulmonary effort is normal. No respiratory distress.     Breath sounds: Normal breath sounds. No stridor. No wheezing, rhonchi or rales.  Chest:     Chest wall: No tenderness.  Abdominal:     General: There is no distension.     Palpations: Abdomen is soft.     Tenderness: There is no abdominal tenderness. There is no right CVA tenderness, left CVA tenderness or guarding.  Musculoskeletal:        General: No swelling, tenderness, deformity or signs of injury.     Right lower leg: No edema.     Left lower leg: No edema.  Skin:    General: Skin is warm and dry.     Capillary Refill: Capillary refill takes less than 2 seconds.     Coloration: Skin is not jaundiced or pale.     Findings: No bruising, erythema or lesion.  Neurological:     Mental Status: She is alert and oriented to person, place, and time.     Motor: No weakness.     Gait: Gait normal.  Psychiatric:        Mood and Affect: Mood normal.        Behavior: Behavior normal.        Thought Content: Thought content normal.        Judgment: Judgment normal.     BP 121/73    Pulse 71   Wt 189 lb (85.7 kg)   LMP 11/15/2012 (Approximate)   SpO2 99%   BMI 32.44 kg/m  Wt Readings from Last 3 Encounters:  09/11/24 189 lb (85.7 kg)  05/13/24 186 lb (84.4 kg)  04/17/24 188 lb 6.4 oz (85.5 kg)    Lab Results  Component Value Date   TSH 1.920 03/01/2023   Lab Results  Component Value Date   WBC 5.1 03/06/2024   HGB 13.7 03/06/2024   HCT 42.2 03/06/2024   MCV 91 03/06/2024   PLT 279 03/06/2024   Lab Results  Component Value Date   NA 144 03/06/2024   K 4.0 03/06/2024   CO2 25 03/06/2024   GLUCOSE 88 03/06/2024   BUN 13 03/06/2024   CREATININE 0.78 03/06/2024   BILITOT 0.3 03/06/2024   ALKPHOS 68 03/06/2024   AST 18 03/06/2024   ALT 13 03/06/2024   PROT 6.7 03/06/2024   ALBUMIN 4.1 03/06/2024   CALCIUM  9.8 03/06/2024   ANIONGAP 11 05/20/2022   EGFR 91 03/06/2024   Lab Results  Component Value Date   CHOL 197 03/06/2024   Lab Results  Component Value Date   HDL 53 03/06/2024   Lab Results  Component Value Date   LDLCALC 127 (H) 03/06/2024   Lab Results  Component Value Date   TRIG 97 03/06/2024   Lab Results  Component Value Date   CHOLHDL 3.7 03/06/2024   Lab Results  Component Value Date   HGBA1C 5.8 (H) 03/06/2024      Assessment & Plan:   Problem List Items Addressed This Visit       Cardiovascular and Mediastinum   Essential hypertension - Primary   BP Readings from Last 3 Encounters:  09/11/24 121/73  05/13/24 127/88  04/17/24 117/67    Blood pressure controlled at 121/73 mmHg. - Continue losartan -hydrochlorothiazide  100-12.5 mg daily. - Encourage moderate exercise, 30 minutes, five days a week. - Recommend a heart-healthy, low-salt, low-fat diet.  Relevant Orders   CBC   Basic Metabolic Panel     Other   Obesity (BMI 30.0-34.9)   Wt Readings from Last 3 Encounters:  09/11/24 189 lb (85.7 kg)  05/13/24 186 lb (84.4 kg)  04/17/24 188 lb 6.4 oz (85.5 kg)   Body mass index is 32.44 kg/m.    BMI is 32.4. Discussed dietary modifications and exercise for weight management. - Encourage a diet with 70-80% vegetables and proteins. - Advise reducing intake of carbohydrates, sugary drinks, and sodas. - Recommend moderate to vigorous exercise, 30 minutes, five days a week.       Relevant Orders   VITAMIN D  25 Hydroxy (Vit-D Deficiency, Fractures)   Prediabetes   Lab Results  Component Value Date   HGBA1C 5.8 (H) 03/06/2024   Discussed management with emphasis on dietary changes and exercise. - Advise avoiding sugary foods and drinks, including sodas, bread, rice, and pasta. - Encourage regular exercise and healthy eating habits.        Health care maintenance   General Health Maintenance Declined flu shot despite recommendation. Due for cervical cancer screening after November 2025 - Encourage reconsideration of flu shot. - Recommend pneumonia vaccine for age over 86. - PAP smear at next visit  - Check kidney function and electrolytes. - Check vitamin D  levels and blood count to assess need for supplementation.       No orders of the defined types were placed in this encounter.   Follow-up: Return in about 6 months (around 03/12/2025) for CPE.    Kadarious Dikes R Chasidy Janak, FNP

## 2024-09-11 NOTE — Assessment & Plan Note (Signed)
 BP Readings from Last 3 Encounters:  09/11/24 121/73  05/13/24 127/88  04/17/24 117/67    Blood pressure controlled at 121/73 mmHg. - Continue losartan -hydrochlorothiazide  100-12.5 mg daily. - Encourage moderate exercise, 30 minutes, five days a week. - Recommend a heart-healthy, low-salt, low-fat diet.

## 2024-09-11 NOTE — Assessment & Plan Note (Signed)
 General Health Maintenance Declined flu shot despite recommendation. Due for cervical cancer screening after November 2025 - Encourage reconsideration of flu shot. - Recommend pneumonia vaccine for age over 4. - PAP smear at next visit  - Check kidney function and electrolytes. - Check vitamin D  levels and blood count to assess need for supplementation.

## 2024-09-11 NOTE — Assessment & Plan Note (Signed)
 Lab Results  Component Value Date   HGBA1C 5.8 (H) 03/06/2024   Discussed management with emphasis on dietary changes and exercise. - Advise avoiding sugary foods and drinks, including sodas, bread, rice, and pasta. - Encourage regular exercise and healthy eating habits.

## 2024-09-12 ENCOUNTER — Ambulatory Visit: Payer: Self-pay | Admitting: Nurse Practitioner

## 2024-09-12 LAB — CBC
Hematocrit: 41.8 % (ref 34.0–46.6)
Hemoglobin: 13.7 g/dL (ref 11.1–15.9)
MCH: 30.4 pg (ref 26.6–33.0)
MCHC: 32.8 g/dL (ref 31.5–35.7)
MCV: 93 fL (ref 79–97)
Platelets: 309 x10E3/uL (ref 150–450)
RBC: 4.51 x10E6/uL (ref 3.77–5.28)
RDW: 12.6 % (ref 11.7–15.4)
WBC: 5.5 x10E3/uL (ref 3.4–10.8)

## 2024-09-12 LAB — BASIC METABOLIC PANEL WITH GFR
BUN/Creatinine Ratio: 16 (ref 9–23)
BUN: 12 mg/dL (ref 6–24)
CO2: 25 mmol/L (ref 20–29)
Calcium: 10.2 mg/dL (ref 8.7–10.2)
Chloride: 103 mmol/L (ref 96–106)
Creatinine, Ser: 0.77 mg/dL (ref 0.57–1.00)
Glucose: 84 mg/dL (ref 70–99)
Potassium: 4.2 mmol/L (ref 3.5–5.2)
Sodium: 141 mmol/L (ref 134–144)
eGFR: 92 mL/min/1.73 (ref >59)

## 2024-09-12 LAB — VITAMIN D 25 HYDROXY (VIT D DEFICIENCY, FRACTURES): Vit D, 25-Hydroxy: 39.3 ng/mL (ref 30.0–100.0)

## 2024-11-20 ENCOUNTER — Other Ambulatory Visit (HOSPITAL_COMMUNITY): Payer: Self-pay

## 2024-11-21 ENCOUNTER — Other Ambulatory Visit (HOSPITAL_COMMUNITY): Payer: Self-pay

## 2024-11-21 ENCOUNTER — Other Ambulatory Visit: Payer: Self-pay

## 2024-11-21 MED ORDER — TRIAMCINOLONE ACETONIDE 0.025 % EX CREA: TOPICAL_CREAM | Freq: Two times a day (BID) | CUTANEOUS | 0 refills | Status: AC

## 2024-11-21 MED FILL — Losartan Potassium & Hydrochlorothiazide Tab 100-12.5 MG: ORAL | 90 days supply | Qty: 90 | Fill #0 | Status: AC

## 2024-11-22 ENCOUNTER — Other Ambulatory Visit (HOSPITAL_COMMUNITY): Payer: Self-pay

## 2024-11-22 ENCOUNTER — Other Ambulatory Visit: Payer: Self-pay

## 2024-11-22 MED ORDER — HYDROCORTISONE 2.5 % EX CREA
TOPICAL_CREAM | CUTANEOUS | 2 refills | Status: AC
  Filled 2024-11-22: qty 30, 30d supply, fill #0

## 2024-11-22 MED ORDER — HYDROQUINONE 4 % EX CREA
TOPICAL_CREAM | CUTANEOUS | 1 refills | Status: AC
  Filled 2024-11-22: qty 28.35, 30d supply, fill #0

## 2024-11-22 MED ORDER — TRI-LUMA 0.01-4-0.05 % EX CREA
TOPICAL_CREAM | CUTANEOUS | 0 refills | Status: AC
  Filled 2024-11-22: qty 30, 30d supply, fill #0

## 2024-11-23 ENCOUNTER — Encounter: Payer: Self-pay | Admitting: Pharmacist

## 2024-11-23 ENCOUNTER — Other Ambulatory Visit: Payer: Self-pay

## 2024-11-28 ENCOUNTER — Other Ambulatory Visit: Payer: Self-pay

## 2024-12-25 ENCOUNTER — Ambulatory Visit: Admitting: Dermatology

## 2025-03-13 ENCOUNTER — Encounter: Payer: Self-pay | Admitting: Nurse Practitioner

## 2025-04-23 ENCOUNTER — Inpatient Hospital Stay

## 2025-04-23 ENCOUNTER — Encounter: Admitting: Adult Health
